# Patient Record
Sex: Male | Born: 1965
Health system: Southern US, Community
[De-identification: ages and names within clinical notes are randomized; demographics above are authoritative.]

## PROBLEM LIST (undated history)

## (undated) DIAGNOSIS — I255 Ischemic cardiomyopathy: Secondary | ICD-10-CM

## (undated) DIAGNOSIS — Z720 Tobacco use: Secondary | ICD-10-CM

## (undated) DIAGNOSIS — J449 Chronic obstructive pulmonary disease, unspecified: Secondary | ICD-10-CM

## (undated) DIAGNOSIS — K219 Gastro-esophageal reflux disease without esophagitis: Secondary | ICD-10-CM

## (undated) DIAGNOSIS — I219 Acute myocardial infarction, unspecified: Secondary | ICD-10-CM

## (undated) DIAGNOSIS — G4733 Obstructive sleep apnea (adult) (pediatric): Secondary | ICD-10-CM

## (undated) DIAGNOSIS — K449 Diaphragmatic hernia without obstruction or gangrene: Secondary | ICD-10-CM

## (undated) DIAGNOSIS — I2510 Atherosclerotic heart disease of native coronary artery without angina pectoris: Secondary | ICD-10-CM

## (undated) DIAGNOSIS — F1210 Cannabis abuse, uncomplicated: Secondary | ICD-10-CM

## (undated) DIAGNOSIS — I10 Essential (primary) hypertension: Secondary | ICD-10-CM

## (undated) HISTORY — DX: Acute myocardial infarction, unspecified: I21.9

## (undated) HISTORY — PX: DENTAL SURGERY: SHX609

## (undated) HISTORY — DX: Chronic obstructive pulmonary disease, unspecified: J44.9

## (undated) HISTORY — PX: NASAL SEPTUM SURGERY: SHX37

---

## 2002-12-23 ENCOUNTER — Ambulatory Visit (HOSPITAL_COMMUNITY): Admission: RE | Admit: 2002-12-23 | Discharge: 2002-12-23 | Payer: Self-pay | Admitting: Pulmonary Disease

## 2002-12-29 ENCOUNTER — Ambulatory Visit (HOSPITAL_COMMUNITY): Admission: RE | Admit: 2002-12-29 | Discharge: 2002-12-29 | Payer: Self-pay | Admitting: Pulmonary Disease

## 2003-01-26 ENCOUNTER — Ambulatory Visit (HOSPITAL_COMMUNITY): Admission: RE | Admit: 2003-01-26 | Discharge: 2003-01-26 | Payer: Self-pay | Admitting: Internal Medicine

## 2003-02-18 ENCOUNTER — Ambulatory Visit (HOSPITAL_COMMUNITY): Admission: RE | Admit: 2003-02-18 | Discharge: 2003-02-18 | Payer: Self-pay | Admitting: Internal Medicine

## 2003-02-20 ENCOUNTER — Ambulatory Visit (HOSPITAL_COMMUNITY): Admission: RE | Admit: 2003-02-20 | Discharge: 2003-02-20 | Payer: Self-pay | Admitting: Internal Medicine

## 2012-03-27 DIAGNOSIS — I219 Acute myocardial infarction, unspecified: Secondary | ICD-10-CM

## 2012-03-27 HISTORY — DX: Acute myocardial infarction, unspecified: I21.9

## 2013-01-13 ENCOUNTER — Encounter (HOSPITAL_COMMUNITY)
Admission: EM | Disposition: A | Discharge status: Home / Self Care | Payer: Self-pay | Source: Home / Self Care | Attending: Cardiovascular Disease

## 2013-01-13 ENCOUNTER — Ambulatory Visit (HOSPITAL_COMMUNITY): Admit: 2013-01-13 | Payer: Self-pay | Admitting: Cardiovascular Disease

## 2013-01-13 ENCOUNTER — Inpatient Hospital Stay (HOSPITAL_COMMUNITY): Payer: Managed Care, Other (non HMO)

## 2013-01-13 ENCOUNTER — Inpatient Hospital Stay (HOSPITAL_COMMUNITY)
Admission: EM | Admit: 2013-01-13 | Discharge: 2013-01-17 | DRG: 237 | Disposition: A | Discharge status: Home / Self Care | Payer: Managed Care, Other (non HMO) | Attending: Cardiovascular Disease | Admitting: Cardiovascular Disease

## 2013-01-13 ENCOUNTER — Emergency Department (HOSPITAL_COMMUNITY): Payer: Managed Care, Other (non HMO)

## 2013-01-13 DIAGNOSIS — Z79899 Other long term (current) drug therapy: Secondary | ICD-10-CM

## 2013-01-13 DIAGNOSIS — I472 Ventricular tachycardia, unspecified: Secondary | ICD-10-CM | POA: Diagnosis present

## 2013-01-13 DIAGNOSIS — Z720 Tobacco use: Secondary | ICD-10-CM | POA: Diagnosis present

## 2013-01-13 DIAGNOSIS — I4901 Ventricular fibrillation: Secondary | ICD-10-CM

## 2013-01-13 DIAGNOSIS — F17200 Nicotine dependence, unspecified, uncomplicated: Secondary | ICD-10-CM | POA: Diagnosis present

## 2013-01-13 DIAGNOSIS — I959 Hypotension, unspecified: Secondary | ICD-10-CM

## 2013-01-13 DIAGNOSIS — I4729 Other ventricular tachycardia: Secondary | ICD-10-CM | POA: Diagnosis present

## 2013-01-13 DIAGNOSIS — Z23 Encounter for immunization: Secondary | ICD-10-CM

## 2013-01-13 DIAGNOSIS — I519 Heart disease, unspecified: Secondary | ICD-10-CM | POA: Insufficient documentation

## 2013-01-13 DIAGNOSIS — I25110 Atherosclerotic heart disease of native coronary artery with unstable angina pectoris: Secondary | ICD-10-CM

## 2013-01-13 DIAGNOSIS — I2119 ST elevation (STEMI) myocardial infarction involving other coronary artery of inferior wall: Secondary | ICD-10-CM

## 2013-01-13 DIAGNOSIS — I10 Essential (primary) hypertension: Secondary | ICD-10-CM | POA: Diagnosis present

## 2013-01-13 DIAGNOSIS — Z7982 Long term (current) use of aspirin: Secondary | ICD-10-CM

## 2013-01-13 DIAGNOSIS — I2510 Atherosclerotic heart disease of native coronary artery without angina pectoris: Secondary | ICD-10-CM

## 2013-01-13 DIAGNOSIS — R570 Cardiogenic shock: Secondary | ICD-10-CM

## 2013-01-13 DIAGNOSIS — I469 Cardiac arrest, cause unspecified: Secondary | ICD-10-CM

## 2013-01-13 DIAGNOSIS — J9620 Acute and chronic respiratory failure, unspecified whether with hypoxia or hypercapnia: Secondary | ICD-10-CM | POA: Diagnosis present

## 2013-01-13 DIAGNOSIS — F1210 Cannabis abuse, uncomplicated: Secondary | ICD-10-CM | POA: Diagnosis present

## 2013-01-13 HISTORY — PX: PERCUTANEOUS CORONARY STENT INTERVENTION (PCI-S): SHX5485

## 2013-01-13 HISTORY — DX: Tobacco use: Z72.0

## 2013-01-13 HISTORY — DX: Obstructive sleep apnea (adult) (pediatric): G47.33

## 2013-01-13 HISTORY — DX: Ischemic cardiomyopathy: I25.5

## 2013-01-13 HISTORY — DX: Diaphragmatic hernia without obstruction or gangrene: K44.9

## 2013-01-13 HISTORY — DX: Cardiogenic shock: R57.0

## 2013-01-13 HISTORY — PX: CORONARY ANGIOPLASTY: SHX604

## 2013-01-13 HISTORY — DX: Essential (primary) hypertension: I10

## 2013-01-13 HISTORY — DX: Gastro-esophageal reflux disease without esophagitis: K21.9

## 2013-01-13 HISTORY — PX: LEFT HEART CATHETERIZATION WITH CORONARY ANGIOGRAM: SHX5451

## 2013-01-13 HISTORY — DX: Atherosclerotic heart disease of native coronary artery without angina pectoris: I25.10

## 2013-01-13 HISTORY — DX: Cannabis abuse, uncomplicated: F12.10

## 2013-01-13 LAB — CBC
HCT: 41.1 % (ref 39.0–52.0)
Hemoglobin: 14 g/dL (ref 13.0–17.0)
MCH: 33.8 pg (ref 26.0–34.0)
MCHC: 34.1 g/dL (ref 30.0–36.0)
MCV: 99.3 fL (ref 78.0–100.0)
Platelets: 230 10*3/uL (ref 150–400)
RBC: 4.14 MIL/uL — ABNORMAL LOW (ref 4.22–5.81)
RDW: 13.4 % (ref 11.5–15.5)
WBC: 20.3 10*3/uL — ABNORMAL HIGH (ref 4.0–10.5)

## 2013-01-13 LAB — POCT I-STAT 3, ART BLOOD GAS (G3+)
Acid-base deficit: 5 mmol/L — ABNORMAL HIGH (ref 0.0–2.0)
Bicarbonate: 22 mEq/L (ref 20.0–24.0)
O2 Saturation: 99 %
Patient temperature: 97.4
TCO2: 23 mmol/L (ref 0–100)
pCO2 arterial: 46.6 mmHg — ABNORMAL HIGH (ref 35.0–45.0)
pH, Arterial: 7.279 — ABNORMAL LOW (ref 7.350–7.450)
pO2, Arterial: 171 mmHg — ABNORMAL HIGH (ref 80.0–100.0)

## 2013-01-13 LAB — GLUCOSE, CAPILLARY
Glucose-Capillary: 100 mg/dL — ABNORMAL HIGH (ref 70–99)
Glucose-Capillary: 109 mg/dL — ABNORMAL HIGH (ref 70–99)

## 2013-01-13 LAB — BASIC METABOLIC PANEL
BUN: 11 mg/dL (ref 6–23)
CO2: 24 mEq/L (ref 19–32)
Calcium: 7.9 mg/dL — ABNORMAL LOW (ref 8.4–10.5)
Chloride: 104 mEq/L (ref 96–112)
Creatinine, Ser: 0.74 mg/dL (ref 0.50–1.35)
GFR calc Af Amer: >90 mL/min (ref >90)
GFR calc non Af Amer: >90 mL/min (ref >90)
Glucose, Bld: 97 mg/dL (ref 70–99)
Potassium: 4.3 mEq/L (ref 3.5–5.1)
Sodium: 139 mEq/L (ref 135–145)

## 2013-01-13 LAB — LIPID PANEL
Cholesterol: 148 mg/dL (ref 0–200)
HDL: 31 mg/dL — ABNORMAL LOW (ref >39)
LDL Cholesterol: 81 mg/dL (ref 0–99)
Total CHOL/HDL Ratio: 4.8 RATIO
Triglycerides: 178 mg/dL — ABNORMAL HIGH (ref <150)
VLDL: 36 mg/dL (ref 0–40)

## 2013-01-13 LAB — BLOOD GAS, ARTERIAL
Acid-base deficit: 2.4 mmol/L — ABNORMAL HIGH (ref 0.0–2.0)
Bicarbonate: 22.6 mEq/L (ref 20.0–24.0)
Drawn by: 249101
FIO2: 0.5 %
MECHVT: 550 mL
O2 Saturation: 98.9 %
PEEP: 5 cmH2O
Patient temperature: 98.6
RATE: 16 resp/min
TCO2: 23.9 mmol/L (ref 0–100)
pCO2 arterial: 43.5 mmHg (ref 35.0–45.0)
pH, Arterial: 7.335 — ABNORMAL LOW (ref 7.350–7.450)
pO2, Arterial: 131 mmHg — ABNORMAL HIGH (ref 80.0–100.0)

## 2013-01-13 LAB — CK TOTAL AND CKMB (NOT AT ARMC)
CK, MB: 416.8 ng/mL (ref 0.3–4.0)
CK, MB: 353.4 ng/mL (ref 0.3–4.0)
Relative Index: 4.8 — ABNORMAL HIGH (ref 0.0–2.5)
Relative Index: 2.9 — ABNORMAL HIGH (ref 0.0–2.5)
Total CK: 8769 U/L — ABNORMAL HIGH (ref 7–232)
Total CK: 12244 U/L — ABNORMAL HIGH (ref 7–232)

## 2013-01-13 LAB — HEPARIN LEVEL (UNFRACTIONATED): Heparin Unfractionated: 0.25 IU/mL — ABNORMAL LOW (ref 0.30–0.70)

## 2013-01-13 LAB — POCT ACTIVATED CLOTTING TIME: Activated Clotting Time: 442 seconds

## 2013-01-13 LAB — TROPONIN I: Troponin I: >20 ng/mL (ref <0.30)

## 2013-01-13 LAB — MRSA PCR SCREENING: MRSA by PCR: NEGATIVE

## 2013-01-13 SURGERY — LEFT HEART CATHETERIZATION WITH CORONARY ANGIOGRAM
Anesthesia: LOCAL

## 2013-01-13 MED ORDER — TICAGRELOR 90 MG PO TABS
90.0000 mg | ORAL_TABLET | Freq: Two times a day (BID) | ORAL | Status: DC
  Administered 2013-01-13 – 2013-01-17 (×8): 90 mg via ORAL
  Filled 2013-01-13 (×9): qty 1

## 2013-01-13 MED ORDER — SODIUM CHLORIDE 0.9 % IV SOLN: INTRAVENOUS | Status: DC

## 2013-01-13 MED ORDER — TICAGRELOR 90 MG PO TABS
ORAL_TABLET | ORAL | Status: AC
  Filled 2013-01-13: qty 2

## 2013-01-13 MED ORDER — BIOTENE DRY MOUTH MT LIQD
15.0000 mL | Freq: Four times a day (QID) | OROMUCOSAL | Status: DC
  Administered 2013-01-13 – 2013-01-14 (×4): 15 mL via OROMUCOSAL

## 2013-01-13 MED ORDER — ALBUTEROL SULFATE (5 MG/ML) 0.5% IN NEBU
2.5000 mg | INHALATION_SOLUTION | Freq: Four times a day (QID) | RESPIRATORY_TRACT | Status: DC
  Administered 2013-01-13 – 2013-01-14 (×6): 2.5 mg via RESPIRATORY_TRACT
  Filled 2013-01-13 (×6): qty 0.5

## 2013-01-13 MED ORDER — ATORVASTATIN CALCIUM 80 MG PO TABS
80.0000 mg | ORAL_TABLET | Freq: Every day | ORAL | Status: DC
  Administered 2013-01-13 – 2013-01-16 (×4): 80 mg via ORAL
  Filled 2013-01-13 (×5): qty 1

## 2013-01-13 MED ORDER — ONDANSETRON HCL 4 MG/2ML IJ SOLN: 4.0000 mg | Freq: Four times a day (QID) | INTRAMUSCULAR | Status: DC | PRN

## 2013-01-13 MED ORDER — IPRATROPIUM BROMIDE 0.02 % IN SOLN
0.5000 mg | Freq: Four times a day (QID) | RESPIRATORY_TRACT | Status: DC
  Administered 2013-01-13 – 2013-01-14 (×6): 0.5 mg via RESPIRATORY_TRACT
  Filled 2013-01-13 (×6): qty 2.5

## 2013-01-13 MED ORDER — CHLORHEXIDINE GLUCONATE 0.12 % MT SOLN
15.0000 mL | Freq: Two times a day (BID) | OROMUCOSAL | Status: DC
  Administered 2013-01-13 – 2013-01-14 (×3): 15 mL via OROMUCOSAL
  Filled 2013-01-13 (×3): qty 15

## 2013-01-13 MED ORDER — HEPARIN (PORCINE) IN NACL 2-0.9 UNIT/ML-% IJ SOLN
INTRAMUSCULAR | Status: AC
  Filled 2013-01-13: qty 1000

## 2013-01-13 MED ORDER — PANTOPRAZOLE SODIUM 40 MG IV SOLR
40.0000 mg | Freq: Every day | INTRAVENOUS | Status: DC
  Administered 2013-01-13 – 2013-01-14 (×2): 40 mg via INTRAVENOUS
  Filled 2013-01-13 (×3): qty 40

## 2013-01-13 MED ORDER — SUCCINYLCHOLINE CHLORIDE 20 MG/ML IJ SOLN
INTRAMUSCULAR | Status: AC | PRN
  Administered 2013-01-13: 120 mg via INTRAVENOUS

## 2013-01-13 MED ORDER — CEFAZOLIN SODIUM-DEXTROSE 2-3 GM-% IV SOLR
2.0000 g | INTRAVENOUS | Status: AC
  Administered 2013-01-13: 2 g via INTRAVENOUS
  Filled 2013-01-13: qty 50

## 2013-01-13 MED ORDER — LIDOCAINE HCL (PF) 1 % IJ SOLN
INTRAMUSCULAR | Status: AC
  Filled 2013-01-13: qty 30

## 2013-01-13 MED ORDER — BIVALIRUDIN 250 MG IV SOLR
INTRAVENOUS | Status: AC
  Filled 2013-01-13: qty 250

## 2013-01-13 MED ORDER — SODIUM CHLORIDE 0.9 % IV SOLN
2.0000 mg/h | INTRAVENOUS | Status: DC
  Administered 2013-01-13: 2 mg/h via INTRAVENOUS
  Filled 2013-01-13 (×2): qty 10

## 2013-01-13 MED ORDER — SODIUM CHLORIDE 0.9 % IV SOLN
INTRAVENOUS | Status: AC
  Administered 2013-01-13: 1000 mL via INTRAVENOUS
  Administered 2013-01-13 (×2): 100 mL/h via INTRAVENOUS

## 2013-01-13 MED ORDER — MAGNESIUM SULFATE 50 % IJ SOLN
INTRAMUSCULAR | Status: AC | PRN
  Administered 2013-01-13: 2 g via INTRAVENOUS

## 2013-01-13 MED ORDER — PROPOFOL 10 MG/ML IV EMUL
5.0000 ug/kg/min | INTRAVENOUS | Status: DC
  Administered 2013-01-13: 40 ug/kg/min via INTRAVENOUS
  Administered 2013-01-13: 30 ug/kg/min via INTRAVENOUS
  Administered 2013-01-13: 40 ug/kg/min via INTRAVENOUS
  Administered 2013-01-14 (×2): 30 ug/kg/min via INTRAVENOUS
  Filled 2013-01-13 (×6): qty 100

## 2013-01-13 MED ORDER — MIDAZOLAM HCL 5 MG/5ML IJ SOLN
INTRAMUSCULAR | Status: DC | PRN
  Administered 2013-01-13: 2 mg via INTRAVENOUS

## 2013-01-13 MED ORDER — ETOMIDATE 2 MG/ML IV SOLN
INTRAVENOUS | Status: AC | PRN
  Administered 2013-01-13: 20 mg via INTRAVENOUS

## 2013-01-13 MED ORDER — ATROPINE SULFATE 1 MG/ML IJ SOLN
INTRAMUSCULAR | Status: DC | PRN
  Administered 2013-01-13: 1 mg via INTRAVENOUS

## 2013-01-13 MED ORDER — SODIUM CHLORIDE 0.9 % IV SOLN
25.0000 ug/h | INTRAVENOUS | Status: DC
  Administered 2013-01-13: 100 ug/h via INTRAVENOUS
  Filled 2013-01-13: qty 50

## 2013-01-13 MED ORDER — ASPIRIN 81 MG PO CHEW
81.0000 mg | CHEWABLE_TABLET | Freq: Every day | ORAL | Status: DC
  Administered 2013-01-13 – 2013-01-17 (×5): 81 mg via ORAL
  Filled 2013-01-13 (×5): qty 1

## 2013-01-13 MED ORDER — DEXTROSE 5 % IV SOLN: 300.0000 mg/h | Freq: Once | INTRAVENOUS | Status: DC

## 2013-01-13 MED ORDER — DEXTROSE 5 % IV SOLN
300.0000 mg | Freq: Once | INTRAVENOUS | Status: AC
  Administered 2013-01-13: 300 mg via INTRAVENOUS

## 2013-01-13 MED ORDER — HEPARIN (PORCINE) IN NACL 100-0.45 UNIT/ML-% IJ SOLN
1200.0000 [IU]/h | INTRAMUSCULAR | Status: DC
  Administered 2013-01-13 (×2): 1200 [IU]/h via INTRAVENOUS
  Filled 2013-01-13 (×5): qty 250

## 2013-01-13 MED ORDER — FENTANYL BOLUS VIA INFUSION
25.0000 ug | Freq: Four times a day (QID) | INTRAVENOUS | Status: DC | PRN
  Filled 2013-01-13: qty 100

## 2013-01-13 MED ORDER — DOPAMINE-DEXTROSE 3.2-5 MG/ML-% IV SOLN
2.0000 ug/kg/min | Freq: Once | INTRAVENOUS | Status: DC
  Filled 2013-01-13: qty 250

## 2013-01-13 MED ORDER — NITROGLYCERIN 0.2 MG/ML ON CALL CATH LAB
INTRAVENOUS | Status: AC
  Filled 2013-01-13: qty 1

## 2013-01-13 MED ORDER — PROPOFOL 10 MG/ML IV EMUL
INTRAVENOUS | Status: AC
  Filled 2013-01-13: qty 100

## 2013-01-13 MED ORDER — DOPAMINE-DEXTROSE 3.2-5 MG/ML-% IV SOLN
INTRAVENOUS | Status: DC | PRN
  Administered 2013-01-13: 10 ug/kg/min via INTRAVENOUS
  Administered 2013-01-13: 800 mg via INTRAVENOUS

## 2013-01-13 MED ORDER — HYDROCORTISONE SOD SUCCINATE 100 MG IJ SOLR
50.0000 mg | Freq: Four times a day (QID) | INTRAMUSCULAR | Status: DC
  Administered 2013-01-13 – 2013-01-15 (×8): 50 mg via INTRAVENOUS
  Filled 2013-01-13 (×12): qty 1

## 2013-01-13 MED FILL — Medication: Qty: 1 | Status: AC

## 2013-01-13 NOTE — Progress Notes (Signed)
*   Echocardiogram 2D Echocardiogram has been performed.  Dorothey Baseman 01/13/2013, 1:14 PM

## 2013-01-13 NOTE — Progress Notes (Signed)
Called cardiology Dr. On call to report pt having episodes of Sinus Brady, rate as low as 40. Reported that I increased the dopamine dose, and that HR is not sustained in the 40's, but does occasionally drop for several minutes at a time before returning to the 50's-60's. Will continue to monitor.

## 2013-01-13 NOTE — Consult Note (Signed)
PULMONARY  / CRITICAL CARE MEDICINE  Name: Timothy Walker MRN: [id number] DOB: 1966-01-16    ADMISSION DATE:  01/13/2013 CONSULTATION DATE:  10-20  REFERRING MD :  Kirke Corin PRIMARY SERVICE: Cards  CHIEF COMPLAINT:  Substernal chest pain  BRIEF PATIENT DESCRIPTION:  47 yo smoker who presented to [facility] in [place] 10-20 with chest pain and 12 lead revealed a inferior MI. He was given asa and heparin and transported to [facility] ED. He promptly developed refractory VT, shocked x 15, intubated and taken to Cath Lab and RCA was stented. He had profound shock and required pressors and IABP. PCCM asked to manage vent. SIGNIFICANT EVENTS / STUDIES:  10-20 VT arrest RCA thrombus and  Stent placement  LINES / TUBES: 10-20 rt fem IABP>>  CULTURES:   ANTIBIOTICS:   HISTORY OF PRESENT ILLNESS:   47 yo smoker who presented to [facility] in [place] 10-20 with chest pain and 12 lead revealed a inferior MI. He was given asa and heparin and transported to [facility] ED. He promptly developed refractory VT, shocked x 15, intubated and taken to Cath Lab and RCA was stented. He had profound shock and required pressors and IABP. PCCM asked to manage vent.  PAST MEDICAL HISTORY :  No past medical history on file. No past surgical history on file. Prior to Admission medications   Not on File   No Known Allergies  FAMILY HISTORY:  No family history on file. SOCIAL HISTORY:  has no tobacco, alcohol, and drug history on file.  REVIEW OF SYSTEMS:  NA  SUBJECTIVE:   VITAL SIGNS: Temp:  [97.5 F (36.4 C)] 97.5 F (36.4 C) (10/20 0600) Pulse Rate:  [29-99] 78 (10/20 0839) Resp:  [16-27] 16 (10/20 0839) BP: (59-159)/(48-123) 106/78 mmHg (10/20 0839) SpO2:  [95 %-100 %] 100 % (10/20 0839) FiO2 (%):  [50 %-100 %] 50 % (10/20 0839) Weight:  [195 lb (88.451 kg)] 195 lb (88.451 kg) (10/20 0322) HEMODYNAMICS:   VENTILATOR SETTINGS: Vent Mode:  [-] PRVC FiO2 (%):  [50 %-100 %] 50 % Set  Rate:  [16 bmp] 16 bmp Vt Set:  [550 mL] 550 mL PEEP:  [5 cmH20] 5 cmH20 Plateau Pressure:  [16 cmH20-18 cmH20] 16 cmH20 INTAKE / OUTPUT: Intake/Output     10/19 0701 - 10/20 0700 10/20 0701 - 10/21 0700   I.V. (mL/kg) 117.8 (1.3) 119 (1.3)   IV Piggyback 100    Total Intake(mL/kg) 217.8 (2.5) 119 (1.3)   Urine (mL/kg/hr) 975 250 (1.5)   Total Output 975 250   Net -757.2 -131.1          PHYSICAL EXAMINATION: General:  WDWNWM sedated on vent Neuro: sedated on vent. No down time HEENT:  Ott-<vent                 OGT-> clamped Cardiovascular: HSR RR IABP sound noted Lungs:  CTA Abdomen:  Faint bs, soft Musculoskeletal:  Rt fem iabp, rt foot cooler than left Skin:  cool  LABS:  CBC No results found for this basename: WBC, HGB, HCT, PLT,  in the last 72 hours Coag's No results found for this basename: APTT, INR,  in the last 72 hours BMET Recent Labs     01/13/13  0715  NA  139  K  4.3  CL  104  CO2  24  BUN  11  CREATININE  0.74  GLUCOSE  97   Electrolytes Recent Labs     01/13/13  0715  CALCIUM  7.9*   Sepsis Markers No results found for this basename: LACTICACIDVEN, PROCALCITON, O2SATVEN,  in the last 72 hours ABG Recent Labs     01/13/13  0622  PHART  7.279*  PCO2ART  46.6*  PO2ART  171.0*   Liver Enzymes No results found for this basename: AST, ALT, ALKPHOS, BILITOT, ALBUMIN,  in the last 72 hours Cardiac Enzymes No results found for this basename: TROPONINI, PROBNP,  in the last 72 hours Glucose No results found for this basename: GLUCAP,  in the last 72 hours  Imaging Portable Chest Xray  01/13/2013   *RADIOLOGY REPORT*  Clinical Data: Cardiac arrest, new intubation.  PORTABLE CHEST - 1 VIEW  Comparison: Chest radiograph January 13, 2013 at 03:15 a.m.  Findings: Cardiac silhouette appears mildly enlarged.  Mild bibasilar air space opacities with air bronchograms in the left retrocardiac space.  Small left pleural effusion.  Endotracheal tube  tip projects 2.7 cm above the carina. Nasogastric tube looped in the stomach, distal tip projecting in the cardia.  A linear 12 mm radiopaque foreign body projects in the descending aorta.  Soft tissue planes and included osseous structures are not suspicious.  IMPRESSION: Borderline cardiomegaly, with increasing mild bibasilar air space opacities, small retrocardiac air bronchogram which may reflect pneumonia/aspiration.  Small left pleural effusion.  Endotracheal tube tip projects 2.7 cm above the carina, similar. Nasogastric tube tip projects in the gastric cardia.  Linear radiopaque foreign bodies projecting in the descending aorta suggest aortic balloon pump projecting 5 cm below the aortic knob.   Original Report Authenticated By: Awilda Metro   Dg Chest Portable 1 View  01/13/2013   *RADIOLOGY REPORT*  Clinical Data: Post CPR  PORTABLE CHEST - 1 VIEW  Comparison: Chest radiograph January 13, 2013 at 0213 hours  Findings: Interval intubation, distal tip projects 2.5 cm above the carina.  Nasogastric tube in place, distal tip not imaged at least past the distal esophagus.  Borderline cardiomegaly.  Increasing mild central pulmonary vascular congestion and mild interstitial prominence without pleural effusions or focal consolidations.  No pneumothorax.  Multiple EKG lines overlay the patient and could obscure underlying subtle pathology.  Soft tissue planes and included osseous structures are not suspicious.  IMPRESSION: Endotracheal tube tip projects 2.5 cm above the carina; nasogastric tube past the distal esophagus though, tip not imaged.  Borderline cardiomegaly with increasing mild to moderate pulmonary edema.   Original Report Authenticated By: Awilda Metro     CXR: see above  Active Problems:   Paroxysmal ventricular tachycardia   Acute and chronic respiratory failure   Tobacco abuse   Heart disease, unspecified   Cardiogenic shock   ASSESSMENT / PLAN:  PULMONARY A: VDRF due  to agitation and shock Tobacco abuse P:   - Hold weaning while hypotensive and with IABP. - BD's as ordered. - Adjust vent for ABG. - ABG now. - Smoking cessation post extubation.  CARDIOVASCULAR A:  Post VT arrest with CC and stent of RCA Cardiogenic shock P:  - Pressors per cards (no cvl), switch dopa to venous sheath and wean - IABP per cards, will attempt to remove by AM. - Consider CVL(will need heparin off x 30 mins) but will not place while on low dose dopamine and sheath is present. - ? A line if shock prolonged. - Heparin per cards.  RENAL Lab Results  Component Value Date   CREATININE 0.74 01/13/2013    A:  No acute issue  P:   -  IVF as ordered. - BMET in AM.  GASTROINTESTINAL A: GI protection P:   - PPI - NPO, if not extubatable by AM then will start TF.  HEMATOLOGIC A:  No acute issue  P:  - CBC in AM.  INFECTIOUS A:  No acute issue  P:   - Monitor fever curve and WBC, no abx for now.  ENDOCRINE A:  No acute issue  P:   - Monitor CBGs. - Check cortisol level. - Replace with stress dose steroids, but d/c if level is elevated.  NEUROLOGIC A:  Sedated on vent, agitated prior to CC. P:   - WUA. - CT head if AMS on wake up assessment.  TODAY'S SUMMARY: Check gas, adjust vent, no weaning until more hemodynamically stable.  47 yo smoker who presented to [facility] in [place] 10-20 with chest pain and 12 lead revealed a inferior MI. He was given asa and heparin and transported to [facility] ED. He promptly developed refractory VT, shocked x 15, intubated and taken to Cath Lab and RCA was stented. He had profound shock and required pressors and IABP. PCCM asked to manage vent.  CC time 45 minutes.  Alyson Reedy, M.D. [facility] Pulmonary/Critical Care Medicine. Pager: [phone]. After hours pager: [phone].

## 2013-01-13 NOTE — Progress Notes (Signed)
    Subjective:  Intubated and sedated.  Objective:  Vital Signs in the last 24 hours: Temp:  [97.5 F (36.4 C)] 97.5 F (36.4 C) (10/20 0600) Pulse Rate:  [29-99] 79 (10/20 0700) Resp:  [16-27] 16 (10/20 0700) BP: (59-159)/(48-123) 119/86 mmHg (10/20 0700) SpO2:  [95 %-100 %] 100 % (10/20 0700) FiO2 (%):  [60 %-100 %] 60 % (10/20 0700) Weight:  [195 lb (88.451 kg)] 195 lb (88.451 kg) (10/20 0322)  Intake/Output from previous day: 10/19 0701 - 10/20 0700 In: 217.8 [I.V.:117.8; IV Piggyback:100] Out: 975 [Urine:975]  Physical Exam: Pt is intubated, sedated. Neck: JVP - normal, carotids 2+= without bruits Lungs: CTA bilaterally CV: RRR without murmur or gallop Abd: soft, positive BS Ext: no C/C/E, distal pulses intact and equal, right groin sites are stable Skin: warm/dry no rash  Lab Results: No results found for this basename: WBC, HGB, PLT,  in the last 72 hours No results found for this basename: NA, K, CL, CO2, GLUCOSE, BUN, CREATININE,  in the last 72 hours No results found for this basename: TROPONINI, CK, MB,  in the last 72 hours  Cardiac Studies: 2D Echo pending  Tele: Sinus rhythm, personally reviewed.  EKG: NSR with acute inferoposterior MI (STEMI)  Assessment/Plan:  1. Inferior STEMI - s/p PCI of the RCA. On ASA/Brilinta/Lipitor 80 mg.  2. Cardiogenic shock, acute. Support with IABP and low-dose dopamine. Continue same and plan wean IABP over next 24 hours if tolerated will remove in am. Continue IV heparin while IABP in place.   PLAN: order 2D Echo, continue current support. CCM to manage vent. Serial enzymes/labs. If remains stable on IABP will remove in am. Probably decrease IABP augmentation to 1:2 this afternoon.  Tonny Bollman, M.D. 01/13/2013, 8:06 AM

## 2013-01-13 NOTE — ED Notes (Signed)
Pt being wheeled to Cath lab went into Vfib in hallway. Compressions began and 200 joule shock given in hallway. Pt transferred to Trauma room C at 0303

## 2013-01-13 NOTE — Progress Notes (Signed)
CRITICAL VALUE ALERT  Critical value received:  CKMB, and troponin  Date of notification:  01/13/2013  Time of notification: 10:44 AM   Critical value read back:no  Nurse who received alert:  Toula Moos   MD notified (1st page):  MD not notified, value expected  Time of first page:    MD notified (2nd page):  Time of second page:  Responding MD:    Time MD responded:

## 2013-01-13 NOTE — Progress Notes (Signed)
ANTICOAGULATION CONSULT NOTE - Initial Consult  Pharmacy Consult for Heparin Indication: IABP  No Known Allergies  Patient Measurements: Height: 5\' 10"  (177.8 cm) Weight: 195 lb (88.451 kg) IBW/kg (Calculated) : 73  Vital Signs: BP: 59/48 mmHg (10/20 0322) Pulse Rate: 29 (10/20 0337)  Labs: No results found for this basename: HGB, HCT, PLT, APTT, LABPROT, INR, HEPARINUNFRC, CREATININE, CKTOTAL, CKMB, TROPONINI,  in the last 72 hours  CrCl is unknown because no creatinine reading has been taken.   Medical History: Hypertension No past medical history on file.  Medications:  Ziac  Assessment: 47 yo male s/p STEMI, now on IABP, for heparin  Goal of Therapy:  Heparin level 0.2-0.5 Monitor platelets by anticoagulation protocol: Yes   Plan:  Start heparin 1200 units/hr Check heparin level in 8 hours.  Madalaine Portier, Gary Fleet 01/13/2013,5:48 AM

## 2013-01-13 NOTE — Progress Notes (Signed)
Pt. Was transported from the CATH lab to 2H12 without any complications.

## 2013-01-13 NOTE — ED Notes (Signed)
300mg  Amiodarone Administered bolus IV push by Kingman Cellar RN per Evangeline Gula MD

## 2013-01-13 NOTE — CV Procedure (Signed)
Cardiac Catheterization Procedure Note  Name: HARDIE VELTRE MRN: [id number] DOB: 11-Dec-1965  Procedure: Left Heart Cath, Selective Coronary Angiography, LV angiography,  PTCA/Stent of distal right coronary artery, intra-aortic balloon pump placement, temporary transvenous pacemaker placement and removal at the end of the case.  Indication: This is a 47 year old male with no previous cardiac history. He has prolonged history of tobacco use. He presented to [facility] emergency room with acute onset of chest pain. He was found to have inferior ST elevation and was transferred for emergent cardiac catheterization. After he arrived in our emergency room, he went into refractory ventricular fibrillation which required a minimal of 15 shocks with restoration of normal sinus rhythm. He was given amiodarone. He was intubated for airway protection. He continued to be bradycardic and hypotensive. He was given atropine and was started on dopamine but continued to be hypotensive.   Medications:   Contrast:  105 ml Omnipaque  Diagnostic Procedure Details: The right groin was prepped, draped, and anesthetized with 1% lidocaine. Using the modified Seldinger technique, a 6 French sheath was introduced into the right femoral artery and a 6 French sheath was placed in the right femoral vein. A 6 French sheath in the femoral artery was exchanged into a 7 French intra-aortic balloon pump sheath. Intra-aortic balloon pump was placed and started with good hemodynamic support. Then a temporary transvenous pacemaker was advanced via the femoral vein and placed in the right ventricle. This was used with a backup rate of 50 beats per minute.  I then placed a 6 French in the left femoral artery . Standard Judkins catheters were used for selective coronary angiography and left ventriculography. Catheter exchanges were performed over a wire.    Procedural Findings:  Hemodynamics: AO:   119/82   mmHg LV:   119/15      mmHg LVEDP: 28   mmHg  Coronary angiography: Coronary dominance: right    Left Main:   normal   Left Anterior Descending (LAD):   normal in size with minor irregularities throughout its course but no evidence of obstructive disease.   1st diagonal (D1):   small in size with 50% ostial stenosis.   2nd diagonal (D2):   small in size with minor irregularities.   3rd diagonal (D3):   very small in size.   Circumflex (LCx):   normal in size and nondominant. There is an 80-90% mid stenosis at the origin of OM 2.   1st obtuse marginal:   small in size with minor irregularities.   2nd obtuse marginal:   normal in size with no significant disease.   3rd obtuse marginal:   medium in size with no significant disease.    Ramus Intermedius:  normal in size with 20% proximal stenosis.   Right Coronary Artery:  large in size and dominant. There is 50% proximal stenosis. The vessel is occluded distally with large thrombus burden.  Posterior descending artery:  normal  Posterior AV segment:  normal   Posterolateral branchs:   normal  Left ventriculography: Left ventricular systolic function is  normal , LVEF is estimated at  55  %, there is  no  significant mitral regurgitation .  mild inferior wall hypokinesis   PCI Procedure Note:  Following the diagnostic procedure, the decision was made to proceed with PCI. Weight-based bivalirudin was given for anticoagulation. Brilinta 180 mg was crushed and given through the OG tube.  Once a therapeutic ACT was achieved, a 6 [place] JR 4  guide catheter was inserted.  A  intuition  coronary guidewire was used to cross the lesion.  aspiration thrombectomy was performed with a Pronto catheter with retrieval of a large thrombus.   The lesion was then stented with a  3.5 x 8 mm Xience expedition drug-eluting  stent.  The stent was postdilated with a  4.0 x 15 mm  noncompliant balloon.  Following PCI, there was 0% residual stenosis and TIMI-3 flow. Final  angiography confirmed an excellent result. Femoral hemostasis was achieved with Perclose device .  throughout the case, the patient was agitated which made the procedure very difficult. He was given escalating doses of propofol and Versed. He was hypotensive on presentation and was started on dopamine and Levophed.  A transvenous pacemaker was removed at the end of the case. The intra-aortic balloon pump was secured in place. The patient left the cath lab in critical but improved condition. I was able to stop Levophed.    PC  Data: Vessel - distal RCA/Segment -  3  Percent Stenosis (pre)   100% TIMI-flow 0  Stent  3.5 x 18 mm Xience expedition drug-eluting stent  Percent Stenosis (post)  0%  TIMI-flow (post)  3   Final Conclusions:   1. Refractory ventricular fibrillation and cardiogenic shock due to inferior ST elevation myocardial infarction. Occluded distal right coronary artery. There is significant bifurcation disease in the mid left circumflex supplying an overall small to medium sized territory.   2. Profound hypotension on presentation with subsequent improvement with revascularization and intra-aortic balloon pump placement. 3. Normal LV systolic function with moderately elevated left ventricular end-diastolic pressure. 4. Successful PCI and drug-eluting stent placement to the right coronary artery.  Recommendations:   continue dual antiplatelet therapy. Start heparin drip while on intra-aortic balloon pump which can likely be weaned off later today. Critical-care consult for vent management. Wean off dopamine as tolerated.   Lorine Bears MD, [facility] 01/13/2013, 5:22 AM

## 2013-01-13 NOTE — Progress Notes (Signed)
ANTICOAGULATION CONSULT NOTE - Initial Consult  Pharmacy Consult for Heparin Indication: IABP  No Known Allergies  Patient Measurements: Height: 5\' 10"  (177.8 cm) Weight: 195 lb (88.451 kg) IBW/kg (Calculated) : 73  Vital Signs: Temp: 98.4 F (36.9 C) (10/20 1500) Temp src: Axillary (10/20 0600) BP: 97/74 mmHg (10/20 1500) Pulse Rate: 58 (10/20 1600)  Labs:  Recent Labs  01/13/13 0715 01/13/13 0815 01/13/13 0816 01/13/13 1400 01/13/13 1500  HGB  --  14.0  --   --   --   HCT  --  41.1  --   --   --   PLT  --  230  --   --   --   HEPARINUNFRC  --   --   --  0.25*  --   CREATININE 0.74  --   --   --   --   CKTOTAL  --   --  8769*  --  PENDING  CKMB  --   --  416.8*  --  353.4*  TROPONINI  --   --  >20.00*  --   --     Estimated Creatinine Clearance: 129.3 ml/min (by C-G formula based on Cr of 0.74).   Medical History: Hypertension No past medical history on file.  Medications:  Ziac  Assessment: 47 yo male s/p STEMI, now on IABP. Heparin gtt at goal for indication. No bleeding issues noted. CBC within normal limits.  Goal of Therapy:  Heparin level 0.2-0.5 Monitor platelets by anticoagulation protocol: Yes   Plan:  Continue heparin 1200 units/hr Check heparin level in am  Timothy Walker 01/13/2013,4:20 PM

## 2013-01-13 NOTE — H&P (Signed)
History and Physical  Patient ID: Timothy Walker MRN: [id number] DOB/AGE: 46-Apr-1967 46 y.o. Admit date: 01/13/2013  Primary Care Physician: No primary provider on file. Primary Cardiologist : New  HPI: This is a 47 year old male with no previous cardiac history. He has prolonged history of tobacco use. He presented to [facility] emergency room with acute onset of substernal chest tightness. He was found to have inferior ST elevation on his EKG. He was given aspirin as well as heparin. He was transferred for emergent cardiac catheterization. As soon as he arrived in our emergency room, he went into ventricular fibrillation which required a minimum of 15 shocks. He was given IV amiodarone. He became hypotensive and bradycardic. He was intubated, given atropine and was started on dopamine. He continued to be in unstable condition and was transferred to the cath lab for further management. I spoke with his father and updated him before and after the case. I could not obtain any history from the patient but according to the father he takes only a blood pressure medication and not aware of any allergies. He has a primary care physician in [place] but he doesn't name. He works in [employer] with no history of alcohol or recreational drug use.    Review of systems : not able to be obtained. Intubated and sedated.     No past medical history on file.  No family history on file.  History   Social History  . Marital Status: Single    Spouse Name: N/A    Number of Children: N/A  . Years of Education: N/A   Occupational History  . Not on file.   Social History Main Topics  . Smoking status: Not on file  . Smokeless tobacco: Not on file  . Alcohol Use: Not on file  . Drug Use: Not on file  . Sexual Activity: Not on file   Other Topics Concern  . Not on file   Social History Narrative  . No narrative on file    No past surgical history on file.   No prescriptions prior to  admission    Physical Exam: Blood pressure 59/48, pulse 29, resp. rate 27, height 5\' 10"  (1.778 m), weight 88.451 kg (195 lb), SpO2 100.00%.   Constitutional: Intubated and sedated. He was in extreme distress before intubation. HENT: No nasal discharge.  Head: Normocephalic and atraumatic.  Eyes: Pupils are equal and round.  No discharge. Neck: Normal range of motion. Neck supple. No JVD present. No thyromegaly present.  Cardiovascular: Bradycardic on presentation, regular rhythm, normal heart sounds. Exam reveals no gallop and no friction rub. No murmur heard.  Pulmonary/Chest: Severe respiratory distress.  Abdominal: Soft. Bowel sounds are normal. He exhibits no distension. There is no tenderness. There is no rebound and no guarding.  Musculoskeletal:  He exhibits no edema and no tenderness.  Neurological: Could not be evaluated. Skin: Skin is warm and dry. No rash noted. He is not diaphoretic. No erythema. No pallor.  Psychiatric: Could not be evaluated.      Labs:   No results found for this basename: WBC, HGB, HCT, MCV, PLT      Radiology: Dg Chest Portable 1 View  01/13/2013   *RADIOLOGY REPORT*  Clinical Data: Post CPR  PORTABLE CHEST - 1 VIEW  Comparison: Chest radiograph January 13, 2013 at 0213 hours  Findings: Interval intubation, distal tip projects 2.5 cm above the carina.  Nasogastric tube in place, distal tip not imaged at least  past the distal esophagus.  Borderline cardiomegaly.  Increasing mild central pulmonary vascular congestion and mild interstitial prominence without pleural effusions or focal consolidations.  No pneumothorax.  Multiple EKG lines overlay the patient and could obscure underlying subtle pathology.  Soft tissue planes and included osseous structures are not suspicious.  IMPRESSION: Endotracheal tube tip projects 2.5 cm above the carina; nasogastric tube past the distal esophagus though, tip not imaged.  Borderline cardiomegaly with increasing mild to  moderate pulmonary edema.   Original Report Authenticated By: Awilda Metro    EKG: Sinus rhythm with inferior ST elevation  ASSESSMENT AND PLAN:   1. Refractory ventricular fibrillation and cardiogenic shock due to inferior ST elevation myocardial infarction. Occluded distal right coronary artery: Continue to monitor. Start a beta blocker once he is off vasopressors. No antiarrhythmic medication at this time. If he continues to improve at this rate, the intra-aortic balloon pump can likely be removed later today given that his LV systolic function was normal after revascularization.  2. acute inferior ST elevation myocardial infarction:  Continue dual antiplatelet therapy with aspirin and Brilinta.  Continue heparin drip while on intra-aortic balloon pump. Obtain an echocardiogram. Start a beta blocker once hypotension resolves. High-dose statin.  3. Respiratory failure: Consult critical care for vent management and extubation.  4. Tobacco use: The patient will need to be counseled about smoking cessation.   Signed:  Lorine Bears MD, [facility] 01/13/2013, 5:42 AM

## 2013-01-13 NOTE — Care Management Note (Addendum)
    Page 1 of 1   01/17/2013     10:46:18 AM   CARE MANAGEMENT NOTE 01/17/2013  Patient:  Timothy Walker, Timothy Walker   Account Number:  [id number]  Date Initiated:  01/13/2013  Documentation initiated by:  Junius Creamer  Subjective/Objective Assessment:   adm w mi     Action/Plan:   lives w friend   Anticipated DC Date:     Anticipated DC Plan:    In-house referral  Artist      DC Planning Services  CM consult  Medication Assistance      Choice offered to / List presented to:             Status of service:  Completed, signed off Medicare Important Message given?   (If response is "NO", the following Medicare IM given date fields will be blank) Date Medicare IM given:   Date Additional Medicare IM given:    Discharge Disposition:  HOME/SELF CARE  Per UR Regulation:  Reviewed for med. necessity/level of care/duration of stay  If discussed at Long Length of Stay Meetings, dates discussed:    Comments:  01-17-13-1034 1034 Timothy Bamberger, RN, BSN [phone] Pt plan for d/c today with brilinta. CM did call [facility] in [place] to make sure medication is available and it is in stock with copay of 18.00. No further needs from CM at this time.  10/21 1029a Timothy dowell rn,bsn spoke w pt and family. gave pt brilinta 30day free and copay assist card. pt has [place] ins thru [employer]. alerted ins verifyers of ins.

## 2013-01-13 NOTE — ED Notes (Signed)
Dopamine administration  Listed at 0334 started at [phone]

## 2013-01-13 NOTE — Progress Notes (Signed)
Chaplain responded to code stemi and subsequent code blue. Chaplain sat with patient's father who was by himself, then escorted him to the cath lab. Chaplain offered a prayer, brought him coffee, and obtained an update from the cath lab.

## 2013-01-14 ENCOUNTER — Inpatient Hospital Stay (HOSPITAL_COMMUNITY): Payer: Managed Care, Other (non HMO)

## 2013-01-14 DIAGNOSIS — R570 Cardiogenic shock: Secondary | ICD-10-CM

## 2013-01-14 LAB — CORTISOL: Cortisol, Plasma: 11.4 ug/dL

## 2013-01-14 LAB — BLOOD GAS, ARTERIAL
Acid-base deficit: 1.2 mmol/L (ref 0.0–2.0)
Bicarbonate: 23.5 mEq/L (ref 20.0–24.0)
Drawn by: 331761
FIO2: 0.4 %
MECHVT: 550 mL
O2 Saturation: 97.8 %
PEEP: 5 cmH2O
Patient temperature: 98.6
RATE: 16 resp/min
TCO2: 24.9 mmol/L (ref 0–100)
pCO2 arterial: 42.9 mmHg (ref 35.0–45.0)
pH, Arterial: 7.358 (ref 7.350–7.450)
pO2, Arterial: 98.2 mmHg (ref 80.0–100.0)

## 2013-01-14 LAB — CBC
HCT: 40.1 % (ref 39.0–52.0)
Hemoglobin: 13.9 g/dL (ref 13.0–17.0)
MCH: 35.1 pg — ABNORMAL HIGH (ref 26.0–34.0)
MCHC: 34.7 g/dL (ref 30.0–36.0)
MCV: 101.3 fL — ABNORMAL HIGH (ref 78.0–100.0)
Platelets: 203 10*3/uL (ref 150–400)
RBC: 3.96 MIL/uL — ABNORMAL LOW (ref 4.22–5.81)
RDW: 13.7 % (ref 11.5–15.5)
WBC: 17.3 10*3/uL — ABNORMAL HIGH (ref 4.0–10.5)

## 2013-01-14 LAB — BASIC METABOLIC PANEL
BUN: 7 mg/dL (ref 6–23)
CO2: 24 mEq/L (ref 19–32)
Calcium: 8 mg/dL — ABNORMAL LOW (ref 8.4–10.5)
Chloride: 103 mEq/L (ref 96–112)
Creatinine, Ser: <0.2 mg/dL — ABNORMAL LOW (ref 0.50–1.35)
Glucose, Bld: 177 mg/dL — ABNORMAL HIGH (ref 70–99)
Potassium: 4.2 mEq/L (ref 3.5–5.1)
Sodium: 137 mEq/L (ref 135–145)

## 2013-01-14 LAB — POCT ACTIVATED CLOTTING TIME: Activated Clotting Time: 150 seconds

## 2013-01-14 LAB — GLUCOSE, CAPILLARY
Glucose-Capillary: 113 mg/dL — ABNORMAL HIGH (ref 70–99)
Glucose-Capillary: 114 mg/dL — ABNORMAL HIGH (ref 70–99)
Glucose-Capillary: 128 mg/dL — ABNORMAL HIGH (ref 70–99)

## 2013-01-14 LAB — CK TOTAL AND CKMB (NOT AT ARMC)
CK, MB: 128.7 ng/mL (ref 0.3–4.0)
Relative Index: 1.4 (ref 0.0–2.5)
Total CK: 9032 U/L — ABNORMAL HIGH (ref 7–232)

## 2013-01-14 LAB — MAGNESIUM: Magnesium: 2.1 mg/dL (ref 1.5–2.5)

## 2013-01-14 LAB — PHOSPHORUS: Phosphorus: 2.9 mg/dL (ref 2.3–4.6)

## 2013-01-14 LAB — HEPARIN LEVEL (UNFRACTIONATED): Heparin Unfractionated: 0.21 IU/mL — ABNORMAL LOW (ref 0.30–0.70)

## 2013-01-14 MED ORDER — NICOTINE 21 MG/24HR TD PT24
21.0000 mg | MEDICATED_PATCH | Freq: Every day | TRANSDERMAL | Status: DC
  Administered 2013-01-15 – 2013-01-16 (×2): 21 mg via TRANSDERMAL
  Filled 2013-01-14 (×3): qty 1

## 2013-01-14 MED ORDER — DOPAMINE-DEXTROSE 3.2-5 MG/ML-% IV SOLN
INTRAVENOUS | Status: AC
  Filled 2013-01-14: qty 250

## 2013-01-14 MED ORDER — DOPAMINE-DEXTROSE 3.2-5 MG/ML-% IV SOLN: 2.0000 ug/kg/min | Freq: Once | INTRAVENOUS | Status: DC

## 2013-01-14 MED ORDER — ACETAMINOPHEN 325 MG PO TABS
650.0000 mg | ORAL_TABLET | Freq: Four times a day (QID) | ORAL | Status: DC | PRN
  Administered 2013-01-14: 650 mg via ORAL
  Filled 2013-01-14: qty 2

## 2013-01-14 MED ORDER — DOPAMINE-DEXTROSE 3.2-5 MG/ML-% IV SOLN: 2.0000 ug/kg/min | INTRAVENOUS | Status: DC

## 2013-01-14 MED FILL — Sodium Chloride IV Soln 0.9%: INTRAVENOUS | Qty: 50 | Status: AC

## 2013-01-14 NOTE — Progress Notes (Signed)
    Subjective:  Pt intubated but awake and alert. No chest pain.   Objective:  Vital Signs in the last 24 hours: Temp:  [97 F (36.1 C)-99.9 F (37.7 C)] 98.8 F (37.1 C) (10/21 0700) Pulse Rate:  [40-117] 75 (10/21 0803) Resp:  [14-25] 24 (10/21 0803) BP: (82-168)/(56-102) 118/73 mmHg (10/21 0803) SpO2:  [85 %-100 %] 98 % (10/21 0803) FiO2 (%):  [40 %-50 %] 40 % (10/21 0803) Weight:  [194 lb 7.1 oz (88.2 kg)] 194 lb 7.1 oz (88.2 kg) (10/21 0400)  Intake/Output from previous day: 10/20 0701 - 10/21 0700 In: 2704.8 [I.V.:2674.8; NG/GT:30] Out: 3675 [Urine:3675]  Physical Exam: Pt is alert, NAD, intubated HEENT: normal Neck: JVP - normal Lungs: CTA bilaterally CV: RRR without murmur or gallop Abd: soft, NT, Positive BS, no hepatomegaly Ext: no C/C/E, distal pulses intact and equal, right groin site clear Skin: warm/dry no rash   Lab Results:  Recent Labs  01/13/13 0815 01/14/13 0540  WBC 20.3* 17.3*  HGB 14.0 13.9  PLT 230 203    Recent Labs  01/13/13 0715 01/14/13 0540  NA 139 137  K 4.3 4.2  CL 104 103  CO2 24 24  GLUCOSE 97 177*  BUN 11 7  CREATININE 0.74 <0.20*    Recent Labs  01/13/13 0816  TROPONINI >20.00*    Cardiac Studies: 2D Echo: Study Conclusions  - Left ventricle: There is hypokinesis of the basal inferior nd inferolateral and mid inferior walls. The cavity size was normal. Wall thickness was normal. Systolic function was mildly reduced. The estimated ejection fraction was in the range of 45% to 50%. Left ventricular diastolic function parameters were normal. - Right ventricle: The cavity size was mildly dilated. Systolic function was mildly reduced. - Atrial septum: No defect or patent foramen ovale was identified.  Tele: Sinus rhythm with sinus brady  Assessment/Plan:  1. Inferior STEMI: s/p primary PCI. Continue ASA/brilinta/high-dose lipitor.  2. Cardiogenic shock, improving. LVEF 45%. Wean IABP and plan removal  today. Augmentation reduced to 1:3. If we can get dopamine down to 5 mcg will d/c IABP and hopefully extubate by this afternoon. 3. VDRF. Stable on vent. Awake and alert. Hopefully can extubate this pm if he remains hemodynamically stable.   Plan as above. No ACE or beta-blocker secondary to shock. Wean IABP and dopamine today.  The patient is critically ill with shock and VDRF and requires high complexity decision making for assessment and support, frequent evaluation and titration of therapies, application of advanced monitoring technologies and extensive interpretation of multiple databases.   Total CCT spent directly with the patient today is 35 minutes   Timothy Walker 01/14/2013 8:25 AM     Timothy Walker, M.D. 01/14/2013, 8:20 AM

## 2013-01-14 NOTE — Progress Notes (Signed)
ANTICOAGULATION CONSULT NOTE   Pharmacy Consult for Heparin Indication: IABP  No Known Allergies  Patient Measurements: Height: 5\' 10"  (177.8 cm) Weight: 194 lb 7.1 oz (88.2 kg) IBW/kg (Calculated) : 73  Vital Signs: Temp: 98.8 F (37.1 C) (10/21 0700) Temp src: Core (Comment) (10/21 0700) BP: 118/73 mmHg (10/21 0803) Pulse Rate: 75 (10/21 0803)  Labs:  Recent Labs  01/13/13 0715 01/13/13 0815 01/13/13 0816 01/13/13 1400 01/13/13 1500 01/14/13 0540  HGB  --  14.0  --   --   --  13.9  HCT  --  41.1  --   --   --  40.1  PLT  --  230  --   --   --  203  HEPARINUNFRC  --   --   --  0.25*  --  0.21*  CREATININE 0.74  --   --   --   --  <0.20*  CKTOTAL  --   --  8769*  --  12244* 9032*  CKMB  --   --  416.8*  --  353.4* 128.7*  TROPONINI  --   --  >20.00*  --   --   --     CrCl cannot be calculated (Patient has no sCr result on file.).   Medical History: Hypertension No past medical history on file.  Medications:  Ziac  Assessment: 47 yo male s/p STEMI, continues on IABP. Heparin gtt at goal for indication. No bleeding issues noted. CBC within normal limits. Patient awake and alert, continues on vent. Plan to wean of Dopamine gtt and pull IABP if tolerates.  Goal of Therapy:  Heparin level 0.2-0.5 Monitor platelets by anticoagulation protocol: Yes   Plan:  Continue heparin 1200 units/hr Check heparin level in am if continues  Sheppard Coil PharmD., BCPS Clinical Pharmacist Pager [phone] 01/14/2013 9:02 AM

## 2013-01-14 NOTE — Progress Notes (Signed)
01/14/2013- Resp Care Note- Pt suctioned and extubated at 0935 to 4lpm cannula.  Pt tolerated extubation well with pt vocalizing post extubation.  Ventilator on stand-by at bedside.  Will wean oxygen as tolerated and follow pt progress.

## 2013-01-14 NOTE — Progress Notes (Signed)
01/14/13- Resp Care Note- Pt jsuctioned and extubated at 0935

## 2013-01-14 NOTE — Progress Notes (Signed)
PULMONARY  / CRITICAL CARE MEDICINE  Name: Timothy Walker MRN: [id number] DOB: 11/13/1965    ADMISSION DATE:  01/13/2013 CONSULTATION DATE:  10-20  REFERRING MD :  Kirke Corin PRIMARY SERVICE: Cards  CHIEF COMPLAINT:  Substernal chest pain  BRIEF PATIENT DESCRIPTION:  47 yo smoker who presented to [facility] in [place] 10-20 with chest pain and 12 lead revealed a inferior MI. He was given asa and heparin and transported to [facility] ED. He promptly developed refractory VT, shocked x 15, intubated and taken to Cath Lab and RCA was stented. He had profound shock and required pressors and IABP. PCCM asked to manage vent. SIGNIFICANT EVENTS / STUDIES:  10-20 VT arrest RCA thrombus and  Stent placement  LINES / TUBES: 10-20 rt fem IABP>> ET tube 10/20>>>10/21  CULTURES: None  ANTIBIOTICS: None  SUBJECTIVE: Alert and interactive, moving all ext and following commands.  VITAL SIGNS: Temp:  [97.7 F (36.5 C)-99.9 F (37.7 C)] 99 F (37.2 C) (10/21 0900) Pulse Rate:  [40-156] 76 (10/21 0900) Resp:  [14-25] 17 (10/21 0900) BP: (82-168)/(53-116) 88/53 mmHg (10/21 0900) SpO2:  [85 %-100 %] 98 % (10/21 0900) FiO2 (%):  [40 %] 40 % (10/21 0803) Weight:  [88.2 kg (194 lb 7.1 oz)] 88.2 kg (194 lb 7.1 oz) (10/21 0400) HEMODYNAMICS:   VENTILATOR SETTINGS: Vent Mode:  [-] PRVC FiO2 (%):  [40 %] 40 % Set Rate:  [16 bmp] 16 bmp Vt Set:  [550 mL] 550 mL PEEP:  [5 cmH20] 5 cmH20 Plateau Pressure:  [14 cmH20-22 cmH20] 22 cmH20 INTAKE / OUTPUT: Intake/Output     10/20 0701 - 10/21 0700 10/21 0701 - 10/22 0700   I.V. (mL/kg) 2674.8 (30.3) 130.4 (1.5)   NG/GT 30    IV Piggyback     Total Intake(mL/kg) 2704.8 (30.7) 130.4 (1.5)   Urine (mL/kg/hr) 3675 (1.7) 225 (0.6)   Total Output 3675 225   Net -970.2 -94.6         PHYSICAL EXAMINATION: General:  WDWNWM sedated on vent Neuro: sedated on vent. No down time HEENT:  Ott-<vent                 OGT-> clamped Cardiovascular: HSR RR IABP  sound noted Lungs:  CTA Abdomen:  Faint bs, soft Musculoskeletal:  Rt fem iabp, rt foot cooler than left Skin:  cool  LABS:  CBC Recent Labs     01/13/13  0815  01/14/13  0540  WBC  20.3*  17.3*  HGB  14.0  13.9  HCT  41.1  40.1  PLT  230  203   Coag's No results found for this basename: APTT, INR,  in the last 72 hours BMET Recent Labs     01/13/13  0715  01/14/13  0540  NA  139  137  K  4.3  4.2  CL  104  103  CO2  24  24  BUN  11  7  CREATININE  0.74  <0.20*  GLUCOSE  97  177*   Electrolytes Recent Labs     01/13/13  0715  01/14/13  0540  CALCIUM  7.9*  8.0*  MG   --   2.1  PHOS   --   2.9   Sepsis Markers No results found for this basename: LACTICACIDVEN, PROCALCITON, O2SATVEN,  in the last 72 hours ABG Recent Labs     01/13/13  0622  01/13/13  1045  01/14/13  0512  PHART  7.279*  7.335*  7.358  PCO2ART  46.6*  43.5  42.9  PO2ART  171.0*  131.0*  98.2   Liver Enzymes No results found for this basename: AST, ALT, ALKPHOS, BILITOT, ALBUMIN,  in the last 72 hours Cardiac Enzymes Recent Labs     01/13/13  0816  TROPONINI  >20.00*   Glucose Recent Labs     01/13/13  1659  01/13/13  1930  01/13/13  2357  01/14/13  0500  GLUCAP  100*  109*  114*  128*   Imaging Dg Chest Port 1 View  01/14/2013   CLINICAL DATA:  Assess ET tube.  EXAM: PORTABLE CHEST - 1 VIEW  COMPARISON:  01/13/2013  FINDINGS: Support devices including endotracheal tube and intra-aortic balloon pump remain in place, unchanged. The intra-aortic balloon pump tip is within the mid descending thoracic aorta. Mild cardiomegaly. No confluent airspace opacities or effusions.  IMPRESSION: Stable support devices. Stable exam.   Electronically Signed   By: Charlett Nose M.D.   On: 01/14/2013 07:38   Portable Chest Xray  01/13/2013   *RADIOLOGY REPORT*  Clinical Data: Cardiac arrest, new intubation.  PORTABLE CHEST - 1 VIEW  Comparison: Chest radiograph January 13, 2013 at 03:15 a.m.   Findings: Cardiac silhouette appears mildly enlarged.  Mild bibasilar air space opacities with air bronchograms in the left retrocardiac space.  Small left pleural effusion.  Endotracheal tube tip projects 2.7 cm above the carina. Nasogastric tube looped in the stomach, distal tip projecting in the cardia.  A linear 12 mm radiopaque foreign body projects in the descending aorta.  Soft tissue planes and included osseous structures are not suspicious.  IMPRESSION: Borderline cardiomegaly, with increasing mild bibasilar air space opacities, small retrocardiac air bronchogram which may reflect pneumonia/aspiration.  Small left pleural effusion.  Endotracheal tube tip projects 2.7 cm above the carina, similar. Nasogastric tube tip projects in the gastric cardia.  Linear radiopaque foreign bodies projecting in the descending aorta suggest aortic balloon pump projecting 5 cm below the aortic knob.   Original Report Authenticated By: Awilda Metro   Dg Chest Portable 1 View  01/13/2013   *RADIOLOGY REPORT*  Clinical Data: Post CPR  PORTABLE CHEST - 1 VIEW  Comparison: Chest radiograph January 13, 2013 at 0213 hours  Findings: Interval intubation, distal tip projects 2.5 cm above the carina.  Nasogastric tube in place, distal tip not imaged at least past the distal esophagus.  Borderline cardiomegaly.  Increasing mild central pulmonary vascular congestion and mild interstitial prominence without pleural effusions or focal consolidations.  No pneumothorax.  Multiple EKG lines overlay the patient and could obscure underlying subtle pathology.  Soft tissue planes and included osseous structures are not suspicious.  IMPRESSION: Endotracheal tube tip projects 2.5 cm above the carina; nasogastric tube past the distal esophagus though, tip not imaged.  Borderline cardiomegaly with increasing mild to moderate pulmonary edema.   Original Report Authenticated By: Awilda Metro   CXR: see above  Active Problems:    Paroxysmal ventricular tachycardia   Acute and chronic respiratory failure   Tobacco abuse   Heart disease, unspecified   Cardiogenic shock   ASSESSMENT / PLAN:  PULMONARY A: VDRF due to agitation and shock Tobacco abuse P:   - Wean to extubate. - BD's as ordered. - Smoking cessation post extubation.  CARDIOVASCULAR A:  Post VT arrest with CC and stent of RCA Cardiogenic shock P:  - Pressors per cards (no cvl), hopefully decrease  - IABP per cards, remove when  cardiology is ready. - CVL only if unable to get off pressors. - Heparin per cards.  RENAL Lab Results  Component Value Date   CREATININE <0.20* 01/14/2013   CREATININE 0.74 01/13/2013   A:  No acute issue  P:   - IVF as ordered. - BMET in AM. - Replace electrolytes as needed.  GASTROINTESTINAL A: GI protection P:   - PPI. - Extubate then begin diet (no need for swallow evaluation).  HEMATOLOGIC A:  No acute issue  P:  - CBC in AM.  INFECTIOUS A:  No acute issue  P:   - Monitor fever curve and WBC, no abx for now.  ENDOCRINE A:  No acute issue  P:   - Monitor CBGs. - Cortisol level low, continue stress dose steroids for now. - Will consider d/c of stress dose steroids after patient is off pressors and IABP.  NEUROLOGIC A:  Sedated on vent, agitated prior to CC. P:   - D/C sedation.  TODAY'S SUMMARY:  47 yo smoker who presented to [facility] in [place] 10-20 with chest pain and 12 lead revealed a inferior MI. Extubate and titrate O2, in bed until IABP is out.  CC time 35 minutes.  Alyson Reedy, M.D. [facility] Pulmonary/Critical Care Medicine. Pager: [phone]. After hours pager: [phone].

## 2013-01-15 DIAGNOSIS — I2119 ST elevation (STEMI) myocardial infarction involving other coronary artery of inferior wall: Secondary | ICD-10-CM

## 2013-01-15 LAB — CBC
HCT: 35.2 % — ABNORMAL LOW (ref 39.0–52.0)
Hemoglobin: 12 g/dL — ABNORMAL LOW (ref 13.0–17.0)
MCH: 34.2 pg — ABNORMAL HIGH (ref 26.0–34.0)
MCHC: 34.1 g/dL (ref 30.0–36.0)
MCV: 100.3 fL — ABNORMAL HIGH (ref 78.0–100.0)
Platelets: 148 10*3/uL — ABNORMAL LOW (ref 150–400)
RBC: 3.51 MIL/uL — ABNORMAL LOW (ref 4.22–5.81)
RDW: 13.7 % (ref 11.5–15.5)
WBC: 12.3 10*3/uL — ABNORMAL HIGH (ref 4.0–10.5)

## 2013-01-15 LAB — MAGNESIUM: Magnesium: 2 mg/dL (ref 1.5–2.5)

## 2013-01-15 LAB — BASIC METABOLIC PANEL
BUN: 9 mg/dL (ref 6–23)
CO2: 23 mEq/L (ref 19–32)
Calcium: 8.8 mg/dL (ref 8.4–10.5)
Chloride: 106 mEq/L (ref 96–112)
Creatinine, Ser: 0.66 mg/dL (ref 0.50–1.35)
GFR calc Af Amer: >90 mL/min (ref >90)
GFR calc non Af Amer: >90 mL/min (ref >90)
Glucose, Bld: 99 mg/dL (ref 70–99)
Potassium: 3.6 mEq/L (ref 3.5–5.1)
Sodium: 140 mEq/L (ref 135–145)

## 2013-01-15 LAB — PHOSPHORUS: Phosphorus: 2.4 mg/dL (ref 2.3–4.6)

## 2013-01-15 MED ORDER — METOPROLOL TARTRATE 25 MG PO TABS
25.0000 mg | ORAL_TABLET | Freq: Two times a day (BID) | ORAL | Status: DC
  Administered 2013-01-15 – 2013-01-16 (×4): 25 mg via ORAL
  Filled 2013-01-15 (×6): qty 1

## 2013-01-15 MED ORDER — ALBUTEROL SULFATE (5 MG/ML) 0.5% IN NEBU
2.5000 mg | INHALATION_SOLUTION | Freq: Four times a day (QID) | RESPIRATORY_TRACT | Status: DC | PRN
  Administered 2013-01-15 – 2013-01-16 (×3): 2.5 mg via RESPIRATORY_TRACT
  Filled 2013-01-15 (×3): qty 0.5

## 2013-01-15 MED ORDER — PANTOPRAZOLE SODIUM 40 MG PO TBEC
40.0000 mg | DELAYED_RELEASE_TABLET | Freq: Every day | ORAL | Status: DC
  Administered 2013-01-15 – 2013-01-16 (×2): 40 mg via ORAL
  Filled 2013-01-15 (×2): qty 1

## 2013-01-15 MED ORDER — INFLUENZA VAC SPLIT QUAD 0.5 ML IM SUSP
0.5000 mL | INTRAMUSCULAR | Status: AC
  Administered 2013-01-16: 0.5 mL via INTRAMUSCULAR
  Filled 2013-01-15: qty 0.5

## 2013-01-15 MED ORDER — IPRATROPIUM BROMIDE 0.02 % IN SOLN
0.5000 mg | Freq: Four times a day (QID) | RESPIRATORY_TRACT | Status: DC | PRN
  Administered 2013-01-15 – 2013-01-16 (×2): 0.5 mg via RESPIRATORY_TRACT
  Filled 2013-01-15 (×2): qty 2.5

## 2013-01-15 NOTE — Progress Notes (Signed)
    Subjective:  C/o chest soreness and cough. No SSCP, palps, or other complaints.  Objective:  Vital Signs in the last 24 hours: Temp:  [98.8 F (37.1 C)-99.4 F (37.4 C)] 99.4 F (37.4 C) (10/22 0443) Pulse Rate:  [53-156] 70 (10/22 0500) Resp:  [14-26] 21 (10/21 1800) BP: (88-151)/(53-116) 128/85 mmHg (10/22 0500) SpO2:  [95 %-100 %] 96 % (10/22 0535) FiO2 (%):  [40 %] 40 % (10/21 0803)  Intake/Output from previous day: 10/21 0701 - 10/22 0700 In: 332.1 [P.O.:100; I.V.:232.1] Out: 1350 [Urine:1350]  Physical Exam: Pt is alert and oriented, NAD HEENT: normal Neck: JVP - normal Lungs: CTA bilaterally CV: RRR without murmur or gallop Abd: soft, NT, Positive BS, no hepatomegaly Ext: no C/C/E, distal pulses intact and equal, left groin with mild ecchymoses no hematoma, right groin site clear Skin: warm/dry no rash   Lab Results:  Recent Labs  01/14/13 0540 01/15/13 0454  WBC 17.3* 12.3*  HGB 13.9 12.0*  PLT 203 148*    Recent Labs  01/13/13 0715 01/14/13 0540  NA 139 137  K 4.3 4.2  CL 104 103  CO2 24 24  GLUCOSE 97 177*  BUN 11 7  CREATININE 0.74 <0.20*    Recent Labs  01/13/13 0816  TROPONINI >20.00*   Tele: Sinus rhythm, personally reviewed.  Assessment/Plan:  1. Inferior STEMI. Continue current med Rx and BP now stable to add low-dose beta-blocker. Making good progress. Tx tele today. Anticipate discharge about 48 hours. Suspect chest soreness related to multiple shocks and CPR.  2. Cardiogenic shock, now resolved. Off IABP and pressors.  3. Tobacco - cessation advised. On nicotine patch.  Tonny Bollman, M.D. 01/15/2013, 6:25 AM

## 2013-01-15 NOTE — Progress Notes (Signed)
PULMONARY  / CRITICAL CARE MEDICINE  Name: Timothy Walker MRN: [id number] DOB: [birth year]    ADMISSION DATE:  01/13/2013 CONSULTATION DATE:  10-20  REFERRING MD :  Kirke Corin PRIMARY SERVICE: Cards  CHIEF COMPLAINT:  Substernal chest pain  BRIEF PATIENT DESCRIPTION:  47 yo smoker who presented to [facility] in [place] 10-20 with chest pain and 12 lead revealed a inferior MI. He was given asa and heparin and transported to [facility] ED. He promptly developed refractory VT, shocked x 15, intubated and taken to Cath Lab and RCA was stented. He had profound shock and required pressors and IABP. PCCM asked to manage vent. SIGNIFICANT EVENTS / STUDIES:  10-20 VT arrest RCA thrombus and  Stent placement  LINES / TUBES: 10-20 rt fem IABP>> ET tube 10/20>>>10/21  CULTURES: None  ANTIBIOTICS: None  SUBJECTIVE: Alert, following commands and interactive, ambulating in halls VITAL SIGNS: Temp:  [98 F (36.7 C)-99.4 F (37.4 C)] 98 F (36.7 C) (10/22 0735) Pulse Rate:  [65-92] 87 (10/22 0900) Resp:  [15-26] 18 (10/22 0800) BP: (99-151)/(55-96) 108/58 mmHg (10/22 0900) SpO2:  [94 %-100 %] 97 % (10/22 0900) HEMODYNAMICS:   VENTILATOR SETTINGS:   INTAKE / OUTPUT: Intake/Output     10/21 0701 - 10/22 0700 10/22 0701 - 10/23 0700   P.O. 100 240   I.V. (mL/kg) 232.1 (2.6)    NG/GT     Total Intake(mL/kg) 332.1 (3.8) 240 (2.7)   Urine (mL/kg/hr) 1350 (0.6)    Total Output 1350     Net -1017.9 +240        Urine Occurrence 3 x     PHYSICAL EXAMINATION: General:  Alert and oriented, conversational, preparing to transfer to tele bed. Neuro: Alert and oriented x4, MAE, following commands HEENT:normocephalic,atraumatic, -JVD, - lymphadenopathy                Cardiovascular: NSR, S1, S2, RRR -Murmur/rub/gallop Lungs:  CTA, diminished per bases Abdomen:  Faint bs, soft. Musculoskeletal: no edema noted, good pulses right foot.   Skin:  Cool.  LABS:  CBC Recent Labs     01/13/13   0815  01/14/13  0540  01/15/13  0454  WBC  20.3*  17.3*  12.3*  HGB  14.0  13.9  12.0*  HCT  41.1  40.1  35.2*  PLT  230  203  148*   Coag's No results found for this basename: APTT, INR,  in the last 72 hours BMET Recent Labs     01/13/13  0715  01/14/13  0540  01/15/13  0454  NA  139  137  140  K  4.3  4.2  3.6  CL  104  103  106  CO2  24  24  23   BUN  11  7  9   CREATININE  0.74  <0.20*  0.66  GLUCOSE  97  177*  99   Electrolytes Recent Labs     01/13/13  0715  01/14/13  0540  01/15/13  0454  CALCIUM  7.9*  8.0*  8.8  MG   --   2.1  2.0  PHOS   --   2.9  2.4   Sepsis Markers No results found for this basename: LACTICACIDVEN, PROCALCITON, O2SATVEN,  in the last 72 hours ABG Recent Labs     01/13/13  0622  01/13/13  1045  01/14/13  0512  PHART  7.279*  7.335*  7.358  PCO2ART  46.6*  43.5  42.9  PO2ART  171.0*  131.0*  98.2   Liver Enzymes No results found for this basename: AST, ALT, ALKPHOS, BILITOT, ALBUMIN,  in the last 72 hours Cardiac Enzymes Recent Labs     01/13/13  0816  TROPONINI  >20.00*   Glucose Recent Labs     01/13/13  1659  01/13/13  1930  01/13/13  2357  01/14/13  0500  01/14/13  1019  GLUCAP  100*  109*  114*  128*  113*   Imaging Dg Chest Port 1 View  01/14/2013   CLINICAL DATA:  Assess ET tube.  EXAM: PORTABLE CHEST - 1 VIEW  COMPARISON:  01/13/2013  FINDINGS: Support devices including endotracheal tube and intra-aortic balloon pump remain in place, unchanged. The intra-aortic balloon pump tip is within the mid descending thoracic aorta. Mild cardiomegaly. No confluent airspace opacities or effusions.  IMPRESSION: Stable support devices. Stable exam.   Electronically Signed   By: Charlett Nose M.D.   On: 01/14/2013 07:38   CXR: see above  Active Problems:   Paroxysmal ventricular tachycardia   Acute and chronic respiratory failure   Tobacco abuse   Heart disease, unspecified   Cardiogenic shock   ASSESSMENT /  PLAN:  PULMONARY A: VDRF due to agitation and shock: resoved Tobacco abuse P:   - Continue pulmonary toilet. - BD's as ordered. - Smoking cessation post extubation. - Nicotine patch. - IS per RT protocol.  CARDIOVASCULAR A:  Post VT arrest with CC and stent of RCA Cardiogenic shock: Resolved P:  - Transfer to tele bed per cards. - Adding low dose beta blocker per cards. - Monitor.  RENAL Lab Results  Component Value Date   CREATININE 0.66 01/15/2013   CREATININE <0.20* 01/14/2013   CREATININE 0.74 01/13/2013   A:  No acute issue  P:   - IVF as ordered. - Trend BMET's. - Replace electrolytes as needed.  GASTROINTESTINAL A: GI protection P:   - PPI. - Tolerating heart Diet well  HEMATOLOGIC A:  No acute issue  P:  - Trend CBC.  INFECTIOUS A:  No acute issue  P:   - Monitor fever curve and WBC, no abx for now.  ENDOCRINE A:  No acute issue  P:   - Monitor CBGs. - Cortisol level low, continue stress dose steroids for now. - No longer requiring pressors, Stress dose steroids D/C'd 10/22  NEUROLOGIC A:  Sedated on vent, agitated prior to CC. P:   - D/C sedation.  TODAY'S SUMMARY:  47 yo smoker who presented to [facility] in [place] 10-20 with chest pain and 12 lead revealed a inferior MI. Extubated 10/22, off pressors, IABP d/c'd, for transfer to tele bed.   Scribed by Kandice Robinsons, RN ACNP Student, [employer] for Canary Brim NP-C.  Transfer to tele, no further pulmonary concerns, recommend smoking cessation, IS as ordered, AM labs ordered and electrolytes replaced.  PCCM will sign off, please call back if needed.  Patient seen and examined, agree with above note.  I dictated the care and orders written for this patient under my direction.  Alyson Reedy, MD [phone]

## 2013-01-15 NOTE — Progress Notes (Signed)
CARDIAC REHAB PHASE I   PRE:  Rate/Rhythm: 90SR  BP:  Supine:   Sitting: 150/98  Standing:    SaO2: 99%RA  MODE:  Ambulation: 550 ft   POST:  Rate/Rhythm: 93SR  BP:  Supine:   Sitting: 150/98  Standing:    SaO2: 100%RA 1320-1445 Pt walked 550 ft with steady gait. Tolerated well. No CP. Education started with pt and son. Understanding voiced. Needs ex and CRP 2 tomorrow. Pt very inquisitive and listened intently. Pt ready to make changes. Discussed that he needs to have plan for quitting. He may use patches. Gave smoking cessation handouts and encouraged him to call 1800quitnow as sometimes that have funding for patches. Discussed dietary changes and gave diet handout.    Luetta Nutting, RN BSN  01/15/2013 2:38 PM   ()

## 2013-01-16 ENCOUNTER — Encounter: Payer: Self-pay | Admitting: Cardiovascular Disease

## 2013-01-16 ENCOUNTER — Encounter (HOSPITAL_COMMUNITY): Payer: Self-pay | Admitting: General Practice

## 2013-01-16 DIAGNOSIS — I2119 ST elevation (STEMI) myocardial infarction involving other coronary artery of inferior wall: Secondary | ICD-10-CM

## 2013-01-16 HISTORY — DX: ST elevation (STEMI) myocardial infarction involving other coronary artery of inferior wall: I21.19

## 2013-01-16 LAB — BASIC METABOLIC PANEL
BUN: 10 mg/dL (ref 6–23)
CO2: 23 mEq/L (ref 19–32)
Calcium: 8.9 mg/dL (ref 8.4–10.5)
Chloride: 106 mEq/L (ref 96–112)
Creatinine, Ser: 0.76 mg/dL (ref 0.50–1.35)
GFR calc Af Amer: >90 mL/min (ref >90)
GFR calc non Af Amer: >90 mL/min (ref >90)
Glucose, Bld: 96 mg/dL (ref 70–99)
Potassium: 3.9 mEq/L (ref 3.5–5.1)
Sodium: 139 mEq/L (ref 135–145)

## 2013-01-16 LAB — CBC
HCT: 36.9 % — ABNORMAL LOW (ref 39.0–52.0)
Hemoglobin: 12.8 g/dL — ABNORMAL LOW (ref 13.0–17.0)
MCH: 34.7 pg — ABNORMAL HIGH (ref 26.0–34.0)
MCHC: 34.7 g/dL (ref 30.0–36.0)
MCV: 100 fL (ref 78.0–100.0)
Platelets: 173 10*3/uL (ref 150–400)
RBC: 3.69 MIL/uL — ABNORMAL LOW (ref 4.22–5.81)
RDW: 13.5 % (ref 11.5–15.5)
WBC: 12.7 10*3/uL — ABNORMAL HIGH (ref 4.0–10.5)

## 2013-01-16 MED ORDER — IBUPROFEN 200 MG PO TABS: 400.0000 mg | ORAL_TABLET | Freq: Every day | ORAL | Status: DC | PRN

## 2013-01-16 MED ORDER — PANTOPRAZOLE SODIUM 40 MG PO TBEC
40.0000 mg | DELAYED_RELEASE_TABLET | Freq: Two times a day (BID) | ORAL | Status: DC
  Administered 2013-01-16 – 2013-01-17 (×2): 40 mg via ORAL
  Filled 2013-01-16 (×3): qty 1

## 2013-01-16 MED ORDER — TICAGRELOR 90 MG PO TABS: 90.0000 mg | ORAL_TABLET | Freq: Two times a day (BID) | ORAL | Status: DC

## 2013-01-16 MED ORDER — ATORVASTATIN CALCIUM 80 MG PO TABS: 80.0000 mg | ORAL_TABLET | Freq: Every day | ORAL | Status: DC

## 2013-01-16 MED ORDER — NITROGLYCERIN 0.4 MG SL SUBL: 0.4000 mg | SUBLINGUAL_TABLET | SUBLINGUAL | Status: DC | PRN

## 2013-01-16 MED ORDER — NICOTINE 21 MG/24HR TD PT24: 1.0000 | MEDICATED_PATCH | Freq: Every day | TRANSDERMAL | Status: DC

## 2013-01-16 MED ORDER — PANTOPRAZOLE SODIUM 40 MG PO TBEC: 40.0000 mg | DELAYED_RELEASE_TABLET | Freq: Two times a day (BID) | ORAL | Status: DC

## 2013-01-16 MED ORDER — LISINOPRIL 10 MG PO TABS
10.0000 mg | ORAL_TABLET | Freq: Every day | ORAL | Status: DC
  Administered 2013-01-16 – 2013-01-17 (×2): 10 mg via ORAL
  Filled 2013-01-16 (×2): qty 1

## 2013-01-16 MED ORDER — ASPIRIN 81 MG PO CHEW: 81.0000 mg | CHEWABLE_TABLET | Freq: Every day | ORAL | Status: DC

## 2013-01-16 MED ORDER — METOPROLOL TARTRATE 25 MG PO TABS: 25.0000 mg | ORAL_TABLET | Freq: Two times a day (BID) | ORAL | Status: DC

## 2013-01-16 NOTE — Progress Notes (Signed)
Patient Name: Timothy Walker Date of Encounter: 01/16/2013  Principal Problem:   ST elevation myocardial infarction (STEMI) of inferior wall, initial episode of care Active Problems:   Ventricular tachycardia, sustained   Acute and chronic respiratory failure   Tobacco abuse   Heart disease, unspecified   Cardiogenic shock    SUBJECTIVE: No chest pain, no SOB. Heart skips at times, has for years. He has questions. 1. When to go home? 2. Can he have hemorrhoid surgery soon on Brilinta? (I said no) 3. Has HH and has had esoph stricture/stretching in the past. Trouble swallowing now, acute on chronic. Can he have ECG/stretch on Brilinta? (I will increase PPI to BID) 4. Smokes very heavily. Can he have 1 cigarette to say goodbye? He also smokes marijuana, 1-2/day, can he continue this? It helps with stress, numerous sources.  OBJECTIVE Filed Vitals:   01/15/13 0900 01/15/13 1144 01/15/13 2100 01/16/13 0637  BP: 108/58 134/83 155/98 141/98  Pulse: 87 81    Temp:   98.3 F (36.8 C) 98.2 F (36.8 C)  TempSrc:   Oral Oral  Resp:   18 18  Height:      Weight:      SpO2: 97%  100% 98%   No intake or output data in the 24 hours ending 01/16/13 1004 Filed Weights   01/13/13 0322 01/14/13 0400  Weight: 195 lb (88.451 kg) 194 lb 7.1 oz (88.2 kg)    PHYSICAL EXAM General: Well developed, well nourished, male in no acute distress. Head: Normocephalic, atraumatic.  Neck: Supple without bruits, JVD not elevated. Lungs:  Resp regular and unlabored, rales bases. Heart: RRR, S1, S2, no S3, S4, or murmur; no rub. Abdomen: Soft, non-tender, non-distended, BS + x 4.  Extremities: No clubbing, cyanosis, no edema.  Neuro: Alert and oriented X 3. Moves all extremities spontaneously. Psych: Appears chronically anxious.  LABS: CBC:  Recent Labs  01/15/13 0454 01/16/13 0438  WBC 12.3* 12.7*  HGB 12.0* 12.8*  HCT 35.2* 36.9*  MCV 100.3* 100.0  PLT 148* 173   Basic Metabolic  Panel:  Recent Labs  01/14/13 0540 01/15/13 0454 01/16/13 0438  NA 137 140 139  K 4.2 3.6 3.9  CL 103 106 106  CO2 24 23 23   GLUCOSE 177* 99 96  BUN 7 9 10   CREATININE <0.20* 0.66 0.76  CALCIUM 8.0* 8.8 8.9  MG 2.1 2.0  --   PHOS 2.9 2.4  --    Cardiac Enzymes:  Recent Labs  01/13/13 1500 01/14/13 0540  CKTOTAL 16109* 9032*  CKMB 353.4* 128.7*    TROPONINI >20.00* 01/13/2013    Fasting Lipid Panel:  Lab Results  Component Value Date   CHOL 148 01/13/2013   HDL 31* 01/13/2013   LDLCALC 81 01/13/2013   TRIG 178* 01/13/2013   CHOLHDL 4.8 01/13/2013   TELE:   SR, occ PVCs  Radiology/Studies: Dg Chest Port 1 View 01/14/2013   CLINICAL DATA:  Assess ET tube.  EXAM: PORTABLE CHEST - 1 VIEW  COMPARISON:  01/13/2013  FINDINGS: Support devices including endotracheal tube and intra-aortic balloon pump remain in place, unchanged. The intra-aortic balloon pump tip is within the mid descending thoracic aorta. Mild cardiomegaly. No confluent airspace opacities or effusions.  IMPRESSION: Stable support devices. Stable exam.   Electronically Signed   By: Charlett Nose M.D.   On: 01/14/2013 07:38   Portable Chest Xray 01/13/2013   *RADIOLOGY REPORT*  Clinical Data: Cardiac arrest, new intubation.  PORTABLE CHEST - 1 VIEW  Comparison: Chest radiograph January 13, 2013 at 03:15 a.m.  Findings: Cardiac silhouette appears mildly enlarged.  Mild bibasilar air space opacities with air bronchograms in the left retrocardiac space.  Small left pleural effusion.  Endotracheal tube tip projects 2.7 cm above the carina. Nasogastric tube looped in the stomach, distal tip projecting in the cardia.  A linear 12 mm radiopaque foreign body projects in the descending aorta.  Soft tissue planes and included osseous structures are not suspicious.  IMPRESSION: Borderline cardiomegaly, with increasing mild bibasilar air space opacities, small retrocardiac air bronchogram which may reflect pneumonia/aspiration.   Small left pleural effusion.  Endotracheal tube tip projects 2.7 cm above the carina, similar. Nasogastric tube tip projects in the gastric cardia.  Linear radiopaque foreign bodies projecting in the descending aorta suggest aortic balloon pump projecting 5 cm below the aortic knob.   Original Report Authenticated By: Awilda Metro   Dg Chest Portable 1 View 01/13/2013   *RADIOLOGY REPORT*  Clinical Data: Post CPR  PORTABLE CHEST - 1 VIEW  Comparison: Chest radiograph January 13, 2013 at 0213 hours  Findings: Interval intubation, distal tip projects 2.5 cm above the carina.  Nasogastric tube in place, distal tip not imaged at least past the distal esophagus.  Borderline cardiomegaly.  Increasing mild central pulmonary vascular congestion and mild interstitial prominence without pleural effusions or focal consolidations.  No pneumothorax.  Multiple EKG lines overlay the patient and could obscure underlying subtle pathology.  Soft tissue planes and included osseous structures are not suspicious.  IMPRESSION: Endotracheal tube tip projects 2.5 cm above the carina; nasogastric tube past the distal esophagus though, tip not imaged.  Borderline cardiomegaly with increasing mild to moderate pulmonary edema.   Original Report Authenticated By: Awilda Metro     Current Medications:  . aspirin  81 mg Oral Daily  . atorvastatin  80 mg Oral q1800  . influenza vac split quadrivalent PF  0.5 mL Intramuscular Tomorrow-1000  . metoprolol tartrate  25 mg Oral BID  . nicotine  21 mg Transdermal Daily  . pantoprazole  40 mg Oral Daily  . Ticagrelor  90 mg Oral BID      ASSESSMENT AND PLAN: Principal Problem:   ST elevation myocardial infarction (STEMI) of inferior wall, initial episode of care - s/p DES RCA with 80-90% CFX/OM treated medcially. EF 55%. ASA/Brilinta/BB/statin  Active Problems:   Ventricular tachycardia, sustained - In the setting of an inferior MI, now on BB, occ PVCs on telemetry.    Acute and chronic respiratory failure - resolved   Tobacco abuse - has patch, MD advise on other Rx.   Heart disease, unspecified - CAD   Cardiogenic shock - In the setting of MI, resolved    Hx esoph stricture - MD advise on timing of EGD.  Plan - continue to increase activity, d/c when medically stable.    Signed, Theodore Demark , PA-C 10:04 AM 01/16/2013  Patient seen, examined. Available data reviewed. Agree with findings, assessment, and plan as outlined by Theodore Demark, PA-C. Pt is making good progress. We had a lengthy discussion about post-MI restrictions, medications, and tobacco cessation. Will add lisinopril today as BP is elevated. 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 OK to drive after his follow-up office visit. Can follow-up in [place]. Will see in am and plan on discharge at that time.  Tonny Bollman, M.D. 01/16/2013 12:59 PM

## 2013-01-16 NOTE — Progress Notes (Signed)
[id number] Discussed exercise guidelines and perceived exertion with pt stressing not to over do. Pt stated he has walked 30 minutes 4 times already today. Stated he timed the walks. Told pt this is too much to do this soon. Encouraged him to rest 2-3 hours between walks and cut down to 8 minutes today. Pt seems very nervous with legs shaking during ed. Stated he felt great and this is why he walked so much but discussed that his heart has had a big hit and he needs to recover. Declined CRP 2 due to work schedule. To exercise on his own adhering to written instructions. Duanne Limerick, RN BSN 10:10 AM 01/16/2013

## 2013-01-17 ENCOUNTER — Encounter (HOSPITAL_COMMUNITY): Payer: Self-pay | Admitting: Nurse Practitioner

## 2013-01-17 DIAGNOSIS — I2510 Atherosclerotic heart disease of native coronary artery without angina pectoris: Secondary | ICD-10-CM

## 2013-01-17 DIAGNOSIS — I959 Hypotension, unspecified: Secondary | ICD-10-CM

## 2013-01-17 DIAGNOSIS — I4901 Ventricular fibrillation: Secondary | ICD-10-CM

## 2013-01-17 DIAGNOSIS — I25110 Atherosclerotic heart disease of native coronary artery with unstable angina pectoris: Secondary | ICD-10-CM

## 2013-01-17 HISTORY — DX: Ventricular fibrillation: I49.01

## 2013-01-17 MED ORDER — METOPROLOL TARTRATE 50 MG PO TABS: 50.0000 mg | ORAL_TABLET | Freq: Two times a day (BID) | ORAL | Status: DC

## 2013-01-17 MED ORDER — LISINOPRIL 10 MG PO TABS: 10.0000 mg | ORAL_TABLET | Freq: Every day | ORAL | Status: DC

## 2013-01-17 MED ORDER — METOPROLOL TARTRATE 50 MG PO TABS
50.0000 mg | ORAL_TABLET | Freq: Two times a day (BID) | ORAL | Status: DC
  Administered 2013-01-17: 50 mg via ORAL
  Filled 2013-01-17 (×2): qty 1

## 2013-01-17 NOTE — Discharge Summary (Signed)
Discharge Summary   Patient ID: Timothy Walker,  MRN: [id number], DOB/AGE: [birth year] 47 y.o.  Admit date: 01/13/2013 Discharge date: 01/17/2013  Primary Care Provider: HAWKINS,EDWARD L Primary Cardiologist: Dominga Ferry, MD   Discharge Diagnoses Principal Problem:   ST elevation myocardial infarction (STEMI) of inferior wall, initial episode of care  **s/p PCI and DES of the RCA this admission with placement of a 3.5 x 18 mm Xience DES.  Active Problems:   Ventricular tachycardia, sustained/Ventricular fibrillation  **requiring defibrillation.   Acute and chronic respiratory failure/VDRF   Cardiogenic shock/Hypotension  **requiring vasopressors and intra-aortic balloon pump.     CAD (coronary artery disease)   Tobacco abuse   Allergies No Known Allergies  Procedures  Cardiac Catheterization and Percutaneous Coronary Intervention 10.20.2014  Hemodynamics: AO:   119/82   mmHg LV:   119/15     mmHg LVEDP: 28   mmHg  Coronary angiography: Coronary dominance: right      Left Main:   normal    Left Anterior Descending (LAD):   normal in size with minor irregularities throughout its course but no evidence of obstructive disease.    1st diagonal (D1):   small in size with 50% ostial stenosis.    2nd diagonal (D2):   small in size with minor irregularities.    3rd diagonal (D3):   very small in size.    Circumflex (LCx):   normal in size and nondominant. There is an 80-90% mid stenosis at the origin of OM 2.    1st obtuse marginal:   small in size with minor irregularities.    2nd obtuse marginal:   normal in size with no significant disease.    3rd obtuse marginal:   medium in size with no significant disease.           Ramus Intermedius:  normal in size with 20% proximal stenosis.    Right Coronary Artery:  large in size and dominant. There is 50% proximal stenosis. The vessel is occluded distally with large thrombus burden.    **The RCA was successfully stented  using a 3.5 x 18 mm Xience Expedition drug-eluting stent.**   Posterior descending artery:  normal  Posterior AV segment:  normal    Posterolateral branchs:   normal  Left ventriculography: Left ventricular systolic function is  normal , LVEF is estimated at  55  %, there is  no  significant mitral regurgitation . mild inferior wall hypokinesis  _____________   2D Echocardiogram 10.20.2014  Study Conclusions  - Left ventricle: There is hypokinesis of the basal inferior   nd inferolateral and mid inferior walls. The cavity size   was normal. Wall thickness was normal. Systolic function   was mildly reduced. The estimated ejection fraction was in   the range of 45% to 50%. Left ventricular diastolic   function parameters were normal. - Right ventricle: The cavity size was mildly dilated.   Systolic function was mildly reduced. - Atrial septum: No defect or patent foramen ovale was   identified. _____________   History of Present Illness  47 year old male without prior cardiac history. He was in his usual state of health until 01/13/2013 when he developed sudden onset of substernal chest tightness and presented to the [facility] emergency department. There, he was found to have inferior ST segment elevation. He was treated with aspirin and heparin and a code STEMI was activated.  He was transferred to [facility] for further management.  Hospital Course  Upon  arrival to [facility], patient experienced ventricular fibrillation requiring a minimum of 15 defibrillations and initiation of IV amiodarone. He also experienced respiratory failure requiring emergent intubation. He became hypotensive and bradycardic and dopamine therapy was initiated. He was then taken emergently to the cardiac catheterization laboratory where diagnostic catheterization revealed occlusion of the distal right coronary artery. He also had severe bifurcation disease in the left circumflex, which was felt to  supply a small to medium-sized territory and was left to be medically managed. The right coronary artery was felt to be the culprit vessel and this was successfully stented with a 3.5 x 18 mm Xience expedition drug-eluting stent. Secondary to ongoing hypotension an intra-aortic balloon pump was placed and vasopressor therapy was continued. Patient also had intermittent bradycardia requiring a temporary transvenous pacemaker, which was removed with end of the case. Post procedure, patient was transported to the coronary intensive care unit per critical care medicine was consulted for further ventilator management.  Patient ruled in for myocardial infarction eventually peaking his troponin at greater than 20. A 2-D echocardiogram was performed on October 20 showing an ejection fraction of 45-50% with inferior hypokinesis. In the setting of ongoing hypotension and cardiogenic shock, beta blocker and ACE inhibitor therapy or not able to be immediately initiated. He was maintained on aspirin and brilinta. By October 21, patient's blood pressure had stabilized and his vasopressor therapy was able to be weaned off as was intra-aortic balloon pump, which was subsequently discontinued. He was also able to be extubated on October 21. As his hemodynamics continued to improve, oral beta blocker therapy was initiated and subsequently titrated. ACE inhibitor therapy was also able to be added as patient developed hypertension. He was able to be transferred out to the floor on October 23, and has been seen by cardiac rehabilitation. He has been counseled on the importance of smoking (both tobacco and marijuana) cessation and has been able to ambulate without recurrent symptoms or limitations. He will be discharged home today in good condition and followup arranged for him within the next week.   Discharge Vitals Blood pressure 138/85, pulse 92, temperature 98.3 F (36.8 C), temperature source Oral, resp. rate 20, height 5'  10" (1.778 m), weight 194 lb 7.1 oz (88.2 kg), SpO2 94.00%.  Filed Weights   01/13/13 0322 01/14/13 0400  Weight: 195 lb (88.451 kg) 194 lb 7.1 oz (88.2 kg)   Labs  CBC  Recent Labs  01/15/13 0454 01/16/13 0438  WBC 12.3* 12.7*  HGB 12.0* 12.8*  HCT 35.2* 36.9*  MCV 100.3* 100.0  PLT 148* 173   Basic Metabolic Panel  Recent Labs  01/15/13 0454 01/16/13 0438  NA 140 139  K 3.6 3.9  CL 106 106  CO2 23 23  GLUCOSE 99 96  BUN 9 10  CREATININE 0.66 0.76  CALCIUM 8.8 8.9  MG 2.0  --   PHOS 2.4  --    Cardiac Enzymes Lab Results  Component Value Date   CKTOTAL 9032* 01/14/2013   CKMB 128.7* 01/14/2013   TROPONINI >20.00* 01/13/2013    Fasting Lipid Panel Lab Results  Component Value Date   CHOL 148 01/13/2013   HDL 31* 01/13/2013   LDLCALC 81 01/13/2013   TRIG 178* 01/13/2013   CHOLHDL 4.8 01/13/2013   Disposition  Pt is being discharged home today in good condition.  Follow-up Plans & Appointments  Follow-up Information   Follow up with Antoine Poche, MD On 01/24/2013. (11:40)    Specialty:  Cardiology   Contact information:   [address] [place] [address] [phone]      Discharge Medications    Medication List    STOP taking these medications       bisoprolol-hydrochlorothiazide 10-6.25 MG per tablet  Commonly known as:  ZIAC     ibuprofen 200 MG tablet  Commonly known as:  ADVIL,MOTRIN      TAKE these medications       aspirin 81 MG chewable tablet  Chew 1 tablet (81 mg total) by mouth daily.     atorvastatin 80 MG tablet  Commonly known as:  LIPITOR  Take 1 tablet (80 mg total) by mouth daily at 6 PM.     lisinopril 10 MG tablet  Commonly known as:  PRINIVIL,ZESTRIL  Take 1 tablet (10 mg total) by mouth daily.     metoprolol 50 MG tablet  Commonly known as:  LOPRESSOR  Take 1 tablet (50 mg total) by mouth 2 (two) times daily.     nicotine 21 mg/24hr patch  Commonly known as:  NICODERM CQ - dosed  in mg/24 hours  Place 1 patch onto the skin daily. Go from 21 mg to 14 mg to 7 mg patch every 2 weeks, then stop. Do NOT smoke while wearing the patch.     nitroGLYCERIN 0.4 MG SL tablet  Commonly known as:  NITROSTAT  Place 1 tablet (0.4 mg total) under the tongue every 5 (five) minutes as needed for chest pain.     pantoprazole 40 MG tablet  Commonly known as:  PROTONIX  Take 1 tablet (40 mg total) by mouth 2 (two) times daily.     Ticagrelor 90 MG Tabs tablet  Commonly known as:  BRILINTA  Take 1 tablet (90 mg total) by mouth 2 (two) times daily.      Outstanding Labs/Studies  F/u lipids/lft's in 8 wks.  Duration of Discharge Encounter   Greater than 30 minutes including physician time.  Signed, Nicolasa Ducking NP 01/17/2013, 9:34 AM

## 2013-01-17 NOTE — Progress Notes (Addendum)
    Subjective:  Feels good this am. No chest pani or dyspnea.   Objective:  Vital Signs in the last 24 hours: Temp:  [97.7 F (36.5 C)-98.3 F (36.8 C)] 98.3 F (36.8 C) (10/24 0552) Pulse Rate:  [79-92] 92 (10/24 0552) Resp:  [18-20] 20 (10/23 2048) BP: (136-152)/(85-101) 138/85 mmHg (10/24 0552) SpO2:  [94 %-99 %] 94 % (10/24 0552)  Intake/Output from previous day: 10/23 0701 - 10/24 0700 In: 360 [P.O.:360] Out: -   Physical Exam: Pt is alert and oriented, NAD HEENT: normal Neck: JVP - normal, carotids 2+= without bruits Lungs: CTA bilaterally CV: RRR without murmur or gallop Abd: soft, NT, Positive BS, no hepatomegaly Ext: no C/C/E, distal pulses intact and equal Skin: marks noted from defib pads/shocks   Lab Results:  Recent Labs  01/15/13 0454 01/16/13 0438  WBC 12.3* 12.7*  HGB 12.0* 12.8*  PLT 148* 173    Recent Labs  01/15/13 0454 01/16/13 0438  NA 140 139  K 3.6 3.9  CL 106 106  CO2 23 23  GLUCOSE 99 96  BUN 9 10  CREATININE 0.66 0.76   No results found for this basename: TROPONINI, CK, MB,  in the last 72 hours  Cardiac Studies: No new studies  Tele: Personally reviewed. Sinus rhythm.  Assessment/Plan:  1. Acute inferoposterior MI, s/p primary PCI 2. VT, secondary to #1 3. Tobacco abuse - motivated to quit 4. Esophageal stricture 5. HTN, essential  Pt now stable for discharge. He has received extensive education and post-MI instruction. He will follow-up in [place]. He is motivated to quit smoking. Needs f/u 1-2 weeks. Meds reviewed and adjusted (metoprolol increased for better BP control). Will need continued outpatient titration of meds. On statin, ASA, brilinta, metoprolol, lisinopril. Nicotine patch at discharge.  Thinks he can quit cigarettes. Has smoked marijuana after work for 30 years and not sure he can quit. Will try his best.  Tonny Bollman, M.D. 01/17/2013, 6:31 AM

## 2013-01-21 ENCOUNTER — Telehealth: Payer: Self-pay | Admitting: Cardiology

## 2013-01-21 NOTE — Telephone Encounter (Signed)
Called pt and left message for him to return my call 

## 2013-01-21 NOTE — Telephone Encounter (Signed)
Patient contacted regarding discharge from hospital on discharge date.   Patient understands to follow up with provider provider on date at time at office location. Yes Patient understands discharge instructions? yes Patient understands medications and regiment? yes Patient understands to bring all medications to this visit? yes   Patient had a question about his cardiac rehab. I informed him that he would fill out some paper work on Friday at his OV and then we will contact cardiac rehab and then cardiac rehab would contact him.

## 2013-01-24 ENCOUNTER — Encounter: Payer: Self-pay | Admitting: Cardiology

## 2013-01-24 ENCOUNTER — Ambulatory Visit (INDEPENDENT_AMBULATORY_CARE_PROVIDER_SITE_OTHER): Payer: Managed Care, Other (non HMO) | Admitting: Cardiology

## 2013-01-24 ENCOUNTER — Other Ambulatory Visit: Payer: Self-pay | Admitting: *Deleted

## 2013-01-24 VITALS — BP 124/79 | HR 66 | Ht 70.0 in | Wt 182.0 lb

## 2013-01-24 DIAGNOSIS — R002 Palpitations: Secondary | ICD-10-CM

## 2013-01-24 DIAGNOSIS — I2510 Atherosclerotic heart disease of native coronary artery without angina pectoris: Secondary | ICD-10-CM

## 2013-01-24 DIAGNOSIS — I10 Essential (primary) hypertension: Secondary | ICD-10-CM

## 2013-01-24 DIAGNOSIS — E785 Hyperlipidemia, unspecified: Secondary | ICD-10-CM

## 2013-01-24 MED ORDER — METOPROLOL TARTRATE 50 MG PO TABS: 75.0000 mg | ORAL_TABLET | Freq: Two times a day (BID) | ORAL | Status: DC

## 2013-01-24 NOTE — Patient Instructions (Signed)
   Increase Metoprolol tart to 75mg  twice a day  - new sent to pharm Continue all other medications.   Your physician has recommended that you wear a 14 day event monitor. Event monitors are medical devices that record the heart's electrical activity. Doctors most often [place] these monitors to diagnose arrhythmias. Arrhythmias are problems with the speed or rhythm of the heartbeat. The monitor is a small, portable device. You can wear one while you do your normal daily activities. This is usually used to diagnose what is causing palpitations/syncope (passing out).  This will be mailed to your home. Office will contact with results via phone or letter.   Please call office in one week to give update on how doing.   Follow up in  3-4 weeks

## 2013-01-24 NOTE — Progress Notes (Addendum)
Clinical Summary Mr. Mcburney is a 47 y.o.male seen as new patient today  1. CAD - recent admit to [facility] 01/13/13 to 01/17/13 with STEMI, DES to RCA.  - patient presented in cardiogenic shock, suffered VT/VF arrest requiring multiple defibrillations and IV amiodarone. Following PCI required balloon pump and vasopressors and temporary transvenous pacing. - Echo LVEF 45-50%, inferior hypokinesis, normal diastolic function  - reports some occasional feeling of heart thumping at home, reports 2-3 episodes since being home. Lasts just a few minutes, no assoc SOB, no dizziness.  - compliant with meds: ASA, lipitor, lisinopril, metoprolol, ticagrelor  2. HTN - does not check at home - compliant with meds  3. Tobacco abuse - still smoking, down to 3-4 cigs day. Using electronic cigarettes.  - did not fill Rx for patches  4. Hyperlipidemia - compliant with statin    Past Medical History  Diagnosis Date  . Coronary artery disease     a. 12/2012 Inf STEMI/Cath/PCI: LM nl, LAD nl, D1 50ost, D2 min irregs, D3 small, LCX 80-38m, OM1/2/3 min irregs, RI 20p, RCA 50p/100d (3.5x18 Xience DEs),PDA/PL nl, EF 55% - complicated by VF/CGS/IABP/VDRF  . Hypertension   . Ischemic cardiomyopathy     a. 12/2012 Echo: EF 45-50%, basal inf, inflat, mid inf HK, mildly reduced RV fxn.  . Tobacco abuse   . Marijuana abuse   . Obstructive sleep apnea   . GERD (gastroesophageal reflux disease)   . Hiatal hernia      No Known Allergies   Current Outpatient Prescriptions  Medication Sig Dispense Refill  . aspirin 81 MG chewable tablet Chew 1 tablet (81 mg total) by mouth daily.      Marland Kitchen atorvastatin (LIPITOR) 80 MG tablet Take 1 tablet (80 mg total) by mouth daily at 6 PM.  30 tablet  11  . lisinopril (PRINIVIL,ZESTRIL) 10 MG tablet Take 1 tablet (10 mg total) by mouth daily.  30 tablet  6  . metoprolol tartrate (LOPRESSOR) 50 MG tablet Take 1 tablet (50 mg total) by mouth 2 (two) times daily.  60  tablet  6  . nicotine (NICODERM CQ - DOSED IN MG/24 HOURS) 21 mg/24hr patch Place 1 patch onto the skin daily. Go from 21 mg to 14 mg to 7 mg patch every 2 weeks, then stop. Do NOT smoke while wearing the patch.  14 patch  0  . nitroGLYCERIN (NITROSTAT) 0.4 MG SL tablet Place 1 tablet (0.4 mg total) under the tongue every 5 (five) minutes as needed for chest pain.  25 tablet  3  . pantoprazole (PROTONIX) 40 MG tablet Take 1 tablet (40 mg total) by mouth 2 (two) times daily.  60 tablet  11  . Ticagrelor (BRILINTA) 90 MG TABS tablet Take 1 tablet (90 mg total) by mouth 2 (two) times daily.  60 tablet  11   No current facility-administered medications for this visit.     Past Surgical History  Procedure Laterality Date  . Dental surgery    . Nasal septum surgery    . Coronary angioplasty  01/13/2013    STENT TO RCA BY DR COOPER     No Known Allergies    No family history on file.   Social History Mr. Asch reports that he quit smoking 11 days ago. His smoking use included Cigarettes. He smoked 0.00 packs per day for 31 years. He has never used smokeless tobacco. Mr. Barajas reports that he drinks alcohol.   Review of  Systems CONSTITUTIONAL: No weight loss, fever, chills, weakness or fatigue.  HEENT: Eyes: No visual loss, blurred vision, double vision or yellow sclerae.No hearing loss, sneezing, congestion, runny nose or sore throat.  SKIN: No rash or itching.  CARDIOVASCULAR: per HPI RESPIRATORY: per HPI GASTROINTESTINAL: No anorexia, nausea, vomiting or diarrhea. No abdominal pain or blood.  GENITOURINARY: No burning on urination, no polyuria NEUROLOGICAL: No headache, dizziness, syncope, paralysis, ataxia, numbness or tingling in the extremities. No change in bowel or bladder control.  MUSCULOSKELETAL: No muscle, back pain, joint pain or stiffness.  LYMPHATICS: No enlarged nodes. No history of splenectomy.  PSYCHIATRIC: No history of depression or anxiety.  ENDOCRINOLOGIC:  No reports of sweating, cold or heat intolerance. No polyuria or polydipsia.  Marland Kitchen   Physical Examination p 66 bp 124/79 Wt 182 lbs BMI 26 Gen: resting comfortably, no acute distress HEENT: no scleral icterus, pupils equal round and reactive, no palptable cervical adenopathy,  CV: RRR, no m/r/g, no JVD, no carotid bruits Resp: Clear to auscultation bilaterally GI: abdomen is soft, non-tender, non-distended, normal bowel sounds, no hepatosplenomegaly MSK: extremities are warm, no edema.  Skin: warm, no rash Neuro:  no focal deficits Psych: appropriate affect   Diagnostic Studies Hemodynamics:  AO: 119/82 mmHg  LV: 119/15 mmHg  LVEDP: 28 mmHg  Coronary angiography:  Coronary dominance: right  Left Main: normal  Left Anterior Descending (LAD): normal in size with minor irregularities throughout its course but no evidence of obstructive disease.  1st diagonal (D1): small in size with 50% ostial stenosis.  2nd diagonal (D2): small in size with minor irregularities.  3rd diagonal (D3): very small in size.  Circumflex (LCx): normal in size and nondominant. There is an 80-90% mid stenosis at the origin of OM 2.  1st obtuse marginal: small in size with minor irregularities.  2nd obtuse marginal: normal in size with no significant disease.  3rd obtuse marginal: medium in size with no significant disease.  Ramus Intermedius: normal in size with 20% proximal stenosis.  Right Coronary Artery: large in size and dominant. There is 50% proximal stenosis. The vessel is occluded distally with large thrombus burden.  Posterior descending artery: normal  Posterior AV segment: normal  Posterolateral branchs: normal Left ventriculography: Left ventricular systolic function is normal , LVEF is estimated at 55 %, there is no significant mitral regurgitation . mild inferior wall hypokinesis   PC Data:  Vessel - distal RCA/Segment - 3  Percent Stenosis (pre) 100%  TIMI-flow 0  Stent 3.5 x 18 mm Xience  expedition drug-eluting stent  Percent Stenosis (post) 0%  TIMI-flow (post) 3  Final Conclusions:  1. Refractory ventricular fibrillation and cardiogenic shock due to inferior ST elevation myocardial infarction. Occluded distal right coronary artery. There is significant bifurcation disease in the mid left circumflex supplying an overall small to medium sized territory.  2. Profound hypotension on presentation with subsequent improvement with revascularization and intra-aortic balloon pump placement.  3. Normal LV systolic function with moderately elevated left ventricular end-diastolic pressure.  4. Successful PCI and drug-eluting stent placement to the right coronary artery.   01/13/13 Echo:  LVEF 45-50%, inferior hypokinesis, normal diastolic function  01/24/13 Clinic EKG: sinus rhythm, normal axis, Q waves inferior leads with TWIs Assessment and Plan  1. CAD - recent STEMI complicated by cardiogenic shock and VT/VF arrest, s/p PCI to RCA - no current symptoms, describes some occasional palpitations - obtain event monitor to evaluate for possible arrhythmia, especially ventricular ectopy given his history of  MI and cardiac arrest. Increase metoprolol to 75mg  bid to suppress any arrhythmic focus.   2. HTN - at goal, continue current meds  3. Hyperlipidemia - continue current statin  4. Tobacco - down to 3 cigarettes per day, encouraged continued use of electronic cigarette with goals to get off tobacco completely.       Antoine Poche, M.D., F.A.C.C.

## 2013-01-29 DIAGNOSIS — R002 Palpitations: Secondary | ICD-10-CM

## 2013-01-31 ENCOUNTER — Telehealth: Payer: Self-pay | Admitting: Cardiology

## 2013-01-31 NOTE — Telephone Encounter (Signed)
Patient called back and wanting to know if any of his medications could cause excessive itching he states that he has been itching since leaving the hospital.

## 2013-01-31 NOTE — Telephone Encounter (Signed)
Patient called with a few questions.Ppt wants to know if it would be ok to drive now since as of today it has been 2 weeks; pt also wants to know what his restrictions are to lifting and all other daily activity restrictions; he also was questioning when it would be ok to go back to work, Dr. Copper said he could go back on 11-16 but pt follow up appt with MD is 11-21; pt was also wants to know if he can go to [facility]; details and cardiac rehab?

## 2013-02-03 NOTE — Telephone Encounter (Signed)
LM for pt to returncall

## 2013-02-03 NOTE — Telephone Encounter (Signed)
The patient may go back to work on the 16th per Dr Randolm Idol recs. He has no further lifting restrictions,and can resume his regular activities. Please ask him if he is still itching, if so he can stop his atorvastatin until I see him in follow up on the 21st.    Dina Rich MD

## 2013-02-04 ENCOUNTER — Ambulatory Visit: Payer: Managed Care, Other (non HMO) | Admitting: Cardiovascular Disease

## 2013-02-05 NOTE — Telephone Encounter (Signed)
Attempted to call patient, no answer left message for pt to return call

## 2013-02-06 NOTE — Telephone Encounter (Signed)
Pt informed. Pt stated that the itching has gotten better and that it is just his stomach and chest area. Pt also stated that he didn't believe his company had light duty. I emphasized to pt that doctor stated in his note that he can resume regular activities and no lifting restrictions. Pt verbalized understanding.

## 2013-02-11 ENCOUNTER — Telehealth: Payer: Self-pay | Admitting: Cardiology

## 2013-02-11 NOTE — Telephone Encounter (Signed)
LM for pt to returncall

## 2013-02-11 NOTE — Telephone Encounter (Signed)
Timothy Walker called stating that he has had problems with the monitor . States that he put baby oil on his skin this weekend and He could not wear the monitor for approximately 30 hours. He states that the company contacted him today. He is wanting To know if Dr.Branch has enough information on the monitor or does he need to continue to wear it.

## 2013-02-11 NOTE — Telephone Encounter (Signed)
Printed monitor results from ecardio site and showed them to Dr. Wyline Mood. Dr. Wyline Mood confirmed that there was enough information on monitor report that pt could remove monitor and send it back. Informed pt of this pt verbalized understanding and said that he would remove monitor and send it back.

## 2013-02-14 ENCOUNTER — Encounter: Payer: Self-pay | Admitting: Cardiology

## 2013-02-14 ENCOUNTER — Ambulatory Visit (INDEPENDENT_AMBULATORY_CARE_PROVIDER_SITE_OTHER): Payer: Managed Care, Other (non HMO) | Admitting: Cardiology

## 2013-02-14 VITALS — BP 137/91 | HR 83 | Ht 70.0 in | Wt 180.1 lb

## 2013-02-14 DIAGNOSIS — R002 Palpitations: Secondary | ICD-10-CM

## 2013-02-14 MED ORDER — METOPROLOL TARTRATE 100 MG PO TABS: 100.0000 mg | ORAL_TABLET | Freq: Two times a day (BID) | ORAL | Status: DC

## 2013-02-14 NOTE — Patient Instructions (Signed)
Your physician recommends that you schedule a follow-up appointment in: 3 months with Dr. Wyline Mood. This appointment will be scheduled today before you leave.  Your physician has recommended you make the following change in your medication:  Increase Metoprolol 100 MG twice daily  Continue all other medications the same.

## 2013-02-14 NOTE — Progress Notes (Signed)
Clinical Summary Mr. Monts is a 47 y.o.male seen today for follow up of palpitations. For more detailed note regarding his multiple medical problems refer to my note dated 01/24/13.  1. Palpitations-  - reports some occasional feeling of heart thumping at home, reports. Lasts just a few minutes, no assoc SOB, no dizziness. Seems to occur most often at rest.  - since last visit he wore a monitor, we increased his metorpol to 75mg  bid last visit as well and he notes some improvement in symptoms.      Past Medical History  Diagnosis Date  . Coronary artery disease     a. 12/2012 Inf STEMI/Cath/PCI: LM nl, LAD nl, D1 50ost, D2 min irregs, D3 small, LCX 80-78m, OM1/2/3 min irregs, RI 20p, RCA 50p/100d (3.5x18 Xience DEs),PDA/PL nl, EF 55% - complicated by VF/CGS/IABP/VDRF  . Hypertension   . Ischemic cardiomyopathy     a. 12/2012 Echo: EF 45-50%, basal inf, inflat, mid inf HK, mildly reduced RV fxn.  . Tobacco abuse   . Marijuana abuse   . Obstructive sleep apnea   . GERD (gastroesophageal reflux disease)   . Hiatal hernia      No Known Allergies   Current Outpatient Prescriptions  Medication Sig Dispense Refill  . aspirin 81 MG chewable tablet Chew 1 tablet (81 mg total) by mouth daily.      Marland Kitchen atorvastatin (LIPITOR) 80 MG tablet Take 1 tablet (80 mg total) by mouth daily at 6 PM.  30 tablet  11  . lisinopril (PRINIVIL,ZESTRIL) 10 MG tablet Take 1 tablet (10 mg total) by mouth daily.  30 tablet  6  . metoprolol (LOPRESSOR) 50 MG tablet Take 1.5 tablets (75 mg total) by mouth 2 (two) times daily.  90 tablet  6  . nicotine (NICODERM CQ - DOSED IN MG/24 HOURS) 21 mg/24hr patch Place 1 patch onto the skin daily. Go from 21 mg to 14 mg to 7 mg patch every 2 weeks, then stop. Do NOT smoke while wearing the patch.  14 patch  0  . nitroGLYCERIN (NITROSTAT) 0.4 MG SL tablet Place 1 tablet (0.4 mg total) under the tongue every 5 (five) minutes as needed for chest pain.  25 tablet  3  .  pantoprazole (PROTONIX) 40 MG tablet Take 1 tablet (40 mg total) by mouth 2 (two) times daily.  60 tablet  11  . Ticagrelor (BRILINTA) 90 MG TABS tablet Take 1 tablet (90 mg total) by mouth 2 (two) times daily.  60 tablet  11   No current facility-administered medications for this visit.     Past Surgical History  Procedure Laterality Date  . Dental surgery    . Nasal septum surgery    . Coronary angioplasty  01/13/2013    STENT TO RCA BY DR COOPER     No Known Allergies    No family history on file.   Social History Mr. Stamey reports that he has been smoking Cigarettes.  He started smoking about 31 years ago. He has been smoking about 0.00 packs per day for the past 31 years. He has never used smokeless tobacco. Mr. Nieto reports that he drinks alcohol.   Review of Systems CONSTITUTIONAL: No weight loss, fever, chills, weakness or fatigue.  HEENT: Eyes: No visual loss, blurred vision, double vision or yellow sclerae.No hearing loss, sneezing, congestion, runny nose or sore throat.  SKIN: No rash or itching.  CARDIOVASCULAR: per HPI RESPIRATORY: per HPI  GASTROINTESTINAL: No anorexia,  nausea, vomiting or diarrhea. No abdominal pain or blood.  GENITOURINARY: No burning on urination, no polyuria NEUROLOGICAL: No headache, dizziness, syncope, paralysis, ataxia, numbness or tingling in the extremities. No change in bowel or bladder control.  MUSCULOSKELETAL: No muscle, back pain, joint pain or stiffness.  LYMPHATICS: No enlarged nodes. No history of splenectomy.  PSYCHIATRIC: No history of depression or anxiety.  ENDOCRINOLOGIC: No reports of sweating, cold or heat intolerance. No polyuria or polydipsia.  Marland Kitchen   Physical Examination  Gen: resting comfortably, no acute distress HEENT: no scleral icterus, pupils equal round and reactive, no palptable cervical adenopathy,  CV: RRR, no m/r/g, no JVD, no carotid bruits Resp: Clear to auscultation bilaterally GI: abdomen is  soft, non-tender, non-distended, normal bowel sounds, no hepatosplenomegaly MSK: extremities are warm, no edema.  Skin: warm, no rash Neuro:  no focal deficits Psych: appropriate affect   Diagnostic Studies Hemodynamics:  AO: 119/82 mmHg  LV: 119/15 mmHg  LVEDP: 28 mmHg  Coronary angiography:  Coronary dominance: right  Left Main: normal  Left Anterior Descending (LAD): normal in size with minor irregularities throughout its course but no evidence of obstructive disease.  1st diagonal (D1): small in size with 50% ostial stenosis.  2nd diagonal (D2): small in size with minor irregularities.  3rd diagonal (D3): very small in size.  Circumflex (LCx): normal in size and nondominant. There is an 80-90% mid stenosis at the origin of OM 2.  1st obtuse marginal: small in size with minor irregularities.  2nd obtuse marginal: normal in size with no significant disease.  3rd obtuse marginal: medium in size with no significant disease.  Ramus Intermedius: normal in size with 20% proximal stenosis.  Right Coronary Artery: large in size and dominant. There is 50% proximal stenosis. The vessel is occluded distally with large thrombus burden.  Posterior descending artery: normal  Posterior AV segment: normal  Posterolateral branchs: normal Left ventriculography: Left ventricular systolic function is normal , LVEF is estimated at 55 %, there is no significant mitral regurgitation . mild inferior wall hypokinesis  PC Data:  Vessel - distal RCA/Segment - 3  Percent Stenosis (pre) 100%  TIMI-flow 0  Stent 3.5 x 18 mm Xience expedition drug-eluting stent  Percent Stenosis (post) 0%  TIMI-flow (post) 3  Final Conclusions:  1. Refractory ventricular fibrillation and cardiogenic shock due to inferior ST elevation myocardial infarction. Occluded distal right coronary artery. There is significant bifurcation disease in the mid left circumflex supplying an overall small to medium sized territory.  2.  Profound hypotension on presentation with subsequent improvement with revascularization and intra-aortic balloon pump placement.  3. Normal LV systolic function with moderately elevated left ventricular end-diastolic pressure.  4. Successful PCI and drug-eluting stent placement to the right coronary artery.   01/13/13 Echo:  LVEF 45-50%, inferior hypokinesis, normal diastolic function  01/24/13   14 day event monitor: no significant arrhythmias, palpitations correlate with sinus rhythm, occasional PVCs.    Assessment and Plan  1. Palpitations - event monitor shows symptoms correlate with normal sinus rhythm, occasional PVCs. No significant arrhythmias, which was the concern given his recent MI complicated with VT/VF arrest - symptoms improving with increased lopressor, will increase to 100mg  bid.   Patient will follow up in 3 months      Antoine Poche, M.D., F.A.C.C.

## 2013-05-23 ENCOUNTER — Ambulatory Visit (INDEPENDENT_AMBULATORY_CARE_PROVIDER_SITE_OTHER): Payer: Managed Care, Other (non HMO) | Admitting: Cardiology

## 2013-05-23 ENCOUNTER — Encounter: Payer: Self-pay | Admitting: Cardiology

## 2013-05-23 VITALS — BP 138/93 | HR 72 | Ht 72.0 in | Wt 180.8 lb

## 2013-05-23 DIAGNOSIS — R002 Palpitations: Secondary | ICD-10-CM

## 2013-05-23 DIAGNOSIS — I10 Essential (primary) hypertension: Secondary | ICD-10-CM

## 2013-05-23 DIAGNOSIS — I2510 Atherosclerotic heart disease of native coronary artery without angina pectoris: Secondary | ICD-10-CM

## 2013-05-23 DIAGNOSIS — E785 Hyperlipidemia, unspecified: Secondary | ICD-10-CM

## 2013-05-23 DIAGNOSIS — R0602 Shortness of breath: Secondary | ICD-10-CM

## 2013-05-23 MED ORDER — NICOTINE 14 MG/24HR TD PT24: 14.0000 mg | MEDICATED_PATCH | TRANSDERMAL | Status: DC

## 2013-05-23 NOTE — Patient Instructions (Signed)
Your physician recommends that you schedule a follow-up appointment in: 6 months. You will receive a reminder letter in the mail in about 4 months reminding you to call and schedule your appointment. If you don't receive this letter, please contact our office. Your physician recommends that you continue on your current medications as directed. Please refer to the Current Medication list given to you today. Your physician has recommended that you have a pulmonary function test. Pulmonary Function Tests are a group of tests that measure how well air moves in and out of your lungs. Your physician has prescribed you nicotine patches today to help you stop smoking. This prescription has been sent to your pharmacy.

## 2013-05-23 NOTE — Progress Notes (Signed)
Clinical Summary Timothy Walker is a 48 y.o.male seen today for follow up for the following medical problems.   1. CAD  - admit to [facility] 01/13/13 to 01/17/13 with STEMI, DES to RCA.  - patient presented in cardiogenic shock, suffered VT/VF arrest requiring multiple defibrillations and IV amiodarone. Following PCI required balloon pump and vasopressors and temporary transvenous pacing.  - Echo LVEF 45-50%, inferior hypokinesis, normal diastolic function  - compliant with meds: ASA, lipitor, lisinopril, metoprolol, ticagrelor  - occas sharp to dull pain, lasted just a few seconds. After one episode belched and symptoms improved.   2. HTN  - does not check at home  - compliant with meds   3. Tobacco abuse  - up to 1/2 ppd  - Using electronic cigarettes without much help - did not fill Rx for patches   4. Hyperlipidemia  - compliant with statin  5. Palpitations-  - reports some occasional feeling of heart thumping at home, reports. Lasts just a few minutes, no assoc SOB, no dizziness. Seems to occur most often at rest.  - since last visit he wore a monitor, we increased his metorpol to 100 mg bid with improved symptoms - recent event monitor shows symptoms correlate with NSR, occasional PVCs.    6. Hand numbness - right arm pins and needles feeling, pain can radiate down right arm. Similar milder symptoms on left. Does have some occasional neck pain as well.   Past Medical History  Diagnosis Date  . Coronary artery disease     a. 12/2012 Inf STEMI/Cath/PCI: LM nl, LAD nl, D1 50ost, D2 min irregs, D3 small, LCX 80-62m, OM1/2/3 min irregs, RI 20p, RCA 50p/100d (3.5x18 Xience DEs),PDA/PL nl, EF [id number] - complicated by VF/CGS/IABP/VDRF  . Hypertension   . Ischemic cardiomyopathy     a. 12/2012 Echo: EF 45-50%, basal inf, inflat, mid inf HK, mildly reduced RV fxn.  . Tobacco abuse   . Marijuana abuse   . Obstructive sleep apnea   . GERD (gastroesophageal reflux disease)   .  Hiatal hernia      No Known Allergies   Current Outpatient Prescriptions  Medication Sig Dispense Refill  . aspirin 81 MG chewable tablet Chew 1 tablet (81 mg total) by mouth daily.      Marland Kitchen atorvastatin (LIPITOR) 80 MG tablet Take 1 tablet (80 mg total) by mouth daily at 6 PM.  30 tablet  11  . doxycycline (ORACEA) 40 MG capsule Take 40 mg by mouth every morning.      Marland Kitchen lisinopril (PRINIVIL,ZESTRIL) 10 MG tablet Take 1 tablet (10 mg total) by mouth daily.  30 tablet  6  . metoprolol (LOPRESSOR) 100 MG tablet Take 1 tablet (100 mg total) by mouth 2 (two) times daily.  60 tablet  6  . metroNIDAZOLE (METROCREAM) 0.75 % cream Apply 1 application topically daily.       . nicotine (NICODERM CQ - DOSED IN MG/24 HOURS) 21 mg/24hr patch Place 1 patch onto the skin daily. Go from 21 mg to 14 mg to 7 mg patch every 2 weeks, then stop. Do NOT smoke while wearing the patch.  14 patch  0  . nitroGLYCERIN (NITROSTAT) 0.4 MG SL tablet Place 1 tablet (0.4 mg total) under the tongue every 5 (five) minutes as needed for chest pain.  25 tablet  3  . pantoprazole (PROTONIX) 40 MG tablet Take 1 tablet (40 mg total) by mouth 2 (two) times daily.  60 tablet  11  . Ticagrelor (BRILINTA) 90 MG TABS tablet Take 1 tablet (90 mg total) by mouth 2 (two) times daily.  60 tablet  11   No current facility-administered medications for this visit.     Past Surgical History  Procedure Laterality Date  . Dental surgery    . Nasal septum surgery    . Coronary angioplasty  01/13/2013    STENT TO RCA BY DR COOPER     No Known Allergies    No family history on file.   Social History Timothy Walker reports that he has been smoking Cigarettes.  He started smoking about 31 years ago. He has a 15.5 pack-year smoking history. He has never used smokeless tobacco. Timothy Walker reports that he drinks alcohol.   Review of Systems CONSTITUTIONAL: No weight loss, fever, chills, weakness or fatigue.  HEENT: Eyes: No visual loss,  blurred vision, double vision or yellow sclerae.No hearing loss, sneezing, congestion, runny nose or sore throat.  SKIN: No rash or itching.  CARDIOVASCULAR: per HPI RESPIRATORY: No shortness of breath, cough or sputum.  GASTROINTESTINAL: No anorexia, nausea, vomiting or diarrhea. No abdominal pain or blood.  GENITOURINARY: No burning on urination, no polyuria NEUROLOGICAL: No headache, dizziness, syncope, paralysis, ataxia, numbness or tingling in the extremities. No change in bowel or bladder control.  MUSCULOSKELETAL: hand/arm numbness LYMPHATICS: No enlarged nodes. No history of splenectomy.  PSYCHIATRIC: No history of depression or anxiety.  ENDOCRINOLOGIC: No reports of sweating, cold or heat intolerance. No polyuria or polydipsia.  Marland Kitchen   Physical Examination p 72 bp 138/93 Wt 180 lbs BMI 25 Gen: resting comfortably, no acute distress HEENT: no scleral icterus, pupils equal round and reactive, no palptable cervical adenopathy,  CV: RRR, no m/r/g, no JVD, no carotid bruits Resp: Clear to auscultation bilaterally GI: abdomen is soft, non-tender, non-distended, normal bowel sounds, no hepatosplenomegaly MSK: extremities are warm, no edema.  Skin: warm, no rash Neuro:  no focal deficits Psych: appropriate affect   Diagnostic Studies Hemodynamics:  AO: 119/82 mmHg  LV: 119/15 mmHg  LVEDP: 28 mmHg  Coronary angiography:  Coronary dominance: right  Left Main: normal  Left Anterior Descending (LAD): normal in size with minor irregularities throughout its course but no evidence of obstructive disease.  1st diagonal (D1): small in size with 50% ostial stenosis.  2nd diagonal (D2): small in size with minor irregularities.  3rd diagonal (D3): very small in size.  Circumflex (LCx): normal in size and nondominant. There is an 80-90% mid stenosis at the origin of OM 2.  1st obtuse marginal: small in size with minor irregularities.  2nd obtuse marginal: normal in size with no significant  disease.  3rd obtuse marginal: medium in size with no significant disease.  Ramus Intermedius: normal in size with 20% proximal stenosis.  Right Coronary Artery: large in size and dominant. There is 50% proximal stenosis. The vessel is occluded distally with large thrombus burden.  Posterior descending artery: normal  Posterior AV segment: normal  Posterolateral branchs: normal Left ventriculography: Left ventricular systolic function is normal , LVEF is estimated at 55 %, there is no significant mitral regurgitation . mild inferior wall hypokinesis  PC Data:  Vessel - distal RCA/Segment - 3  Percent Stenosis (pre) 100%  TIMI-flow 0  Stent 3.5 x 18 mm Xience expedition drug-eluting stent  Percent Stenosis (post) 0%  TIMI-flow (post) 3  Final Conclusions:  1. Refractory ventricular fibrillation and cardiogenic shock due to inferior ST elevation myocardial infarction. Occluded distal right  coronary artery. There is significant bifurcation disease in the mid left circumflex supplying an overall small to medium sized territory.  2. Profound hypotension on presentation with subsequent improvement with revascularization and intra-aortic balloon pump placement.  3. Normal LV systolic function with moderately elevated left ventricular end-diastolic pressure.  4. Successful PCI and drug-eluting stent placement to the right coronary artery.   01/13/13 Echo:  LVEF 45-50%, inferior hypokinesis, normal diastolic function   38/93/73 14 day event monitor: no significant arrhythmias, palpitations correlate with sinus rhythm, occasional PVCs.      Assessment and Plan  1. CAD  - recent STEMI complicated by cardiogenic shock and VT/VF arrest, s/p PCI to RCA  - no current symptoms - continue current meds, DAPT at least until 12/2013  2. HTN  - at goal, continue current meds   3. Hyperlipidemia  - continue current statin   4. Tobacco  - encouraged cessation, gave another Rx for nicotine  patches - describes some occasional SOB/wheezing, will obtain PFTs  5. Palpitations  - event monitor shows symptoms correlate with normal sinus rhythm, occasional PVCs. No significant arrhythmias, which was the concern given his recent MI complicated with VT/VF arrest  - symptoms improved with increased lopressor  Follow up 6 months     Arnoldo Lenis, M.D., F.A.C.C.

## 2013-05-30 ENCOUNTER — Ambulatory Visit (HOSPITAL_COMMUNITY): Admission: RE | Admit: 2013-05-30 | Payer: Managed Care, Other (non HMO) | Source: Ambulatory Visit

## 2013-06-04 ENCOUNTER — Encounter: Payer: Self-pay | Admitting: Cardiology

## 2013-06-04 ENCOUNTER — Telehealth: Payer: Self-pay | Admitting: Cardiology

## 2013-06-04 NOTE — Telephone Encounter (Signed)
Timothy Walker missed his appointment for the PFT's. I have been unable to reach him by phone. Letter mailed to his home address.

## 2013-06-10 ENCOUNTER — Encounter (HOSPITAL_COMMUNITY): Payer: Self-pay | Admitting: Emergency Medicine

## 2013-06-10 ENCOUNTER — Other Ambulatory Visit: Payer: Self-pay

## 2013-06-10 ENCOUNTER — Encounter: Payer: Self-pay | Admitting: *Deleted

## 2013-06-10 ENCOUNTER — Emergency Department (HOSPITAL_COMMUNITY): Payer: Managed Care, Other (non HMO)

## 2013-06-10 ENCOUNTER — Emergency Department (HOSPITAL_COMMUNITY)
Admission: EM | Admit: 2013-06-10 | Discharge: 2013-06-10 | Disposition: A | Discharge status: Home / Self Care | Payer: Managed Care, Other (non HMO) | Attending: Emergency Medicine | Admitting: Emergency Medicine

## 2013-06-10 DIAGNOSIS — Z8659 Personal history of other mental and behavioral disorders: Secondary | ICD-10-CM | POA: Insufficient documentation

## 2013-06-10 DIAGNOSIS — Z9861 Coronary angioplasty status: Secondary | ICD-10-CM | POA: Insufficient documentation

## 2013-06-10 DIAGNOSIS — Z8669 Personal history of other diseases of the nervous system and sense organs: Secondary | ICD-10-CM | POA: Insufficient documentation

## 2013-06-10 DIAGNOSIS — K219 Gastro-esophageal reflux disease without esophagitis: Secondary | ICD-10-CM | POA: Insufficient documentation

## 2013-06-10 DIAGNOSIS — I10 Essential (primary) hypertension: Secondary | ICD-10-CM | POA: Insufficient documentation

## 2013-06-10 DIAGNOSIS — I2510 Atherosclerotic heart disease of native coronary artery without angina pectoris: Secondary | ICD-10-CM | POA: Insufficient documentation

## 2013-06-10 DIAGNOSIS — F17200 Nicotine dependence, unspecified, uncomplicated: Secondary | ICD-10-CM | POA: Insufficient documentation

## 2013-06-10 DIAGNOSIS — Z7982 Long term (current) use of aspirin: Secondary | ICD-10-CM | POA: Insufficient documentation

## 2013-06-10 DIAGNOSIS — R002 Palpitations: Secondary | ICD-10-CM | POA: Insufficient documentation

## 2013-06-10 DIAGNOSIS — Z79899 Other long term (current) drug therapy: Secondary | ICD-10-CM | POA: Insufficient documentation

## 2013-06-10 LAB — BASIC METABOLIC PANEL
BUN: 8 mg/dL (ref 6–23)
CO2: 26 mEq/L (ref 19–32)
Calcium: 9.8 mg/dL (ref 8.4–10.5)
Chloride: 102 mEq/L (ref 96–112)
Creatinine, Ser: 0.68 mg/dL (ref 0.50–1.35)
GFR calc Af Amer: >90 mL/min (ref >90)
GFR calc non Af Amer: >90 mL/min (ref >90)
Glucose, Bld: 92 mg/dL (ref 70–99)
Potassium: 3.9 mEq/L (ref 3.7–5.3)
Sodium: 139 mEq/L (ref 137–147)

## 2013-06-10 LAB — CBC WITH DIFFERENTIAL/PLATELET
Basophils Absolute: 0 10*3/uL (ref 0.0–0.1)
Basophils Relative: 0 % (ref 0–1)
Eosinophils Absolute: 0.2 10*3/uL (ref 0.0–0.7)
Eosinophils Relative: 3 % (ref 0–5)
HCT: 41.1 % (ref 39.0–52.0)
Hemoglobin: 14.1 g/dL (ref 13.0–17.0)
Lymphocytes Relative: 47 % — ABNORMAL HIGH (ref 12–46)
Lymphs Abs: 3.7 10*3/uL (ref 0.7–4.0)
MCH: 34.5 pg — ABNORMAL HIGH (ref 26.0–34.0)
MCHC: 34.3 g/dL (ref 30.0–36.0)
MCV: 100.5 fL — ABNORMAL HIGH (ref 78.0–100.0)
Monocytes Absolute: 0.6 10*3/uL (ref 0.1–1.0)
Monocytes Relative: 8 % (ref 3–12)
Neutro Abs: 3.3 10*3/uL (ref 1.7–7.7)
Neutrophils Relative %: 42 % — ABNORMAL LOW (ref 43–77)
Platelets: 231 10*3/uL (ref 150–400)
RBC: 4.09 MIL/uL — ABNORMAL LOW (ref 4.22–5.81)
RDW: 13.3 % (ref 11.5–15.5)
WBC: 8 10*3/uL (ref 4.0–10.5)

## 2013-06-10 LAB — TROPONIN I: Troponin I: <0.3 ng/mL (ref <0.30)

## 2013-06-10 NOTE — Discharge Instructions (Signed)
Palpitations   A palpitation is the feeling that your heartbeat is irregular or is faster than normal. It may feel like your heart is fluttering or skipping a beat. Palpitations are usually not a serious problem. However, in some cases, you may need further medical evaluation.  CAUSES   Palpitations can be caused by:   Smoking.   Caffeine or other stimulants, such as diet pills or energy drinks.   Alcohol.   Stress and anxiety.   Strenuous physical activity.   Fatigue.   Certain medicines.   Heart disease, especially if you have a history of arrhythmias. This includes atrial fibrillation, atrial flutter, or supraventricular tachycardia.   An improperly working pacemaker or defibrillator.  DIAGNOSIS   To find the cause of your palpitations, your caregiver will take your history and perform a physical exam. Tests may also be done, including:   Electrocardiography (ECG). This test records the heart's electrical activity.   Cardiac monitoring. This allows your caregiver to monitor your heart rate and rhythm in real time.   Holter monitor. This is a portable device that records your heartbeat and can help diagnose heart arrhythmias. It allows your caregiver to track your heart activity for several days, if needed.   Stress tests by exercise or by giving medicine that makes the heart beat faster.  TREATMENT   Treatment of palpitations depends on the cause of your symptoms and can vary greatly. Most cases of palpitations do not require any treatment other than time, relaxation, and monitoring your symptoms. Other causes, such as atrial fibrillation, atrial flutter, or supraventricular tachycardia, usually require further treatment.  HOME CARE INSTRUCTIONS    Avoid:   Caffeinated coffee, tea, soft drinks, diet pills, and energy drinks.   Chocolate.   Alcohol.   Stop smoking if you smoke.   Reduce your stress and anxiety. Things that can help you relax include:   A method that measures bodily functions so  you can learn to control them (biofeedback).   Yoga.   Meditation.   Physical activity such as swimming, jogging, or walking.   Get plenty of rest and sleep.  SEEK MEDICAL CARE IF:    You continue to have a fast or irregular heartbeat beyond 24 hours.   Your palpitations occur more often.  SEEK IMMEDIATE MEDICAL CARE IF:   You develop chest pain or shortness of breath.   You have a severe headache.   You feel dizzy, or you faint.  MAKE SURE YOU:   Understand these instructions.   Will watch your condition.   Will get help right away if you are not doing well or get worse.  Document Released: 03/10/2000 Document Revised: 07/08/2012 Document Reviewed: 05/12/2011  [employer] Patient Information 2014 [employer]

## 2013-06-10 NOTE — Progress Notes (Signed)
Patient walked into office today with c/o "feels like heart is turning in his chest". No dizziness, or sob. Nurse advised patient to go to ED for evaluation at [facility]. Patient verbalized understanding of plan.

## 2013-06-10 NOTE — ED Provider Notes (Signed)
CSN: [id number]     Arrival date & time 06/10/13  1757 History   First MD Initiated Contact with Patient 06/10/13 1810     Chief Complaint  Patient presents with  . Chest Pain     (Consider location/radiation/quality/duration/timing/severity/associated sxs/prior Treatment) Patient is a 48 y.o. male presenting with chest pain. The history is provided by the patient.  Chest Pain  He complains of an intermittent sensation of his heart "twisting", that is felt as a "uncomfortable" feeling, but not painful. This has occurred episodically, today, and last about 5 seconds each time. He has not had any associated near-syncope or syncope. He denies shortness of breath, cough, headache, neck pain, or back pain. He has similar sensation shortly after his myocardial infarction, last year;  then his Dr. treat him for palpitations. At that time, his metoprolol was increased, and this caused cessation of the symptom. He has not had any symptoms like this in the interim. He takes his medications regularly. He continues to smoke. He has a "high stress" job. There are no other known modifying factors.  Past Medical History  Diagnosis Date  . Coronary artery disease     a. 12/2012 Inf STEMI/Cath/PCI: LM nl, LAD nl, D1 50ost, D2 min irregs, D3 small, LCX 80-62m, OM1/2/3 min irregs, RI 20p, RCA 50p/100d (3.5x18 Xience DEs),PDA/PL nl, EF 76% - complicated by VF/CGS/IABP/VDRF  . Hypertension   . Ischemic cardiomyopathy     a. 12/2012 Echo: EF 45-50%, basal inf, inflat, mid inf HK, mildly reduced RV fxn.  . Tobacco abuse   . Marijuana abuse   . Obstructive sleep apnea   . GERD (gastroesophageal reflux disease)   . Hiatal hernia    Past Surgical History  Procedure Laterality Date  . Dental surgery    . Nasal septum surgery    . Coronary angioplasty  01/13/2013    STENT TO RCA BY DR COOPER   History reviewed. No pertinent family history. History  Substance Use Topics  . Smoking status: Current Every Day  Smoker -- 0.50 packs/day for 31 years    Types: Cigarettes    Start date: 11/21/1981  . Smokeless tobacco: Never Used     Comment: stopped on 01/13/13 but restarted after going home  . Alcohol Use: Yes     Comment: SOICAL     Review of Systems  Cardiovascular: Positive for chest pain.  All other systems reviewed and are negative.      Allergies  Review of patient's allergies indicates no known allergies.  Home Medications   Current Outpatient Rx  Name  Route  Sig  Dispense  Refill  . acetaminophen (TYLENOL) 500 MG tablet   Oral   Take 500 mg by mouth daily as needed for mild pain, moderate pain or headache.          Marland Kitchen aspirin 81 MG tablet   Oral   Take 81 mg by mouth every morning.          Marland Kitchen atorvastatin (LIPITOR) 80 MG tablet   Oral   Take 1 tablet (80 mg total) by mouth daily at 6 PM.   30 tablet   11   . doxycycline (ORACEA) 40 MG capsule   Oral   Take 40 mg by mouth at bedtime.          Marland Kitchen lisinopril (PRINIVIL,ZESTRIL) 10 MG tablet   Oral   Take 1 tablet (10 mg total) by mouth daily.   30 tablet   6   .  metoprolol (LOPRESSOR) 100 MG tablet   Oral   Take 1 tablet (100 mg total) by mouth 2 (two) times daily.   60 tablet   6     Dose increased 02/14/2013   . metroNIDAZOLE (METROCREAM) 0.75 % cream   Topical   Apply 1 application topically at bedtime.          . pantoprazole (PROTONIX) 40 MG tablet   Oral   Take 1 tablet (40 mg total) by mouth 2 (two) times daily.   60 tablet   11   . Ticagrelor (BRILINTA) 90 MG TABS tablet   Oral   Take 1 tablet (90 mg total) by mouth 2 (two) times daily.   60 tablet   11   . nicotine (NICOTINE STEP 2) 14 mg/24hr patch   Transdermal   Place 1 patch (14 mg total) onto the skin as directed.   28 patch   0   . nitroGLYCERIN (NITROSTAT) 0.4 MG SL tablet   Sublingual   Place 1 tablet (0.4 mg total) under the tongue every 5 (five) minutes as needed for chest pain.   25 tablet   3    BP 143/104   Pulse 73  Temp(Src) 97.7 F (36.5 C) (Oral)  Resp 19  Ht 6' (1.829 m)  Wt 175 lb (79.379 kg)  BMI 23.73 kg/m2  SpO2 96% Physical Exam  Nursing note and vitals reviewed. Constitutional: He is oriented to person, place, and time. He appears well-developed and well-nourished.  HENT:  Head: Normocephalic and atraumatic.  Right Ear: External ear normal.  Left Ear: External ear normal.  Eyes: Conjunctivae and EOM are normal. Pupils are equal, round, and reactive to light.  Neck: Normal range of motion and phonation normal. Neck supple.  Cardiovascular: Normal rate, regular rhythm, normal heart sounds and intact distal pulses.   Pulmonary/Chest: Effort normal and breath sounds normal. He exhibits no bony tenderness.  Abdominal: Soft. Normal appearance. There is no tenderness.  Musculoskeletal: Normal range of motion.  Neurological: He is alert and oriented to person, place, and time. No cranial nerve deficit or sensory deficit. He exhibits normal muscle tone. Coordination normal.  Skin: Skin is warm, dry and intact.  Psychiatric: His behavior is normal. Judgment and thought content normal.  Anxious    ED Course  Procedures (including critical care time)  Medications - No data to display  Patient Vitals for the past 24 hrs:  BP Temp Temp src Pulse Resp SpO2 Height Weight  06/10/13 2000 143/104 mmHg - - 73 19 96 % - -  06/10/13 1916 147/105 mmHg 97.7 F (36.5 C) Oral 74 18 97 % - -  06/10/13 1915 147/105 mmHg - - 72 21 97 % - -  06/10/13 1900 168/92 mmHg - - 72 18 97 % - -  06/10/13 1823 155/104 mmHg 97.5 F (36.4 C) Oral 78 22 98 % 6' (1.829 m) 175 lb (79.379 kg)    8:30 PM Reevaluation with update and discussion. After initial assessment and treatment, an updated evaluation reveals no further complaints. Cardiac monitor has continued to show occasional unifocal PVCs, that the patient seems to be aware of, as a sensation of palpitation. There were no couples or runs. Findings  discussed with pt, all questions answered.  [place] Review Labs Reviewed  CBC WITH DIFFERENTIAL - Abnormal; Notable for the following:    RBC 4.09 (*)    MCV 100.5 (*)    MCH 34.5 (*)  Neutrophils Relative % 42 (*)    Lymphocytes Relative 47 (*)    All other components within normal limits  BASIC METABOLIC PANEL  TROPONIN I   Imaging Review Dg Chest 2 View  06/10/2013   CLINICAL DATA:  Chest discomfort  EXAM: CHEST  2 VIEW  COMPARISON:  DG CHEST 1V PORT dated 01/14/2013  FINDINGS: The heart size and mediastinal contours are within normal limits. Both lungs are clear. The visualized skeletal structures are unremarkable.  IMPRESSION: No active cardiopulmonary disease.   Electronically Signed   By: Kathreen Devoid   On: 06/10/2013 19:05      Date: today, 1802  Rate: 77  Rhythm: normal sinus rhythm and premature ventricular contractions (PVC)  QRS Axis: normal  PR and QT Intervals: normal  ST/T Wave abnormalities: nonspecific T wave changes  PR and QRS Conduction Disutrbances:none  Narrative Interpretation:   Old EKG Reviewed: unchanged- 01/24/13   MDM   Final diagnoses:  Palpitations    Palpitations, related to PVCs, apparently new onset, clinically. No presyncope/syncope. Doubt ACS, metabolic instability or impending vascular collapse.  Nursing Notes Reviewed/ Care Coordinated Applicable Imaging Reviewed Interpretation of Laboratory Data incorporated into ED treatment  The patient appears reasonably screened and/or stabilized for discharge and I doubt any other medical condition or other [facility] requiring further screening, evaluation, or treatment in the ED at this time prior to discharge.  Plan: Home Medications- usual; Home Treatments- rest; return here if the recommended treatment, does not improve the symptoms; Recommended follow up- Cardiology f/u asap, non-urgent     Richarda Blade, MD 06/11/13 1114

## 2013-06-10 NOTE — ED Notes (Signed)
Feel like his heart is turning funny.  States it feels more like its not turning right.

## 2013-06-19 ENCOUNTER — Ambulatory Visit (INDEPENDENT_AMBULATORY_CARE_PROVIDER_SITE_OTHER): Payer: Managed Care, Other (non HMO) | Admitting: Cardiology

## 2013-06-19 ENCOUNTER — Encounter: Payer: Self-pay | Admitting: Cardiology

## 2013-06-19 VITALS — BP 155/106 | HR 79 | Ht 72.0 in | Wt 179.0 lb

## 2013-06-19 DIAGNOSIS — I10 Essential (primary) hypertension: Secondary | ICD-10-CM

## 2013-06-19 DIAGNOSIS — I2510 Atherosclerotic heart disease of native coronary artery without angina pectoris: Secondary | ICD-10-CM

## 2013-06-19 DIAGNOSIS — E785 Hyperlipidemia, unspecified: Secondary | ICD-10-CM

## 2013-06-19 DIAGNOSIS — Z720 Tobacco use: Secondary | ICD-10-CM

## 2013-06-19 DIAGNOSIS — F17200 Nicotine dependence, unspecified, uncomplicated: Secondary | ICD-10-CM

## 2013-06-19 DIAGNOSIS — R002 Palpitations: Secondary | ICD-10-CM

## 2013-06-19 MED ORDER — METOPROLOL TARTRATE 100 MG PO TABS: 150.0000 mg | ORAL_TABLET | Freq: Two times a day (BID) | ORAL | Status: DC

## 2013-06-19 NOTE — Patient Instructions (Signed)
   Increase Lopressor to 150mg  twice a day  (1 1/2 tabs of the 100mg  tablet) - new sent to pharm Continue all other current medications. Follow up in  3-4 weeks

## 2013-06-19 NOTE — Progress Notes (Signed)
Clinical Summary Timothy Walker is a 48 y.o.male seen today for follow up of the following medical problems.   1. CAD  - admit to [facility] 01/13/13 to 01/17/13 with STEMI, DES to RCA.  - patient presented in cardiogenic shock, suffered VT/VF arrest requiring multiple defibrillations and IV amiodarone. Following PCI required balloon pump and vasopressors and temporary transvenous pacing.  - Echo LVEF 45-50%, inferior hypokinesis, normal diastolic function  - compliant with meds - denies any recent chest pain   2. HTN  - does not check at home  - compliant with meds   3. Tobacco abuse  - up to 1/2 ppd  - Using electronic cigarettes without much help  - did not fill Rx for patches from last visit yet  4. Hyperlipidemia  - compliant with statin   5. Palpitations-  - reports some occasional feeling of heart thumping.  - wore a monitor recently which showed symptomatic NSR with occasional PVCs, no significant arrhythmias. We increased his metorpol to 100 mg bid with improved symptoms   - recent ER visit with palpitations 06/10/13, EKG NSR with PVC. Trop negative,  K 3.9. No chest pain.      Past Medical History  Diagnosis Date  . Coronary artery disease     a. 12/2012 Inf STEMI/Cath/PCI: LM nl, LAD nl, D1 50ost, D2 min irregs, D3 small, LCX 80-65m, OM1/2/3 min irregs, RI 20p, RCA 50p/100d (3.5x18 Xience DEs),PDA/PL nl, EF 13% - complicated by VF/CGS/IABP/VDRF  . Hypertension   . Ischemic cardiomyopathy     a. 12/2012 Echo: EF 45-50%, basal inf, inflat, mid inf HK, mildly reduced RV fxn.  . Tobacco abuse   . Marijuana abuse   . Obstructive sleep apnea   . GERD (gastroesophageal reflux disease)   . Hiatal hernia      No Known Allergies   Current Outpatient Prescriptions  Medication Sig Dispense Refill  . acetaminophen (TYLENOL) 500 MG tablet Take 500 mg by mouth daily as needed for mild pain, moderate pain or headache.       Marland Kitchen aspirin 81 MG tablet Take 81 mg by mouth  every morning.       Marland Kitchen atorvastatin (LIPITOR) 80 MG tablet Take 1 tablet (80 mg total) by mouth daily at 6 PM.  30 tablet  11  . doxycycline (ORACEA) 40 MG capsule Take 40 mg by mouth at bedtime.       Marland Kitchen lisinopril (PRINIVIL,ZESTRIL) 10 MG tablet Take 1 tablet (10 mg total) by mouth daily.  30 tablet  6  . metoprolol (LOPRESSOR) 100 MG tablet Take 1 tablet (100 mg total) by mouth 2 (two) times daily.  60 tablet  6  . metroNIDAZOLE (METROCREAM) 0.75 % cream Apply 1 application topically at bedtime.       . nicotine (NICOTINE STEP 2) 14 mg/24hr patch Place 1 patch (14 mg total) onto the skin as directed.  28 patch  0  . nitroGLYCERIN (NITROSTAT) 0.4 MG SL tablet Place 1 tablet (0.4 mg total) under the tongue every 5 (five) minutes as needed for chest pain.  25 tablet  3  . pantoprazole (PROTONIX) 40 MG tablet Take 1 tablet (40 mg total) by mouth 2 (two) times daily.  60 tablet  11  . Ticagrelor (BRILINTA) 90 MG TABS tablet Take 1 tablet (90 mg total) by mouth 2 (two) times daily.  60 tablet  11   No current facility-administered medications for this visit.     Past  Surgical History  Procedure Laterality Date  . Dental surgery    . Nasal septum surgery    . Coronary angioplasty  01/13/2013    STENT TO RCA BY DR COOPER     No Known Allergies    No family history on file.   Social History Timothy Walker reports that he has been smoking Cigarettes.  He started smoking about 31 years ago. He has a 15.5 pack-year smoking history. He has never used smokeless tobacco. Timothy Walker reports that he drinks alcohol.   Review of Systems CONSTITUTIONAL: No weight loss, fever, chills, weakness or fatigue.  HEENT: Eyes: No visual loss, blurred vision, double vision or yellow sclerae.No hearing loss, sneezing, congestion, runny nose or sore throat.  SKIN: No rash or itching.  CARDIOVASCULAR: per HPI RESPIRATORY: No shortness of breath, cough or sputum.  GASTROINTESTINAL: No anorexia, nausea,  vomiting or diarrhea. No abdominal pain or blood.  GENITOURINARY: No burning on urination, no polyuria NEUROLOGICAL: No headache, dizziness, syncope, paralysis, ataxia, numbness or tingling in the extremities. No change in bowel or bladder control.  MUSCULOSKELETAL: No muscle, back pain, joint pain or stiffness.  LYMPHATICS: No enlarged nodes. No history of splenectomy.  PSYCHIATRIC: No history of depression or anxiety.  ENDOCRINOLOGIC: No reports of sweating, cold or heat intolerance. No polyuria or polydipsia.  Marland Kitchen   Physical Examination p 79 bp 155/106 Wt 179 lbs BMI 24 Gen: resting comfortably, no acute distress HEENT: no scleral icterus, pupils equal round and reactive, no palptable cervical adenopathy,  CV: RRR, no m/r/g, no JVD, no carotid bruits Resp: Clear to auscultation bilaterally GI: abdomen is soft, non-tender, non-distended, normal bowel sounds, no hepatosplenomegaly MSK: extremities are warm, no edema.  Skin: warm, no rash Neuro:  no focal deficits Psych: appropriate affect   Diagnostic Studies Hemodynamics:  AO: 119/82 mmHg  LV: 119/15 mmHg  LVEDP: 28 mmHg  Coronary angiography:  Coronary dominance: right  Left Main: normal  Left Anterior Descending (LAD): normal in size with minor irregularities throughout its course but no evidence of obstructive disease.  1st diagonal (D1): small in size with 50% ostial stenosis.  2nd diagonal (D2): small in size with minor irregularities.  3rd diagonal (D3): very small in size.  Circumflex (LCx): normal in size and nondominant. There is an 80-90% mid stenosis at the origin of OM 2.  1st obtuse marginal: small in size with minor irregularities.  2nd obtuse marginal: normal in size with no significant disease.  3rd obtuse marginal: medium in size with no significant disease.  Ramus Intermedius: normal in size with 20% proximal stenosis.  Right Coronary Artery: large in size and dominant. There is 50% proximal stenosis. The  vessel is occluded distally with large thrombus burden.  Posterior descending artery: normal  Posterior AV segment: normal  Posterolateral branchs: normal Left ventriculography: Left ventricular systolic function is normal , LVEF is estimated at 55 %, there is no significant mitral regurgitation . mild inferior wall hypokinesis  PCI Data:  Vessel - distal RCA/Segment - 3  Percent Stenosis (pre) 100%  TIMI-flow 0  Stent 3.5 x 18 mm Xience expedition drug-eluting stent  Percent Stenosis (post) 0%  TIMI-flow (post) 3  Final Conclusions:  1. Refractory ventricular fibrillation and cardiogenic shock due to inferior ST elevation myocardial infarction. Occluded distal right coronary artery. There is significant bifurcation disease in the mid left circumflex supplying an overall small to medium sized territory.  2. Profound hypotension on presentation with subsequent improvement with revascularization and intra-aortic  balloon pump placement.  3. Normal LV systolic function with moderately elevated left ventricular end-diastolic pressure.  4. Successful PCI and drug-eluting stent placement to the right coronary artery.   01/13/13 Echo:  LVEF 45-50%, inferior hypokinesis, normal diastolic function   09/24/14 14 day event monitor: no significant arrhythmias, palpitations correlate with sinus rhythm, occasional PVCs.        Assessment and Plan  1. CAD  - recent STEMI complicated by cardiogenic shock and VT/VF arrest, s/p PCI to RCA  - no current symptoms  - continue current meds, DAPT at least until 12/2013   2. HTN  - elevated today, follow trend as we increase his metoprolol  3. Hyperlipidemia  - continue current statin   4. Tobacco  - encouraged cessation, encouraged to fill Rx for nicotine patches  - last visit ordered PFTs, he has not completed yet  5. Palpitations  - event monitor shows symptoms correlate with normal sinus rhythm, occasional PVCs. No significant arrhythmias,  which was the concern given his recent MI complicated with VT/VF arrest  - lopressor had improved symptoms, recently in ER with worsening palps - will increase lopressor to 150mg  bid    Follow up 3-4 weeks     Arnoldo Lenis, M.D., F.A.C.C.

## 2013-07-07 ENCOUNTER — Encounter (HOSPITAL_COMMUNITY): Payer: Managed Care, Other (non HMO)

## 2013-07-31 ENCOUNTER — Encounter: Payer: Self-pay | Admitting: Cardiology

## 2013-07-31 ENCOUNTER — Ambulatory Visit (INDEPENDENT_AMBULATORY_CARE_PROVIDER_SITE_OTHER): Payer: Managed Care, Other (non HMO) | Admitting: Cardiology

## 2013-07-31 VITALS — BP 167/109 | HR 60 | Ht 72.0 in | Wt 176.0 lb

## 2013-07-31 DIAGNOSIS — I10 Essential (primary) hypertension: Secondary | ICD-10-CM

## 2013-07-31 DIAGNOSIS — E785 Hyperlipidemia, unspecified: Secondary | ICD-10-CM

## 2013-07-31 DIAGNOSIS — I2510 Atherosclerotic heart disease of native coronary artery without angina pectoris: Secondary | ICD-10-CM

## 2013-07-31 DIAGNOSIS — R002 Palpitations: Secondary | ICD-10-CM

## 2013-07-31 DIAGNOSIS — F17200 Nicotine dependence, unspecified, uncomplicated: Secondary | ICD-10-CM

## 2013-07-31 DIAGNOSIS — Z720 Tobacco use: Secondary | ICD-10-CM

## 2013-07-31 MED ORDER — METOPROLOL TARTRATE 50 MG PO TABS: 50.0000 mg | ORAL_TABLET | Freq: Two times a day (BID) | ORAL | Status: DC

## 2013-07-31 MED ORDER — METOPROLOL TARTRATE 100 MG PO TABS: 100.0000 mg | ORAL_TABLET | Freq: Two times a day (BID) | ORAL | Status: DC

## 2013-07-31 NOTE — Progress Notes (Signed)
Clinical Summary Timothy Walker is a 48 y.o.male seen today for follow up of the following medical problems.   1. CAD  - admit to [facility] 01/13/13 to 01/17/13 with STEMI, DES to RCA.  - patient presented in cardiogenic shock, suffered VT/VF arrest requiring multiple defibrillations and IV amiodarone. Following PCI required balloon pump and vasopressors and temporary transvenous pacing.  - Echo LVEF 45-50%, inferior hypokinesis, normal diastolic function  - compliant with meds - denies any recent chest pain. Denies any SOB or DOE.   2. HTN  - does not check at home  - compliant with meds   3. Tobacco abuse  - up to 1 ppd - Using electronic cigarettes without much help  - tried patch last visit without much benefit, made him nauseous b/c continued smoking.   4. Hyperlipidemia  - compliant with statin  - last lipid panel 12/2012 TC 148 TG 178 HDL 31 LDL 81   5. Palpitations-  - wore a monitor recently which showed symptomatic NSR with occasional PVCs, no significant arrhythmias.  - recent ER visit with palpitations 06/10/13, EKG NSR with PVC. Trop negative, K 3.9. No chest pain. - last visit increased lopressor to 150mg  bid. Symptoms resolved with this change.   6. Depression - followed in mental health, recently started celexa.   Past Medical History  Diagnosis Date  . Coronary artery disease     a. 12/2012 Inf STEMI/Cath/PCI: LM nl, LAD nl, D1 50ost, D2 min irregs, D3 small, LCX 80-63m, OM1/2/3 min irregs, RI 20p, RCA 50p/100d (3.5x18 Xience DEs),PDA/PL nl, EF 12% - complicated by VF/CGS/IABP/VDRF  . Hypertension   . Ischemic cardiomyopathy     a. 12/2012 Echo: EF 45-50%, basal inf, inflat, mid inf HK, mildly reduced RV fxn.  . Tobacco abuse   . Marijuana abuse   . Obstructive sleep apnea   . GERD (gastroesophageal reflux disease)   . Hiatal hernia      No Known Allergies   Current Outpatient Prescriptions  Medication Sig Dispense Refill  . acetaminophen  (TYLENOL) 500 MG tablet Take 500 mg by mouth daily as needed for mild pain, moderate pain or headache.       Marland Kitchen aspirin 81 MG tablet Take 81 mg by mouth every morning.       Marland Kitchen atorvastatin (LIPITOR) 80 MG tablet Take 1 tablet (80 mg total) by mouth daily at 6 PM.  30 tablet  11  . doxycycline (ORACEA) 40 MG capsule Take 40 mg by mouth at bedtime.       Marland Kitchen lisinopril (PRINIVIL,ZESTRIL) 10 MG tablet Take 1 tablet (10 mg total) by mouth daily.  30 tablet  6  . metoprolol (LOPRESSOR) 100 MG tablet Take 1.5 tablets (150 mg total) by mouth 2 (two) times daily.  90 tablet  6  . metroNIDAZOLE (METROCREAM) 0.75 % cream Apply 1 application topically at bedtime.       . nicotine (NICOTINE STEP 2) 14 mg/24hr patch Place 1 patch (14 mg total) onto the skin as directed.  28 patch  0  . nitroGLYCERIN (NITROSTAT) 0.4 MG SL tablet Place 1 tablet (0.4 mg total) under the tongue every 5 (five) minutes as needed for chest pain.  25 tablet  3  . pantoprazole (PROTONIX) 40 MG tablet Take 1 tablet (40 mg total) by mouth 2 (two) times daily.  60 tablet  11  . Ticagrelor (BRILINTA) 90 MG TABS tablet Take 1 tablet (90 mg total) by mouth 2 (  two) times daily.  60 tablet  11   No current facility-administered medications for this visit.     Past Surgical History  Procedure Laterality Date  . Dental surgery    . Nasal septum surgery    . Coronary angioplasty  01/13/2013    STENT TO RCA BY DR COOPER     No Known Allergies    No family history on file.   Social History Timothy Walker reports that he has been smoking Cigarettes.  He started smoking about 31 years ago. He has a 15.5 pack-year smoking history. He has never used smokeless tobacco. Timothy Walker reports that he drinks alcohol.   Review of Systems CONSTITUTIONAL: No weight loss, fever, chills, weakness or fatigue.  HEENT: Eyes: No visual loss, blurred vision, double vision or yellow sclerae.No hearing loss, sneezing, congestion, runny nose or sore throat.    SKIN: No rash or itching.  CARDIOVASCULAR: per HPI RESPIRATORY: No shortness of breath, cough or sputum.  GASTROINTESTINAL: No anorexia, nausea, vomiting or diarrhea. No abdominal pain or blood.  GENITOURINARY: No burning on urination, no polyuria NEUROLOGICAL: No headache, dizziness, syncope, paralysis, ataxia, numbness or tingling in the extremities. No change in bowel or bladder control.  MUSCULOSKELETAL: No muscle, back pain, joint pain or stiffness.  LYMPHATICS: No enlarged nodes. No history of splenectomy.  PSYCHIATRIC: +depression ENDOCRINOLOGIC: No reports of sweating, cold or heat intolerance. No polyuria or polydipsia.  Marland Kitchen   Physical Examination p 60 bp 150/90 Wt 176 lbs BMI 24 Gen: resting comfortably, no acute distress HEENT: no scleral icterus, pupils equal round and reactive, no palptable cervical adenopathy,  CV: RRR, no m/r/g, no JVD, no carotid bruits Resp: Clear to auscultation bilaterally GI: abdomen is soft, non-tender, non-distended, normal bowel sounds, no hepatosplenomegaly MSK: extremities are warm, no edema.  Skin: warm, no rash Neuro:  no focal deficits Psych: appropriate affect   Diagnostic Studies Cath 12/2012 Hemodynamics:  AO: 119/82 mmHg  LV: 119/15 mmHg  LVEDP: 28 mmHg  Coronary angiography:  Coronary dominance: right  Left Main: normal  Left Anterior Descending (LAD): normal in size with minor irregularities throughout its course but no evidence of obstructive disease.  1st diagonal (D1): small in size with 50% ostial stenosis.  2nd diagonal (D2): small in size with minor irregularities.  3rd diagonal (D3): very small in size.  Circumflex (LCx): normal in size and nondominant. There is an 80-90% mid stenosis at the origin of OM 2.  1st obtuse marginal: small in size with minor irregularities.  2nd obtuse marginal: normal in size with no significant disease.  3rd obtuse marginal: medium in size with no significant disease.  Ramus  Intermedius: normal in size with 20% proximal stenosis.  Right Coronary Artery: large in size and dominant. There is 50% proximal stenosis. The vessel is occluded distally with large thrombus burden.  Posterior descending artery: normal  Posterior AV segment: normal  Posterolateral branchs: normal Left ventriculography: Left ventricular systolic function is normal , LVEF is estimated at 55 %, there is no significant mitral regurgitation . mild inferior wall hypokinesis  PCI Data:  Vessel - distal RCA/Segment - 3  Percent Stenosis (pre) 100%  TIMI-flow 0  Stent 3.5 x 18 mm Xience expedition drug-eluting stent  Percent Stenosis (post) 0%  TIMI-flow (post) 3  Final Conclusions:  1. Refractory ventricular fibrillation and cardiogenic shock due to inferior ST elevation myocardial infarction. Occluded distal right coronary artery. There is significant bifurcation disease in the mid left circumflex supplying an  overall small to medium sized territory.  2. Profound hypotension on presentation with subsequent improvement with revascularization and intra-aortic balloon pump placement.  3. Normal LV systolic function with moderately elevated left ventricular end-diastolic pressure.  4. Successful PCI and drug-eluting stent placement to the right coronary artery.   01/13/13 Echo:  LVEF 45-50%, inferior hypokinesis, normal diastolic function   35/46/56 14 day event monitor: no significant arrhythmias, palpitations correlate with sinus rhythm, occasional PVCs.      Assessment and Plan  1. CAD  - 81/2751 STEMI complicated by cardiogenic shock and VT/VF arrest, s/p PCI to RCA  - no current symptoms  - continue current meds, DAPT at least until 12/2013   2. HTN  - elevated today, he just took his bp meds prior to our visit - will return for bp check in 2 weeks, may need further titration, would likely increase his lisnopril  3. Hyperlipidemia  - continue current statin, likely repeat lipid panel  at next visit  4. Tobacco  - encouraged cessation - last visit ordered PFTs, he has not completed yet   5. Palpitations  - event monitor shows symptoms correlate with normal sinus rhythm, occasional PVCs. No significant arrhythmias, which was the concern given his prior MI complicated with VT/VF arrest  - symptoms resolved with increased lopressor   F/u 4 months          Arnoldo Lenis, M.D., F.A.C.C.

## 2013-07-31 NOTE — Patient Instructions (Signed)
   Continue Metoprolol tart 100mg  twice a day    Continue Metoprolol tart 50mg  twice a day    Individual scripts sent to pharm Continue all other medications.   2 weeks for nurse BP check Your physician wants you to follow up in:  4 months.  You will receive a reminder letter in the mail one-two months in advance.  If you don't receive a letter, please call our office to schedule the follow up appointment

## 2013-08-04 ENCOUNTER — Other Ambulatory Visit: Payer: Self-pay | Admitting: Cardiology

## 2013-08-15 ENCOUNTER — Ambulatory Visit (INDEPENDENT_AMBULATORY_CARE_PROVIDER_SITE_OTHER): Payer: Managed Care, Other (non HMO) | Admitting: *Deleted

## 2013-08-15 VITALS — BP 159/105 | HR 62 | Ht 72.0 in | Wt 173.0 lb

## 2013-08-15 DIAGNOSIS — I10 Essential (primary) hypertension: Secondary | ICD-10-CM

## 2013-08-15 NOTE — Progress Notes (Signed)
Patient presents to office for nurse BP check that was requested at last office visit. Patient has taken all of his medications. Patient did report missing his morning dose yesterday. Patient denies any side effects from his medications. Patient also denies having any sob, dizziness or chest pain.

## 2013-08-15 NOTE — Patient Instructions (Signed)
Your physician recommends that you continue on your current medications as directed. Please refer to the Current Medication list given to you today. If any changes are made today after your doctor reviews your BP readings, you will be contacted.

## 2013-08-18 NOTE — Progress Notes (Signed)
BP is above goal. Please confirm that he is currently taking lisinopril 10mg  daily, and have him increase to 20mg  daily   Zandra Abts MD

## 2013-08-19 NOTE — Progress Notes (Signed)
LM on vm to call office. All medications brought to office and reviewed with patient during nurse visit.

## 2013-08-20 NOTE — Progress Notes (Signed)
LM on vm to call office.

## 2013-08-22 NOTE — Progress Notes (Signed)
LM to call office

## 2013-08-25 ENCOUNTER — Telehealth: Payer: Self-pay | Admitting: *Deleted

## 2013-08-25 NOTE — Telephone Encounter (Signed)
Timothy Lenis, MD at 08/18/2013 8:41 AM     Status: Signed HNO ID: [id number] HNO DAT: [id number]    BP is above goal. Please confirm that he is currently taking lisinopril 10mg  daily, and have him increase to 20mg  daily  Zandra Abts MD

## 2013-08-25 NOTE — Progress Notes (Signed)
LM to call office

## 2013-08-27 ENCOUNTER — Ambulatory Visit (HOSPITAL_COMMUNITY)
Admission: RE | Admit: 2013-08-27 | Discharge: 2013-08-27 | Disposition: A | Discharge status: Home / Self Care | Payer: Managed Care, Other (non HMO) | Source: Ambulatory Visit | Attending: Pulmonary Disease | Admitting: Pulmonary Disease

## 2013-08-27 ENCOUNTER — Other Ambulatory Visit (HOSPITAL_COMMUNITY): Payer: Self-pay | Admitting: Pulmonary Disease

## 2013-08-27 DIAGNOSIS — M549 Dorsalgia, unspecified: Secondary | ICD-10-CM

## 2013-08-27 DIAGNOSIS — M51379 Other intervertebral disc degeneration, lumbosacral region without mention of lumbar back pain or lower extremity pain: Secondary | ICD-10-CM | POA: Insufficient documentation

## 2013-08-27 DIAGNOSIS — M5030 Other cervical disc degeneration, unspecified cervical region: Secondary | ICD-10-CM | POA: Insufficient documentation

## 2013-08-27 DIAGNOSIS — M5137 Other intervertebral disc degeneration, lumbosacral region: Secondary | ICD-10-CM | POA: Insufficient documentation

## 2013-08-27 DIAGNOSIS — M5146 Schmorl's nodes, lumbar region: Secondary | ICD-10-CM | POA: Insufficient documentation

## 2013-08-27 DIAGNOSIS — M542 Cervicalgia: Secondary | ICD-10-CM

## 2013-08-27 MED ORDER — LISINOPRIL 20 MG PO TABS: 20.0000 mg | ORAL_TABLET | Freq: Every day | ORAL | Status: DC

## 2013-08-27 NOTE — Telephone Encounter (Signed)
Patient confirmed that he is on 10 mg and is aware to take (2) of them daily until they are finished, then; he can start the new rx for lisinopril 20 mg daily.

## 2013-08-30 ENCOUNTER — Other Ambulatory Visit (HOSPITAL_COMMUNITY): Payer: Self-pay | Admitting: Nurse Practitioner

## 2013-09-02 ENCOUNTER — Other Ambulatory Visit (HOSPITAL_COMMUNITY): Payer: Self-pay | Admitting: Pulmonary Disease

## 2013-09-02 DIAGNOSIS — M545 Low back pain, unspecified: Secondary | ICD-10-CM

## 2013-09-08 ENCOUNTER — Ambulatory Visit (HOSPITAL_COMMUNITY): Payer: Managed Care, Other (non HMO)

## 2013-09-10 ENCOUNTER — Ambulatory Visit (HOSPITAL_COMMUNITY)
Admission: RE | Admit: 2013-09-10 | Discharge: 2013-09-10 | Disposition: A | Discharge status: Home / Self Care | Payer: Managed Care, Other (non HMO) | Source: Ambulatory Visit | Attending: Pulmonary Disease | Admitting: Pulmonary Disease

## 2013-09-10 DIAGNOSIS — M545 Low back pain, unspecified: Secondary | ICD-10-CM

## 2013-09-10 DIAGNOSIS — M5126 Other intervertebral disc displacement, lumbar region: Secondary | ICD-10-CM | POA: Insufficient documentation

## 2013-09-10 DIAGNOSIS — M79609 Pain in unspecified limb: Secondary | ICD-10-CM | POA: Insufficient documentation

## 2013-09-10 DIAGNOSIS — M48061 Spinal stenosis, lumbar region without neurogenic claudication: Secondary | ICD-10-CM | POA: Insufficient documentation

## 2013-12-12 ENCOUNTER — Encounter: Payer: Managed Care, Other (non HMO) | Admitting: Cardiology

## 2013-12-12 NOTE — Progress Notes (Signed)
     ERROR NO Show      Arnoldo Lenis, M.D., F.A.C.C.

## 2014-01-02 ENCOUNTER — Ambulatory Visit: Payer: Managed Care, Other (non HMO) | Admitting: Cardiology

## 2014-01-30 ENCOUNTER — Other Ambulatory Visit: Payer: Self-pay | Admitting: Physician Assistant

## 2014-02-13 ENCOUNTER — Ambulatory Visit (INDEPENDENT_AMBULATORY_CARE_PROVIDER_SITE_OTHER): Payer: Managed Care, Other (non HMO) | Admitting: Cardiology

## 2014-02-13 ENCOUNTER — Encounter: Payer: Self-pay | Admitting: Cardiology

## 2014-02-13 ENCOUNTER — Other Ambulatory Visit: Payer: Self-pay | Admitting: Physician Assistant

## 2014-02-13 ENCOUNTER — Telehealth: Payer: Self-pay | Admitting: Cardiology

## 2014-02-13 ENCOUNTER — Encounter: Payer: Managed Care, Other (non HMO) | Admitting: Cardiology

## 2014-02-13 VITALS — BP 150/102 | HR 69 | Ht 72.0 in | Wt 175.0 lb

## 2014-02-13 DIAGNOSIS — Z720 Tobacco use: Secondary | ICD-10-CM

## 2014-02-13 DIAGNOSIS — I2510 Atherosclerotic heart disease of native coronary artery without angina pectoris: Secondary | ICD-10-CM

## 2014-02-13 DIAGNOSIS — I10 Essential (primary) hypertension: Secondary | ICD-10-CM

## 2014-02-13 DIAGNOSIS — R0602 Shortness of breath: Secondary | ICD-10-CM

## 2014-02-13 DIAGNOSIS — E785 Hyperlipidemia, unspecified: Secondary | ICD-10-CM

## 2014-02-13 MED ORDER — LOSARTAN POTASSIUM 50 MG PO TABS: 50.0000 mg | ORAL_TABLET | Freq: Every day | ORAL | Status: DC

## 2014-02-13 NOTE — Telephone Encounter (Signed)
PFT - SOB Scheduled for Dec 11th @ 8:00am at [facility]

## 2014-02-13 NOTE — Progress Notes (Signed)
     ERROR Canceleld

## 2014-02-13 NOTE — Patient Instructions (Signed)
   Stop Lisinopril  Begin Losartan 50mg  daily - new sent to pharm  Stop [place] all other medications.   Your physician has recommended that you have a pulmonary function test. Pulmonary Function Tests are a group of tests that measure how well air moves in and out of your lungs. Office will contact with results via phone or letter.   Nurse visit in 2 weeks for blood pressure check Your physician wants you to follow up in: 6 months.  You will receive a reminder letter in the mail one-two months in advance.  If you don't receive a letter, please call our office to schedule the follow up appointment

## 2014-02-13 NOTE — Progress Notes (Signed)
Clinical Summary Mr. Loveland is a 48 y.o.male seen today for follow up of the following medical problems.   1. CAD  - admit to [facility] 01/13/13 to 01/17/13 with STEMI, DES to RCA.  - patient presented in cardiogenic shock, suffered VT/VF arrest requiring multiple defibrillations and IV amiodarone. Following PCI required balloon pump and vasopressors and temporary transvenous pacing.  - Echo LVEF 45-50%, inferior hypokinesis, normal diastolic function   - since last visit he denies any chest pain. He remains compliant with his medications including DAPT.    2. HTN  - does not check bp at home  - compliant with meds, however he ran out of his lisinopril a few days ago    3. Tobacco abuse  - up to 1 ppd - has recently tried nicotine patches and ecigs without much benefit  4. Hyperlipidemia  - compliant with statin   5. Depression - followed in mental health, he currently is on celexa. Recent added stress in his life as his 77 year old son has recently been diagnosed with lymphoma   Past Medical History  Diagnosis Date  . Coronary artery disease     a. 12/2012 Inf STEMI/Cath/PCI: LM nl, LAD nl, D1 50ost, D2 min irregs, D3 small, LCX 80-54m, OM1/2/3 min irregs, RI 20p, RCA 50p/100d (3.5x18 Xience DEs),PDA/PL nl, EF 82% - complicated by VF/CGS/IABP/VDRF  . Hypertension   . Ischemic cardiomyopathy     a. 12/2012 Echo: EF 45-50%, basal inf, inflat, mid inf HK, mildly reduced RV fxn.  . Tobacco abuse   . Marijuana abuse   . Obstructive sleep apnea   . GERD (gastroesophageal reflux disease)   . Hiatal hernia      No Known Allergies   Current Outpatient Prescriptions  Medication Sig Dispense Refill  . acetaminophen (TYLENOL) 500 MG tablet Take 500 mg by mouth daily as needed for mild pain, moderate pain or headache.     Marland Kitchen aspirin 81 MG tablet Take 81 mg by mouth every morning.     Marland Kitchen atorvastatin (LIPITOR) 80 MG tablet TAKE 1 TABLET EVERY DAY AT 6PM 30 tablet 1   . BRILINTA 90 MG TABS tablet TAKE 1 TABLET BY MOUTH TWICE A DAY 60 tablet 6  . citalopram (CELEXA) 20 MG tablet Take 1 tablet by mouth daily.    Marland Kitchen doxycycline (ORACEA) 40 MG capsule Take 40 mg by mouth at bedtime.     Marland Kitchen lisinopril (PRINIVIL,ZESTRIL) 20 MG tablet Take 1 tablet (20 mg total) by mouth daily. 90 tablet 3  . metoprolol (LOPRESSOR) 100 MG tablet Take 1 tablet (100 mg total) by mouth 2 (two) times daily. 60 tablet 6  . metoprolol (LOPRESSOR) 50 MG tablet Take 1 tablet (50 mg total) by mouth 2 (two) times daily. 60 tablet 6  . metroNIDAZOLE (METROCREAM) 0.75 % cream Apply 1 application topically at bedtime.     . nicotine (NICOTINE STEP 2) 14 mg/24hr patch Place 1 patch (14 mg total) onto the skin as directed. 28 patch 0  . nitroGLYCERIN (NITROSTAT) 0.4 MG SL tablet Place 1 tablet (0.4 mg total) under the tongue every 5 (five) minutes as needed for chest pain. 25 tablet 3  . pantoprazole (PROTONIX) 40 MG tablet TAKE 1 TABLET TWICE A DAY 60 tablet 6   No current facility-administered medications for this visit.     Past Surgical History  Procedure Laterality Date  . Dental surgery    . Nasal septum surgery    .  Coronary angioplasty  01/13/2013    STENT TO RCA BY DR COOPER     No Known Allergies    No family history on file.   Social History Mr. Maiorino reports that he has been smoking Cigarettes.  He started smoking about 32 years ago. He has a 15.5 pack-year smoking history. He has never used smokeless tobacco. Mr. Lomas reports that he drinks alcohol.   Review of Systems CONSTITUTIONAL: No weight loss, fever, chills, weakness or fatigue.  HEENT: Eyes: No visual loss, blurred vision, double vision or yellow sclerae.No hearing loss, sneezing, congestion, runny nose or sore throat.  SKIN: No rash or itching.  CARDIOVASCULAR: per HPI RESPIRATORY: +cough GASTROINTESTINAL: No anorexia, nausea, vomiting or diarrhea. No abdominal pain or blood.  GENITOURINARY: No  burning on urination, no polyuria NEUROLOGICAL: No headache, dizziness, syncope, paralysis, ataxia, numbness or tingling in the extremities. No change in bowel or bladder control.  MUSCULOSKELETAL: No muscle, back pain, joint pain or stiffness.  LYMPHATICS: No enlarged nodes. No history of splenectomy.  PSYCHIATRIC: No history of depression or anxiety.  ENDOCRINOLOGIC: No reports of sweating, cold or heat intolerance. No polyuria or polydipsia.  Marland Kitchen   Physical Examination There were no vitals filed for this visit. There were no vitals filed for this visit.  Gen: resting comfortably, no acute distress HEENT: no scleral icterus, pupils equal round and reactive, no palptable cervical adenopathy,  CV Resp: Clear to auscultation bilaterally GI: abdomen is soft, non-tender, non-distended, normal bowel sounds, no hepatosplenomegaly MSK: extremities are warm, no edema.  Skin: warm, no rash Neuro:  no focal deficits Psych: appropriate affect   Diagnostic Studies Cath 12/2012 Hemodynamics:  AO: 119/82 mmHg  LV: 119/15 mmHg  LVEDP: 28 mmHg  Coronary angiography:  Coronary dominance: right   Left Main: normal   Left Anterior Descending (LAD): normal in size with minor irregularities throughout its course but no evidence of obstructive disease.   1st diagonal (D1): small in size with 50% ostial stenosis.   2nd diagonal (D2): small in size with minor irregularities.   3rd diagonal (D3): very small in size.   Circumflex (LCx): normal in size and nondominant. There is an 80-90% mid stenosis at the origin of OM 2.   1st obtuse marginal: small in size with minor irregularities.   2nd obtuse marginal: normal in size with no significant disease.   3rd obtuse marginal: medium in size with no significant disease.   Ramus Intermedius: normal in size with 20% proximal stenosis.   Right Coronary Artery: large in size and dominant. There is 50% proximal stenosis. The vessel  is occluded distally with large thrombus burden.   Posterior descending artery: normal   Posterior AV segment: normal   Posterolateral branchs: normal Left ventriculography: Left ventricular systolic function is normal , LVEF is estimated at 55 %, there is no significant mitral regurgitation . mild inferior wall hypokinesis  PCI Data:  Vessel - distal RCA/Segment - 3  Percent Stenosis (pre) 100%  TIMI-flow 0  Stent 3.5 x 18 mm Xience expedition drug-eluting stent  Percent Stenosis (post) 0%  TIMI-flow (post) 3  Final Conclusions:  1. Refractory ventricular fibrillation and cardiogenic shock due to inferior ST elevation myocardial infarction. Occluded distal right coronary artery. There is significant bifurcation disease in the mid left circumflex supplying an overall small to medium sized territory.  2. Profound hypotension on presentation with subsequent improvement with revascularization and intra-aortic balloon pump placement.  3. Normal LV systolic function with moderately elevated  left ventricular end-diastolic pressure.  4. Successful PCI and drug-eluting stent placement to the right coronary artery.   01/13/13 Echo:  LVEF 45-50%, inferior hypokinesis, normal diastolic function   05/69/79 14 day event monitor: no significant arrhythmias, palpitations correlate with sinus rhythm, occasional PVCs.      Assessment and Plan   1. CAD  - 48/0165 STEMI complicated by cardiogenic shock and VT/VF arrest, s/p PCI to RCA. LVEF 45-50% - no current symptoms. He has completed over a year of DAPT, we will stop his brillinta  2. HTN  - describes cough, we will try changing his lisinopril to losartan 50mg  daily. BP up today, he will return in 2 weeks for bp check, further med titration as needed based on those results  3. Hyperlipidemia  - continue high dose statin in setting of CAD  4. Tobacco  - encouraged cessation - will reorder PFTs as he describes sob and  cough   F/u 6 months    Arnoldo Lenis, M.D.

## 2014-02-27 ENCOUNTER — Ambulatory Visit (INDEPENDENT_AMBULATORY_CARE_PROVIDER_SITE_OTHER): Payer: Managed Care, Other (non HMO) | Admitting: *Deleted

## 2014-02-27 VITALS — BP 162/102 | HR 64

## 2014-02-27 DIAGNOSIS — I10 Essential (primary) hypertension: Secondary | ICD-10-CM

## 2014-03-02 NOTE — Progress Notes (Signed)
Patient in for nurse visit.  Stated he had just taken his medication not long before visit.  Stated that he is still under a lot of stress.

## 2014-03-03 NOTE — Progress Notes (Signed)
Dr. Bronson Ing - please advise on BP reading.

## 2014-03-05 ENCOUNTER — Encounter (HOSPITAL_COMMUNITY): Payer: Self-pay | Admitting: Cardiovascular Disease

## 2014-03-05 MED ORDER — LOSARTAN POTASSIUM 100 MG PO TABS: 100.0000 mg | ORAL_TABLET | Freq: Every day | ORAL | Status: DC

## 2014-03-05 NOTE — Progress Notes (Signed)
BP is too high, please increase losartan to 100mg  daily and recheck BMET in 2 weeks. Nursing visit for bp check in 2 weeks.   Zandra Abts MD

## 2014-03-05 NOTE — Progress Notes (Signed)
Patient notified.  New rx sent to pharm & lab order mailed to patient home.  Stated he was at work & would call back to schedule his nurse visit.

## 2014-03-06 ENCOUNTER — Ambulatory Visit (HOSPITAL_COMMUNITY)
Admission: RE | Admit: 2014-03-06 | Discharge: 2014-03-06 | Disposition: A | Discharge status: Home / Self Care | Payer: Managed Care, Other (non HMO) | Source: Ambulatory Visit | Attending: Cardiology | Admitting: Cardiology

## 2014-03-06 DIAGNOSIS — R0602 Shortness of breath: Secondary | ICD-10-CM | POA: Diagnosis present

## 2014-03-06 LAB — PULMONARY FUNCTION TEST
DL/VA % pred: 77 %
DL/VA: 3.65 ml/min/mmHg/L
DLCO cor % pred: 69 %
DLCO cor: 23.51 ml/min/mmHg
DLCO unc % pred: 69 %
DLCO unc: 23.51 ml/min/mmHg
FEF 25-75 Post: 3.61 L/sec
FEF 25-75 Pre: 2.15 L/sec
FEF2575-%Change-Post: 67 %
FEF2575-%Pred-Post: 97 %
FEF2575-%Pred-Pre: 58 %
FEV1-%Change-Post: 15 %
FEV1-%Pred-Post: 78 %
FEV1-%Pred-Pre: 67 %
FEV1-Post: 3.23 L
FEV1-Pre: 2.79 L
FEV1FVC-%Change-Post: 7 %
FEV1FVC-%Pred-Pre: 94 %
FEV6-%Change-Post: 8 %
FEV6-%Pred-Post: 79 %
FEV6-%Pred-Pre: 73 %
FEV6-Post: 4.09 L
FEV6-Pre: 3.78 L
FEV6FVC-%Change-Post: 0 %
FEV6FVC-%Pred-Post: 102 %
FEV6FVC-%Pred-Pre: 102 %
FVC-%Change-Post: 7 %
FVC-%Pred-Post: 77 %
FVC-%Pred-Pre: 71 %
FVC-Post: 4.09 L
FVC-Pre: 3.8 L
Post FEV1/FVC ratio: 79 %
Post FEV6/FVC ratio: 100 %
Pre FEV1/FVC ratio: 73 %
Pre FEV6/FVC Ratio: 99 %
RV % pred: 142 %
RV: 2.89 L
TLC % pred: 91 %
TLC: 6.57 L

## 2014-03-06 MED ORDER — ALBUTEROL SULFATE (2.5 MG/3ML) 0.083% IN NEBU
2.5000 mg | INHALATION_SOLUTION | Freq: Once | RESPIRATORY_TRACT | Status: AC
  Administered 2014-03-06: 2.5 mg via RESPIRATORY_TRACT

## 2014-03-16 ENCOUNTER — Other Ambulatory Visit: Payer: Self-pay | Admitting: Cardiology

## 2014-03-16 MED ORDER — METOPROLOL TARTRATE 100 MG PO TABS: 100.0000 mg | ORAL_TABLET | Freq: Two times a day (BID) | ORAL | Status: DC

## 2014-03-16 NOTE — Telephone Encounter (Signed)
Received fax refill request  Rx # [id number] Medication:  Metoprolol Tart 100 mg tab Qty 60 Sig:  Take one tablet by mouth two times daily Physician:  Harl Bowie

## 2014-03-25 ENCOUNTER — Other Ambulatory Visit: Payer: Self-pay | Admitting: Cardiology

## 2014-03-25 MED ORDER — ATORVASTATIN CALCIUM 80 MG PO TABS: 80.0000 mg | ORAL_TABLET | Freq: Every day | ORAL | Status: DC

## 2014-03-25 NOTE — Telephone Encounter (Signed)
Refill complete 

## 2014-03-25 NOTE — Telephone Encounter (Signed)
Received fax refill request  Rx # [id number] Medication:  Atorvastatin 80 mg tablet Qty 30 Sig:  Take one tablet by mouth every day at 6 pm Physician:  Harl Bowie

## 2014-04-03 ENCOUNTER — Telehealth: Payer: Self-pay | Admitting: *Deleted

## 2014-04-03 MED ORDER — NITROGLYCERIN 0.4 MG SL SUBL: 0.4000 mg | SUBLINGUAL_TABLET | SUBLINGUAL | Status: DC | PRN

## 2014-04-03 NOTE — Telephone Encounter (Signed)
Received refill request for nitro and atorvastatin 80 mg. Refilled nitro, atorvastatin was refilled on 12/29.

## 2014-04-06 ENCOUNTER — Other Ambulatory Visit: Payer: Self-pay | Admitting: Cardiology

## 2014-04-06 ENCOUNTER — Telehealth: Payer: Self-pay | Admitting: Cardiology

## 2014-04-06 MED ORDER — METOPROLOL TARTRATE 50 MG PO TABS: 50.0000 mg | ORAL_TABLET | Freq: Two times a day (BID) | ORAL | Status: DC

## 2014-04-06 NOTE — Telephone Encounter (Signed)
Received fax refill request  Rx # [id number] Medication:  Atorvastatin 80 mg tablet Qty 30 Sig:  Take on tablet by mouth every day at 6 pm Physician:  Harl Bowie

## 2014-04-06 NOTE — Telephone Encounter (Signed)
Metoprolol refill needed / tgs

## 2014-04-07 ENCOUNTER — Telehealth: Payer: Self-pay | Admitting: *Deleted

## 2014-04-07 NOTE — Telephone Encounter (Signed)
-----   Message from Arnoldo Lenis, MD sent at 04/02/2014 10:06 AM EST ----- PFTs do show some evidence of COPD. I believe he already sees Dr Luan Pulling, please confirm and send a copy to Dr Luan Pulling office   Zandra Abts MD

## 2014-04-07 NOTE — Telephone Encounter (Signed)
Notes Recorded by Laurine Blazer, LPN on 4/49/7530 at 0:51 AM Left message to return call.

## 2014-04-07 NOTE — Telephone Encounter (Signed)
Notes Recorded by Laurine Blazer, LPN on 0/35/5974 at 1:63 PM Patient walked into office for results. Notified, results will be forwarded to PMD Luan Pulling).

## 2014-04-10 ENCOUNTER — Encounter: Payer: Self-pay | Admitting: *Deleted

## 2014-04-24 ENCOUNTER — Other Ambulatory Visit: Payer: Self-pay

## 2014-04-24 ENCOUNTER — Encounter: Payer: Self-pay | Admitting: Gastroenterology

## 2014-04-24 ENCOUNTER — Ambulatory Visit (INDEPENDENT_AMBULATORY_CARE_PROVIDER_SITE_OTHER): Payer: Managed Care, Other (non HMO) | Admitting: Gastroenterology

## 2014-04-24 VITALS — BP 148/100 | HR 64 | Temp 98.4°F | Ht 71.0 in | Wt 182.8 lb

## 2014-04-24 DIAGNOSIS — R1314 Dysphagia, pharyngoesophageal phase: Secondary | ICD-10-CM | POA: Insufficient documentation

## 2014-04-24 DIAGNOSIS — R160 Hepatomegaly, not elsewhere classified: Secondary | ICD-10-CM

## 2014-04-24 DIAGNOSIS — K921 Melena: Secondary | ICD-10-CM

## 2014-04-24 MED ORDER — PEG-KCL-NACL-NASULF-NA ASC-C 100 G PO SOLR: 1.0000 | Freq: Once | ORAL | Status: AC

## 2014-04-24 NOTE — Progress Notes (Signed)
Primary Care Physician:  Alonza Bogus, MD Primary Gastroenterologist:  Dr. Gala Romney   Chief Complaint  Patient presents with  . Colonoscopy    HPI:   Timothy Walker is a 49 y.o. male presenting today as a self-referral secondary to dysphagia and rectal bleeding.   States he has been seen by Dr. Gala Romney in the remote past. Notes multiple dilations in the past. Operative reports not available at time of visit but have been requested.   Notes recurrent esophageal dysphagia, feels like it is getting worse. Will drink liquids and feels like swallowing a golf ball. Feels it as it goes all the way down.   Hx of MI about a year ago. Difficulty taking meds sometimes. Solid food dysphagia. After eating, notes abdominal bloating 20-30 minutes later. Seems like he isn't burping or able to burp. Feels like he has to prime a burp. Notes odynophagia. Stomach gurgles/bubbles all the time. States he has gained a lot of weight over Christmas holidays. No N/V.   Paper hematochezia noted. No prior colonoscopy. Painless hematochezia. No constipation. No significant bowel habit changes. Protonix BID.   Past Medical History  Diagnosis Date  . Coronary artery disease     a. 12/2012 Inf STEMI/Cath/PCI: LM nl, LAD nl, D1 50ost, D2 min irregs, D3 small, LCX 80-66m OM1/2/3 min irregs, RI 20p, RCA 50p/100d (3.5x18 Xience DEs),PDA/PL nl, EF 559%- complicated by VF/CGS/IABP/VDRF  . Hypertension   . Ischemic cardiomyopathy     a. 12/2012 Echo: EF 45-50%, basal inf, inflat, mid inf HK, mildly reduced RV fxn.  . Tobacco abuse   . Marijuana abuse   . Obstructive sleep apnea   . GERD (gastroesophageal reflux disease)   . Hiatal hernia   . MI (myocardial infarction) 2014  . COPD (chronic obstructive pulmonary disease)     Past Surgical History  Procedure Laterality Date  . Dental surgery    . Nasal septum surgery    . Coronary angioplasty  01/13/2013    STENT TO RCA BY DR COOPER  . Left heart  catheterization with coronary angiogram N/A 01/13/2013    Procedure: LEFT HEART CATHETERIZATION WITH CORONARY ANGIOGRAM;  Surgeon: MWellington Hampshire MD;  [facility];  Service: Cardiovascular;  Laterality: N/A;  . Percutaneous coronary stent intervention (pci-s)  01/13/2013    Procedure: PERCUTANEOUS CORONARY STENT INTERVENTION (PCI-S);  Surgeon: MWellington Hampshire MD;  [facility];  Service: Cardiovascular;;    Current Outpatient Prescriptions  Medication Sig Dispense Refill  . acetaminophen (TYLENOL) 500 MG tablet Take 500 mg by mouth daily as needed for mild pain, moderate pain or headache.     .Marland Kitchenaspirin 81 MG tablet Take 81 mg by mouth every morning.     .Marland Kitchenatorvastatin (LIPITOR) 80 MG tablet Take 1 tablet (80 mg total) by mouth daily at 6 PM. 30 tablet 6  . citalopram (CELEXA) 20 MG tablet Take 1 tablet by mouth daily.    .Marland Kitchenlosartan (COZAAR) 100 MG tablet Take 1 tablet (100 mg total) by mouth daily. 30 tablet 6  . metoprolol (LOPRESSOR) 100 MG tablet Take 1 tablet (100 mg total) by mouth 2 (two) times daily. (Patient taking differently: Take 150 mg by mouth 2 (two) times daily. ) 60 tablet 6  . metoprolol (LOPRESSOR) 50 MG tablet Take 1 tablet (50 mg total) by mouth 2 (two) times daily. 60 tablet 6  . nitroGLYCERIN (NITROSTAT) 0.4 MG SL tablet Place 1 tablet (0.4  mg total) under the tongue every 5 (five) minutes as needed for chest pain. 25 tablet 3  . pantoprazole (PROTONIX) 40 MG tablet Take 40 mg by mouth 2 (two) times daily before a meal.    . peg 3350 powder (MOVIPREP) 100 G SOLR Take 1 kit (200 g total) by mouth once. 1 kit 0   No current facility-administered medications for this visit.    Allergies as of 04/24/2014  . (No Known Allergies)    Family History  Problem Relation Age of Onset  . Colon cancer Neg Hx     History   Social History  . Marital Status: Divorced    Spouse Name: N/A    Number of Children: N/A  . Years of Education: N/A    Occupational History  . Not on file.   Social History Main Topics  . Smoking status: Current Every Day Smoker -- 1.50 packs/day for 31 years    Types: Cigarettes    Start date: 11/21/1981  . Smokeless tobacco: Never Used     Comment: stopped on 01/13/13 but restarted after going home  . Alcohol Use: 1.2 oz/week    1 Cans of beer, 1 Shots of liquor per week     Comment: social  . Drug Use: Yes    Special: Marijuana  . Sexual Activity: Not on file   Other Topics Concern  . Not on file   Social History Narrative    Review of Systems: As mentioned in HPI.   Physical Exam: BP 148/100 mmHg  Pulse 64  Temp(Src) 98.4 F (36.9 C) (Oral)  Ht 5' 11"  (1.803 m)  Wt 182 lb 12.8 oz (82.918 kg)  BMI 25.51 kg/m2 General:   Alert and oriented. Pleasant and cooperative. Well-nourished and well-developed.  Head:  Normocephalic and atraumatic. Eyes:  Without icterus, sclera clear and conjunctiva pink.  Ears:  Normal auditory acuity. Nose:  No deformity, discharge,  or lesions. Mouth:  No deformity or lesions, oral mucosa pink.  Lungs:  Clear to auscultation bilaterally. No wheezes, rales, or rhonchi. No distress.  Heart:  S1, S2 present without murmurs appreciated.  Abdomen:  +BS, soft, non-tender and non-distended. No rebound or guarding. Liver margin palpable several fingerbreadths below right costal margin Rectal:  Deferred  Msk:  Symmetrical without gross deformities. Normal posture.. Extremities:  Without edema. Neurologic:  Alert and  oriented x4;  grossly normal neurologically. Skin:  Intact without significant lesions or rashes. Psych:  Alert and cooperative. Normal mood and affect.

## 2014-04-24 NOTE — Patient Instructions (Signed)
We have scheduled you for a colonoscopy, upper endoscopy, and dilation with Dr. Gala Romney in the near future.   I have also ordered an ultrasound of your liver.   Further recommendations to follow!

## 2014-04-29 NOTE — Assessment & Plan Note (Addendum)
Intermittent low-volume painless hematochezia without any other concerning lower GI symptoms. Likely benign anorectal source. Needs colonoscopy in near future for further assessment.   Proceed with TCS with Dr. Gala Romney in near future: the risks, benefits, and alternatives have been discussed with the patient in detail. The patient states understanding and desires to proceed. Propofol due to marijuana

## 2014-04-29 NOTE — Assessment & Plan Note (Signed)
Noted on exam. US abdomen ordered.

## 2014-04-29 NOTE — Assessment & Plan Note (Signed)
49 year old male with history of dysphagia in the remote past, noting multiple dilatations in the past (operative reports from [facility] requested), now with recurrent solid food and pill dysphagia, intermittent odynophagia. Continues Protonix BID. Needs EGD with dilatation in the near future to assess for web, ring, stricture, esophagitis.   Proceed with upper endoscopy and dilatation in the near future with Dr. Gala Romney. The risks, benefits, and alternatives have been discussed in detail with patient. They have stated understanding and desire to proceed.  PROPOFOL due to marijuana use  Protonix BID

## 2014-05-01 ENCOUNTER — Encounter: Payer: Self-pay | Admitting: Cardiology

## 2014-05-01 ENCOUNTER — Ambulatory Visit (INDEPENDENT_AMBULATORY_CARE_PROVIDER_SITE_OTHER): Payer: Managed Care, Other (non HMO) | Admitting: Cardiology

## 2014-05-01 ENCOUNTER — Ambulatory Visit (HOSPITAL_COMMUNITY)
Admission: RE | Admit: 2014-05-01 | Discharge: 2014-05-01 | Disposition: A | Discharge status: Home / Self Care | Payer: Managed Care, Other (non HMO) | Source: Ambulatory Visit | Attending: Gastroenterology | Admitting: Gastroenterology

## 2014-05-01 VITALS — BP 144/88 | HR 68 | Ht 71.0 in | Wt 176.0 lb

## 2014-05-01 DIAGNOSIS — R160 Hepatomegaly, not elsewhere classified: Secondary | ICD-10-CM | POA: Diagnosis present

## 2014-05-01 DIAGNOSIS — Z720 Tobacco use: Secondary | ICD-10-CM

## 2014-05-01 DIAGNOSIS — I10 Essential (primary) hypertension: Secondary | ICD-10-CM

## 2014-05-01 DIAGNOSIS — E785 Hyperlipidemia, unspecified: Secondary | ICD-10-CM

## 2014-05-01 DIAGNOSIS — I2510 Atherosclerotic heart disease of native coronary artery without angina pectoris: Secondary | ICD-10-CM

## 2014-05-01 MED ORDER — AMLODIPINE BESYLATE 5 MG PO TABS: 5.0000 mg | ORAL_TABLET | Freq: Every day | ORAL | Status: DC

## 2014-05-01 MED ORDER — BP MONITOR-STETHOSCOPE KIT: 1.0000 | PACK | Freq: Every day | Status: DC

## 2014-05-01 NOTE — Progress Notes (Signed)
Patient ID: Timothy Walker, male   DOB: [birth year], 49 y.o.   MRN: [id number]     Clinical Summary Timothy Walker is a 49 y.o.male seen today for follow up of the following medical problems.   1. CAD  - admit to [facility] 01/13/13 to 01/17/13 with STEMI, DES to RCA.  - patient presented in cardiogenic shock, suffered VT/VF arrest requiring multiple defibrillations and IV amiodarone. Following PCI required balloon pump and vasopressors and temporary transvenous pacing.  - Echo LVEF 45-50%, inferior hypokinesis, normal diastolic function   - since last visit he denies any chest pain. No SOB or DOE. Compliant with meds   2. HTN  - does not check bp at home  - home bp log 140-150s/90s-100s   3. Tobacco abuse  - has recently tried nicotine patches and ecigs without much benefit. Continues to smoke  4. Hyperlipidemia  - compliant with statin      Past Medical History  Diagnosis Date  . Coronary artery disease     a. 12/2012 Inf STEMI/Cath/PCI: LM nl, LAD nl, D1 50ost, D2 min irregs, D3 small, LCX 80-44m OM1/2/3 min irregs, RI 20p, RCA 50p/100d (3.5x18 Xience DEs),PDA/PL nl, EF 566%- complicated by VF/CGS/IABP/VDRF  . Hypertension   . Ischemic cardiomyopathy     a. 12/2012 Echo: EF 45-50%, basal inf, inflat, mid inf HK, mildly reduced RV fxn.  . Tobacco abuse   . Marijuana abuse   . Obstructive sleep apnea   . GERD (gastroesophageal reflux disease)   . Hiatal hernia   . MI (myocardial infarction) 2014  . COPD (chronic obstructive pulmonary disease)      No Known Allergies   Current Outpatient Prescriptions  Medication Sig Dispense Refill  . acetaminophen (TYLENOL) 500 MG tablet Take 500 mg by mouth daily as needed for mild pain, moderate pain or headache.     .Marland Kitchenaspirin 81 MG tablet Take 81 mg by mouth every morning.     .Marland Kitchenatorvastatin (LIPITOR) 80 MG tablet Take 1 tablet (80 mg total) by mouth daily at 6 PM. 30 tablet 6  . citalopram (CELEXA) 20 MG tablet Take 1  tablet by mouth daily.    .Marland Kitchenlosartan (COZAAR) 100 MG tablet Take 1 tablet (100 mg total) by mouth daily. 30 tablet 6  . metoprolol (LOPRESSOR) 100 MG tablet Take 1 tablet (100 mg total) by mouth 2 (two) times daily. (Patient taking differently: Take 150 mg by mouth 2 (two) times daily. ) 60 tablet 6  . metoprolol (LOPRESSOR) 50 MG tablet Take 1 tablet (50 mg total) by mouth 2 (two) times daily. 60 tablet 6  . nitroGLYCERIN (NITROSTAT) 0.4 MG SL tablet Place 1 tablet (0.4 mg total) under the tongue every 5 (five) minutes as needed for chest pain. 25 tablet 3  . pantoprazole (PROTONIX) 40 MG tablet Take 40 mg by mouth 2 (two) times daily before a meal.    . peg 3350 powder (MOVIPREP) 100 G SOLR Take 1 kit (200 g total) by mouth once. 1 kit 0   No current facility-administered medications for this visit.     Past Surgical History  Procedure Laterality Date  . Dental surgery    . Nasal septum surgery    . Coronary angioplasty  01/13/2013    STENT TO RCA BY DR COOPER  . Left heart catheterization with coronary angiogram N/A 01/13/2013    Procedure: LEFT HEART CATHETERIZATION WITH CORONARY ANGIOGRAM;  Surgeon: MWellington Hampshire MD;  [facility];  Service: Cardiovascular;  Laterality: N/A;  . Percutaneous coronary stent intervention (pci-s)  01/13/2013    Procedure: PERCUTANEOUS CORONARY STENT INTERVENTION (PCI-S);  Surgeon: Wellington Hampshire, MD;  Location: [facility] CATH LAB;  Service: Cardiovascular;;     No Known Allergies    Family History  Problem Relation Age of Onset  . Colon cancer Neg Hx      Social History Timothy Walker reports that he has been smoking Cigarettes.  He started smoking about 32 years ago. He has a 46.5 pack-year smoking history. He has never used smokeless tobacco. Mr. Favorite reports that he drinks about 1.2 oz of alcohol per week.   Review of Systems CONSTITUTIONAL: No weight loss, fever, chills, weakness or fatigue.  HEENT: Eyes: No visual loss, blurred  vision, double vision or yellow sclerae.No hearing loss, sneezing, congestion, runny nose or sore throat.  SKIN: No rash or itching.  CARDIOVASCULAR: per HPI RESPIRATORY: No shortness of breath, cough or sputum.  GASTROINTESTINAL: No anorexia, nausea, vomiting or diarrhea. No abdominal pain or blood.  GENITOURINARY: No burning on urination, no polyuria NEUROLOGICAL: No headache, dizziness, syncope, paralysis, ataxia, numbness or tingling in the extremities. No change in bowel or bladder control.  MUSCULOSKELETAL: No muscle, back pain, joint pain or stiffness.  LYMPHATICS: No enlarged nodes. No history of splenectomy.  PSYCHIATRIC: No history of depression or anxiety.  ENDOCRINOLOGIC: No reports of sweating, cold or heat intolerance. No polyuria or polydipsia.  Marland Kitchen   Physical Examination p 68 bp 144/88 Wt 176 lbs BMI 25 Gen: resting comfortably, no acute distress HEENT: no scleral icterus, pupils equal round and reactive, no palptable cervical adenopathy,  CV: RRR, no m/r/g, no JVD, no carotid bruits Resp: Clear to auscultation bilaterally GI: abdomen is soft, non-tender, non-distended, normal bowel sounds, no hepatosplenomegaly MSK: extremities are warm, no edema.  Skin: warm, no rash Neuro:  no focal deficits Psych: appropriate affect   Diagnostic Studies Cath 12/2012 Hemodynamics:  AO: 119/82 mmHg  LV: 119/15 mmHg  LVEDP: 28 mmHg  Coronary angiography:  Coronary dominance: right   Left Main: normal   Left Anterior Descending (LAD): normal in size with minor irregularities throughout its course but no evidence of obstructive disease.   1st diagonal (D1): small in size with 50% ostial stenosis.   2nd diagonal (D2): small in size with minor irregularities.   3rd diagonal (D3): very small in size.   Circumflex (LCx): normal in size and nondominant. There is an 80-90% mid stenosis at the origin of OM 2.   1st obtuse marginal: small in size with minor  irregularities.   2nd obtuse marginal: normal in size with no significant disease.   3rd obtuse marginal: medium in size with no significant disease.   Ramus Intermedius: normal in size with 20% proximal stenosis.   Right Coronary Artery: large in size and dominant. There is 50% proximal stenosis. The vessel is occluded distally with large thrombus burden.   Posterior descending artery: normal   Posterior AV segment: normal   Posterolateral branchs: normal Left ventriculography: Left ventricular systolic function is normal , LVEF is estimated at 55 %, there is no significant mitral regurgitation . mild inferior wall hypokinesis  PCI Data:  Vessel - distal RCA/Segment - 3  Percent Stenosis (pre) 100%  TIMI-flow 0  Stent 3.5 x 18 mm Xience expedition drug-eluting stent  Percent Stenosis (post) 0%  TIMI-flow (post) 3  Final Conclusions:  1. Refractory ventricular fibrillation and cardiogenic shock due to inferior  ST elevation myocardial infarction. Occluded distal right coronary artery. There is significant bifurcation disease in the mid left circumflex supplying an overall small to medium sized territory.  2. Profound hypotension on presentation with subsequent improvement with revascularization and intra-aortic balloon pump placement.  3. Normal LV systolic function with moderately elevated left ventricular end-diastolic pressure.  4. Successful PCI and drug-eluting stent placement to the right coronary artery.   01/13/13 Echo:  LVEF 45-50%, inferior hypokinesis, normal diastolic function   96/11/64 14 day event monitor: no significant arrhythmias, palpitations correlate with sinus rhythm, occasional PVCs.   02/2014 PFTs + obstruction, + COPD   Assessment and Plan  1. CAD  - 35/3912 STEMI complicated by cardiogenic shock and VT/VF arrest, s/p PCI to RCA. LVEF 45-50% - no current symptoms. Continue secondary prevention and risk factor modification  2.  HTN  - above goal, will start norvasc 27m daily. He will keep bp log x 2 weeks and call w/ results  3. Hyperlipidemia  - continue high dose statin in setting of CAD  4. Tobacco  - encouraged cessation - PFTs showed some COPD, he has not follow up with pcp yet.    F/u 4 months   JArnoldo Lenis M.D.

## 2014-05-01 NOTE — Patient Instructions (Addendum)
Your physician recommends that you schedule a follow-up appointment in: June WITH DR. [place]  Your physician has recommended you make the following change in your medication:   START AMLODIPINE 5 MG DAILY  WE HAVE SENT BLOOD PRESSURE MONITOR RX   CONTINUE ALL OTHER MEDICATIONS AS DIRECTED  Your physician has requested that you regularly monitor and record your blood pressure readings at home FOR 2 WEEKS AND CALL OR BRING BY LOG FOR REVIEW. Please use the same machine at the same time of day to check your readings and record them to bring to your follow-up visit.  Thank you for choosing [facility]!!

## 2014-05-07 NOTE — Patient Instructions (Signed)
KAIEN PEZZULLO  05/07/2014   Your procedure is scheduled on:  Thursday, 05/14/14  Report to [facility] at 0930 AM.  Call this number if you have problems the morning of surgery: [phone]   Remember:   Do not eat food or drink liquids after midnight.   Take these medicines the morning of surgery with A SIP OF WATER: amlodipine, citalopram, losartan, metoprolol, and protonix.   Do not wear jewelry, make-up or nail polish.  Do not wear lotions, powders, or perfumes. You may wear deodorant.  Do not shave 48 hours prior to surgery. Men may shave face and neck.  Do not bring valuables to the hospital.  [facility] is not responsible                  for any belongings or valuables.               Contacts, dentures or bridgework may not be worn into surgery.  Leave suitcase in the car. After surgery it may be brought to your room.  For patients admitted to the hospital, discharge time is determined by your                treatment team.               Patients discharged the day of surgery will not be allowed to drive  home.  Name and phone number of your driver: family  Special Instructions: Please follow special instructions regarding diet that was given to you in the office.   Please read over the following fact sheets that you were given: Pain Booklet, Anesthesia Post-op Instructions and Care and Recovery After Surgery   Esophagogastroduodenoscopy Care After Refer to this sheet in the next few weeks. These instructions provide you with information on caring for yourself after your procedure. Your caregiver may also give you more specific instructions. Your treatment has been planned according to current medical practices, but problems sometimes occur. Call your caregiver if you have any problems or questions after your procedure.  HOME CARE INSTRUCTIONS  Do not eat or drink anything until the numbing medicine (local anesthetic) has worn off and your gag reflex has returned. You will  know that the local anesthetic has worn off when you can swallow comfortably.  Do not drive for 12 hours after the procedure or as directed by your caregiver.  Only take medicines as directed by your caregiver. SEEK MEDICAL CARE IF:   You cannot stop coughing.  You are not urinating at all or less than usual. SEEK IMMEDIATE MEDICAL CARE IF:  You have difficulty swallowing.  You cannot eat or drink.  You have worsening throat or chest pain.  You have dizziness, lightheadedness, or you faint.  You have nausea or vomiting.  You have chills.  You have a fever.  You have severe abdominal pain.  You have black, tarry, or bloody stools. Document Released: 02/28/2012 Document Reviewed: 02/28/2012 [facility] Patient Information 2015 [place]. This information is not intended to replace advice given to you by your health care provider. Make sure you discuss any questions you have with your health care provider. Colonoscopy, Care After Refer to this sheet in the next few weeks. These instructions provide you with information on caring for yourself after your procedure. Your health care provider may also give you more specific instructions. Your treatment has been planned according to current medical practices, but problems sometimes occur. Call your health care provider if you  have any problems or questions after your procedure. WHAT TO EXPECT AFTER THE PROCEDURE  After your procedure, it is typical to have the following:  A small amount of blood in your stool.  Moderate amounts of gas and mild abdominal cramping or bloating. HOME CARE INSTRUCTIONS  Do not drive, operate machinery, or sign important documents for 24 hours.  You may shower and resume your regular physical activities, but move at a slower pace for the first 24 hours.  Take frequent rest periods for the first 24 hours.  Walk around or put a warm pack on your abdomen to help reduce abdominal cramping and  bloating.  Drink enough fluids to keep your urine clear or pale yellow.  You may resume your normal diet as instructed by your health care provider. Avoid heavy or fried foods that are hard to digest.  Avoid drinking alcohol for 24 hours or as instructed by your health care provider.  Only take over-the-counter or prescription medicines as directed by your health care provider.  If a tissue sample (biopsy) was taken during your procedure:  Do not take aspirin or blood thinners for 7 days, or as instructed by your health care provider.  Do not drink alcohol for 7 days, or as instructed by your health care provider.  Eat soft foods for the first 24 hours. SEEK MEDICAL CARE IF: You have persistent spotting of blood in your stool 2-3 days after the procedure. SEEK IMMEDIATE MEDICAL CARE IF:  You have more than a small spotting of blood in your stool.  You pass large blood clots in your stool.  Your abdomen is swollen (distended).  You have nausea or vomiting.  You have a fever.  You have increasing abdominal pain that is not relieved with medicine. Document Released: 10/26/2003 Document Revised: 01/01/2013 Document Reviewed: 11/18/2012 [facility] Patient Information 2015 [place], [place]. This information is not intended to replace advice given to you by your health care provider. Make sure you discuss any questions you have with your health care provider.

## 2014-05-07 NOTE — Pre-Procedure Instructions (Signed)
Dr. Patsey Berthold consulted regarding extensive cardiac history. No cardiac clearance needed since he had recent appointment with Dr. Harl Bowie on 05/01/14 and has had no c/o chest pain.

## 2014-05-08 ENCOUNTER — Encounter (HOSPITAL_COMMUNITY)
Admission: RE | Admit: 2014-05-08 | Discharge: 2014-05-08 | Disposition: A | Discharge status: Home / Self Care | Payer: Managed Care, Other (non HMO) | Source: Ambulatory Visit | Attending: Internal Medicine | Admitting: Internal Medicine

## 2014-05-14 ENCOUNTER — Encounter (HOSPITAL_COMMUNITY): Payer: Self-pay | Admitting: *Deleted

## 2014-05-14 ENCOUNTER — Encounter (HOSPITAL_COMMUNITY)
Admission: RE | Disposition: A | Discharge status: Home / Self Care | Payer: Self-pay | Source: Ambulatory Visit | Attending: Internal Medicine

## 2014-05-14 ENCOUNTER — Ambulatory Visit (HOSPITAL_COMMUNITY): Payer: Managed Care, Other (non HMO) | Admitting: Anesthesiology

## 2014-05-14 ENCOUNTER — Ambulatory Visit (HOSPITAL_COMMUNITY)
Admission: RE | Admit: 2014-05-14 | Discharge: 2014-05-14 | Disposition: A | Discharge status: Home / Self Care | Payer: Managed Care, Other (non HMO) | Source: Ambulatory Visit | Attending: Internal Medicine | Admitting: Internal Medicine

## 2014-05-14 DIAGNOSIS — K219 Gastro-esophageal reflux disease without esophagitis: Secondary | ICD-10-CM | POA: Diagnosis not present

## 2014-05-14 DIAGNOSIS — J449 Chronic obstructive pulmonary disease, unspecified: Secondary | ICD-10-CM | POA: Diagnosis not present

## 2014-05-14 DIAGNOSIS — Z7982 Long term (current) use of aspirin: Secondary | ICD-10-CM | POA: Diagnosis not present

## 2014-05-14 DIAGNOSIS — R1319 Other dysphagia: Secondary | ICD-10-CM | POA: Insufficient documentation

## 2014-05-14 DIAGNOSIS — K635 Polyp of colon: Secondary | ICD-10-CM

## 2014-05-14 DIAGNOSIS — K5730 Diverticulosis of large intestine without perforation or abscess without bleeding: Secondary | ICD-10-CM | POA: Insufficient documentation

## 2014-05-14 DIAGNOSIS — F17210 Nicotine dependence, cigarettes, uncomplicated: Secondary | ICD-10-CM | POA: Insufficient documentation

## 2014-05-14 DIAGNOSIS — K2289 Other specified disease of esophagus: Secondary | ICD-10-CM | POA: Insufficient documentation

## 2014-05-14 DIAGNOSIS — K921 Melena: Secondary | ICD-10-CM | POA: Diagnosis not present

## 2014-05-14 DIAGNOSIS — K648 Other hemorrhoids: Secondary | ICD-10-CM | POA: Diagnosis not present

## 2014-05-14 DIAGNOSIS — K229 Disease of esophagus, unspecified: Secondary | ICD-10-CM

## 2014-05-14 DIAGNOSIS — G4733 Obstructive sleep apnea (adult) (pediatric): Secondary | ICD-10-CM | POA: Insufficient documentation

## 2014-05-14 DIAGNOSIS — Z79899 Other long term (current) drug therapy: Secondary | ICD-10-CM | POA: Diagnosis not present

## 2014-05-14 DIAGNOSIS — K449 Diaphragmatic hernia without obstruction or gangrene: Secondary | ICD-10-CM | POA: Diagnosis not present

## 2014-05-14 DIAGNOSIS — D127 Benign neoplasm of rectosigmoid junction: Secondary | ICD-10-CM | POA: Insufficient documentation

## 2014-05-14 DIAGNOSIS — I10 Essential (primary) hypertension: Secondary | ICD-10-CM | POA: Diagnosis not present

## 2014-05-14 DIAGNOSIS — I2510 Atherosclerotic heart disease of native coronary artery without angina pectoris: Secondary | ICD-10-CM | POA: Diagnosis not present

## 2014-05-14 DIAGNOSIS — R1314 Dysphagia, pharyngoesophageal phase: Secondary | ICD-10-CM

## 2014-05-14 HISTORY — PX: COLONOSCOPY WITH PROPOFOL: SHX5780

## 2014-05-14 HISTORY — PX: POLYPECTOMY: SHX5525

## 2014-05-14 HISTORY — PX: ESOPHAGOGASTRODUODENOSCOPY (EGD) WITH PROPOFOL: SHX5813

## 2014-05-14 HISTORY — PX: ESOPHAGEAL DILATION: SHX303

## 2014-05-14 LAB — BASIC METABOLIC PANEL
Anion gap: 7 (ref 5–15)
BUN: 9 mg/dL (ref 6–23)
CO2: 27 mmol/L (ref 19–32)
Calcium: 9.7 mg/dL (ref 8.4–10.5)
Chloride: 107 mmol/L (ref 96–112)
Creatinine, Ser: 0.79 mg/dL (ref 0.50–1.35)
GFR calc Af Amer: >90 mL/min (ref >90)
GFR calc non Af Amer: >90 mL/min (ref >90)
Glucose, Bld: 97 mg/dL (ref 70–99)
Potassium: 4.2 mmol/L (ref 3.5–5.1)
Sodium: 141 mmol/L (ref 135–145)

## 2014-05-14 LAB — CBC
HCT: 46 % (ref 39.0–52.0)
Hemoglobin: 15.8 g/dL (ref 13.0–17.0)
MCH: 35.3 pg — ABNORMAL HIGH (ref 26.0–34.0)
MCHC: 34.3 g/dL (ref 30.0–36.0)
MCV: 102.7 fL — ABNORMAL HIGH (ref 78.0–100.0)
Platelets: 236 10*3/uL (ref 150–400)
RBC: 4.48 MIL/uL (ref 4.22–5.81)
RDW: 13.5 % (ref 11.5–15.5)
WBC: 9.3 10*3/uL (ref 4.0–10.5)

## 2014-05-14 SURGERY — ESOPHAGOGASTRODUODENOSCOPY (EGD) WITH PROPOFOL
Anesthesia: Monitor Anesthesia Care

## 2014-05-14 MED ORDER — PROPOFOL INFUSION 10 MG/ML OPTIME
INTRAVENOUS | Status: DC | PRN
  Administered 2014-05-14: 75 ug/kg/min via INTRAVENOUS
  Administered 2014-05-14: 11:00:00 via INTRAVENOUS

## 2014-05-14 MED ORDER — MIDAZOLAM HCL 2 MG/2ML IJ SOLN
INTRAMUSCULAR | Status: AC
  Filled 2014-05-14: qty 2

## 2014-05-14 MED ORDER — STERILE WATER FOR IRRIGATION IR SOLN
Status: DC | PRN
  Administered 2014-05-14: 1000 mL

## 2014-05-14 MED ORDER — PROPOFOL 10 MG/ML IV BOLUS
INTRAVENOUS | Status: AC
  Filled 2014-05-14: qty 20

## 2014-05-14 MED ORDER — EPHEDRINE SULFATE 50 MG/ML IJ SOLN
INTRAMUSCULAR | Status: DC | PRN
  Administered 2014-05-14: 10 mg via INTRAVENOUS

## 2014-05-14 MED ORDER — FENTANYL CITRATE 0.05 MG/ML IJ SOLN: 25.0000 ug | INTRAMUSCULAR | Status: DC | PRN

## 2014-05-14 MED ORDER — MIDAZOLAM HCL 2 MG/2ML IJ SOLN
1.0000 mg | INTRAMUSCULAR | Status: DC | PRN
Start: 2014-05-14 — End: 2014-05-14
  Administered 2014-05-14: 2 mg via INTRAVENOUS

## 2014-05-14 MED ORDER — FENTANYL CITRATE 0.05 MG/ML IJ SOLN
INTRAMUSCULAR | Status: DC | PRN
  Administered 2014-05-14 (×2): 50 ug via INTRAVENOUS

## 2014-05-14 MED ORDER — FENTANYL CITRATE 0.05 MG/ML IJ SOLN
25.0000 ug | INTRAMUSCULAR | Status: AC
  Administered 2014-05-14: 25 ug via INTRAVENOUS

## 2014-05-14 MED ORDER — ONDANSETRON HCL 4 MG/2ML IJ SOLN
INTRAMUSCULAR | Status: AC
  Filled 2014-05-14: qty 2

## 2014-05-14 MED ORDER — MIDAZOLAM HCL 5 MG/5ML IJ SOLN
INTRAMUSCULAR | Status: DC | PRN
  Administered 2014-05-14 (×2): 1 mg via INTRAVENOUS

## 2014-05-14 MED ORDER — LIDOCAINE VISCOUS 2 % MT SOLN
OROMUCOSAL | Status: AC
  Filled 2014-05-14: qty 15

## 2014-05-14 MED ORDER — FENTANYL CITRATE 0.05 MG/ML IJ SOLN
INTRAMUSCULAR | Status: AC
  Filled 2014-05-14: qty 2

## 2014-05-14 MED ORDER — ONDANSETRON HCL 4 MG/2ML IJ SOLN: 4.0000 mg | Freq: Once | INTRAMUSCULAR | Status: DC | PRN

## 2014-05-14 MED ORDER — LIDOCAINE VISCOUS 2 % MT SOLN
7.5000 mL | Freq: Two times a day (BID) | OROMUCOSAL | Status: DC
  Administered 2014-05-14 (×2): 7.5 mL via OROMUCOSAL

## 2014-05-14 MED ORDER — LACTATED RINGERS IV SOLN
INTRAVENOUS | Status: DC | PRN
  Administered 2014-05-14: 09:00:00 via INTRAVENOUS
  Administered 2014-05-14: 1000 mL

## 2014-05-14 MED ORDER — GLYCOPYRROLATE 0.2 MG/ML IJ SOLN
0.2000 mg | Freq: Once | INTRAMUSCULAR | Status: AC
Start: 2014-05-14 — End: 2014-05-14
  Administered 2014-05-14: 0.2 mg via INTRAVENOUS
  Filled 2014-05-14: qty 1

## 2014-05-14 MED ORDER — ONDANSETRON HCL 4 MG/2ML IJ SOLN
4.0000 mg | Freq: Once | INTRAMUSCULAR | Status: AC
  Administered 2014-05-14: 4 mg via INTRAVENOUS

## 2014-05-14 MED ORDER — LACTATED RINGERS IV SOLN: INTRAVENOUS | Status: DC

## 2014-05-14 MED ORDER — LIDOCAINE HCL (CARDIAC) 10 MG/ML IV SOLN
INTRAVENOUS | Status: DC | PRN
  Administered 2014-05-14: 25 mg via INTRAVENOUS

## 2014-05-14 SURGICAL SUPPLY — 36 items
BALLN CRE LF 10-12 240X5.5 (BALLOONS)
BALLN DILATOR CRE 12-15 240 (BALLOONS)
BALLN DILATOR CRE 15-18 240 (BALLOONS) IMPLANT
BALLN DILATOR CRE 18-20 240 (BALLOONS) IMPLANT
BALLN DILATOR CRE WIREGUIDE (BALLOONS)
BALLOON CRE LF 10-12 240X5.5 (BALLOONS) IMPLANT
BALLOON DILATOR CRE 12-15 240 (BALLOONS) IMPLANT
BALLOON DILATOR CRE WIREGUIDE (BALLOONS) IMPLANT
BLOCK BITE 60FR ADLT L/F BLUE (MISCELLANEOUS) ×1 IMPLANT
DEVICE CLIP HEMOSTAT 235CM (CLIP) IMPLANT
ELECT REM PT RETURN 9FT ADLT (ELECTROSURGICAL)
ELECTRODE REM PT RTRN 9FT ADLT (ELECTROSURGICAL) IMPLANT
FCP BXJMBJMB 240X2.8X (CUTTING FORCEPS)
FLOOR PAD 36X40 (MISCELLANEOUS) ×3
FORCEPS BIOP RAD 4 LRG CAP 4 (CUTTING FORCEPS) ×1 IMPLANT
FORCEPS BIOP RJ4 240 W/NDL (CUTTING FORCEPS)
FORCEPS BXJMBJMB 240X2.8X (CUTTING FORCEPS) IMPLANT
FORMALIN 10 PREFIL 20ML (MISCELLANEOUS) ×2 IMPLANT
INJECTOR/SNARE I SNARE (MISCELLANEOUS) IMPLANT
KIT CLEAN ENDO COMPLIANCE (KITS) ×3 IMPLANT
LUBRICANT JELLY 4.5OZ STERILE (MISCELLANEOUS) ×1 IMPLANT
MANIFOLD NEPTUNE II (INSTRUMENTS) ×1 IMPLANT
NDL SCLEROTHERAPY 25GX240 (NEEDLE) IMPLANT
NEEDLE SCLEROTHERAPY 25GX240 (NEEDLE) IMPLANT
PAD FLOOR 36X40 (MISCELLANEOUS) IMPLANT
PROBE APC STR FIRE (PROBE) IMPLANT
PROBE INJECTION GOLD (MISCELLANEOUS)
PROBE INJECTION GOLD 7FR (MISCELLANEOUS) IMPLANT
SNARE ROTATE MED OVAL 20MM (MISCELLANEOUS) IMPLANT
SNARE SHORT THROW 13M SML OVAL (MISCELLANEOUS) ×3 IMPLANT
SYR 50ML LL SCALE MARK (SYRINGE) ×1 IMPLANT
SYR INFLATE BILIARY GAUGE (MISCELLANEOUS) IMPLANT
SYR INFLATION 60ML (SYRINGE) ×3 IMPLANT
TRAP SPECIMEN MUCOUS 40CC (MISCELLANEOUS) IMPLANT
TUBING IRRIGATION ENDOGATOR (MISCELLANEOUS) ×1 IMPLANT
WATER STERILE IRR 1000ML POUR (IV SOLUTION) ×1 IMPLANT

## 2014-05-14 NOTE — Op Note (Signed)
[facility] [address] [place], [address]   ENDOSCOPY PROCEDURE REPORT  PATIENT: Timothy Walker, Timothy Walker  MR#: [id number] BIRTHDATE: 28-Jun-1965 , 49  yrs. old GENDER: male ENDOSCOPIST: R.  Garfield Cornea, MD FACP FACG REFERRED BY:  Sinda Du, M.D. PROCEDURE DATE:  05-Jun-2014 PROCEDURE:  EGD w/ biopsy and Maloney dilation of esophagus INDICATIONS:  esophageal dysphagia. MEDICATIONS: deep sedation per Dr.  [facility] ASA CLASS:      Class II  CONSENT: The risks, benefits, limitations, alternatives and imponderables have been discussed.  The potential for biopsy, esophogeal dilation, etc. have also been reviewed.  Questions have been answered.  All parties agreeable.  Please see the history and physical in the medical record for more information.  DESCRIPTION OF PROCEDURE: After the risks benefits and alternatives of the procedure were thoroughly explained, informed consent was obtained.  The    endoscope was introduced through the mouth and advanced to the second portion of the duodenum , limited by Without limitations. The instrument was slowly withdrawn as the mucosa was fully examined.    Slightly edematous distal esophageal mucosa.  No obvious varices. No erosion ulceration.  No fixed obstructing lesion.  No ring, web or stricture.  No endoscopic findings consistent with eosinophilic esophagitis.  EGD junction easily traversed.  Stomach empty.  Small hiatal hernia.  Normal gastric mucosa.  Patent pylorus.  Normal first and second portion of the duodenum.  Scope was withdrawn and a 56 [place] Maloney dilator was  passed to full insertion easily.  A look back, reveal no apparent complication related to this maneuver.  I biopsied the mid and distal esophageal mucosa for histologic study.  Retroflexed views revealed as previously described.     The scope was then withdrawn from the patient and the procedure completed.  COMPLICATIONS: There were no  immediate complications.  ENDOSCOPIC IMPRESSION: Subtly abnormal distal esophagus as described?"status post passage of a Maloney dilator. Status post esophageal biopsy. Small hiatal hernia.  RECOMMENDATIONS: Continue Protonix 40 mg twice daily. Follow up on pathology. See colonoscopy report.  REPEAT EXAM:  eSigned:  R. Garfield Cornea, MD Rosalita Chessman [facility] 06-05-14 11:26 AM    CC:  CPT CODES: ICD CODES:  The ICD and CPT codes recommended by this software are interpretations from the data that the clinical staff has captured with the software.  The verification of the translation of this report to the ICD and CPT codes and modifiers is the sole responsibility of the health care institution and practicing physician where this report was generated.  [employer]. will not be held responsible for the validity of the ICD and CPT codes included on this report.  AMA assumes no liability for data contained or not contained herein. CPT is a Designer, television/film set of the [employer].  PATIENT NAME:  Sherwood, Castilla MR#: [id number]

## 2014-05-14 NOTE — Addendum Note (Signed)
Addendum  created 05/14/14 1128 by Vista Deck, CRNA   Modules edited: Charges VN

## 2014-05-14 NOTE — Op Note (Signed)
[place], [address]   1COLONOSCOPY PROCEDURE REPORT  PATIENT: Timothy Walker, Timothy Walker  MR#: [id number] BIRTHDATE: December 13, 1965 , 75  yrs. old GENDER: male ENDOSCOPIST: R.  Garfield Cornea, MD FACP [facility] REFERRED UP:JSRPRX Luan Pulling, M.D. PROCEDURE DATE:  2014-05-29 PROCEDURE:   Colonoscopy with biopsy INDICATIONS:Paper hematochezia. MEDICATIONS: Deep sedation per Dr.  [facility] ASA CLASS:       Class II  CONSENT: The risks, benefits, alternatives and imponderables including but not limited to bleeding, perforation as well as the possibility of a missed lesion have been reviewed.  The potential for biopsy, lesion removal, etc. have also been discussed. Questions have been answered.  All parties agreeable.  Please see the history and physical in the medical record for more information.  DESCRIPTION OF PROCEDURE:   After the risks benefits and alternatives of the procedure were thoroughly explained, informed consent was obtained.  The digital rectal exam      The endoscope was introduced through the anus and advanced to the cecum, which was identified by both the appendix and ileocecal valve. No adverse events experienced.   The quality of the prep was adequate  The instrument was then slowly withdrawn as the colon was fully examined.      COLON FINDINGS: Single external hemorrhoidal tag.  Otherwise, normal appearing rectal mucosa.  Patient had a clustering of the cecal and ascending diverticula; the remainder of the colonic mucosa.  Normal aside from a single diminutive polyp at the rectosigmoid junction.  The above-mentioned polyp was cold biopsied/removed.  Retroflexed views revealed no abnormalities. .  Withdrawal time=8 minutes 0 seconds.  The scope was withdrawn and the procedure completed. COMPLICATIONS: There were no immediate complications.  ENDOSCOPIC IMPRESSION: Anal canal hemorrhoid. Single rectosigmoid polyp?"removed  as described above. Right-sided colonic diverticulosis as described  RECOMMENDATIONS: Follow up on pathology. Begin Benefiber 2 teaspoons twice daily. Course of Anusol suppositories 1 twice a day -  10 days. See EGD report.  eSigned:  R. Garfield Cornea, MD Rosalita Chessman [facility] May 29, 2014 11:29 AM   cc:  CPT CODES: ICD CODES:  The ICD and CPT codes recommended by this software are interpretations from the data that the clinical staff has captured with the software.  The verification of the translation of this report to the ICD and CPT codes and modifiers is the sole responsibility of the health care institution and practicing physician where this report was generated.  [employer]. will not be held responsible for the validity of the ICD and CPT codes included on this report.  AMA assumes no liability for data contained or not contained herein. CPT is a Designer, television/film set of the [employer].

## 2014-05-14 NOTE — Progress Notes (Signed)
cc'ed to pcp °

## 2014-05-14 NOTE — Anesthesia Procedure Notes (Signed)
Procedure Name: MAC Date/Time: 05/14/2014 10:33 AM Performed by: Andree Elk, Carlin Attridge A Pre-anesthesia Checklist: Patient identified, Timeout performed, Emergency Drugs available, Suction available and Patient being monitored Oxygen Delivery Method: Simple face mask

## 2014-05-14 NOTE — H&P (View-Only) (Signed)
Primary Care Physician:  Alonza Bogus, MD Primary Gastroenterologist:  Dr. Gala Romney   Chief Complaint  Patient presents with  . Colonoscopy    HPI:   Timothy Walker is a 49 y.o. male presenting today as a self-referral secondary to dysphagia and rectal bleeding.   States he has been seen by Dr. Gala Romney in the remote past. Notes multiple dilations in the past. Operative reports not available at time of visit but have been requested.   Notes recurrent esophageal dysphagia, feels like it is getting worse. Will drink liquids and feels like swallowing a golf ball. Feels it as it goes all the way down.   Hx of MI about a year ago. Difficulty taking meds sometimes. Solid food dysphagia. After eating, notes abdominal bloating 20-30 minutes later. Seems like he isn't burping or able to burp. Feels like he has to prime a burp. Notes odynophagia. Stomach gurgles/bubbles all the time. States he has gained a lot of weight over Christmas holidays. No N/V.   Paper hematochezia noted. No prior colonoscopy. Painless hematochezia. No constipation. No significant bowel habit changes. Protonix BID.   Past Medical History  Diagnosis Date  . Coronary artery disease     a. 12/2012 Inf STEMI/Cath/PCI: LM nl, LAD nl, D1 50ost, D2 min irregs, D3 small, LCX 80-74m OM1/2/3 min irregs, RI 20p, RCA 50p/100d (3.5x18 Xience DEs),PDA/PL nl, EF 579%- complicated by VF/CGS/IABP/VDRF  . Hypertension   . Ischemic cardiomyopathy     a. 12/2012 Echo: EF 45-50%, basal inf, inflat, mid inf HK, mildly reduced RV fxn.  . Tobacco abuse   . Marijuana abuse   . Obstructive sleep apnea   . GERD (gastroesophageal reflux disease)   . Hiatal hernia   . MI (myocardial infarction) 2014  . COPD (chronic obstructive pulmonary disease)     Past Surgical History  Procedure Laterality Date  . Dental surgery    . Nasal septum surgery    . Coronary angioplasty  01/13/2013    STENT TO RCA BY DR COOPER  . Left heart  catheterization with coronary angiogram N/A 01/13/2013    Procedure: LEFT HEART CATHETERIZATION WITH CORONARY ANGIOGRAM;  Surgeon: MWellington Hampshire MD;  [facility];  Service: Cardiovascular;  Laterality: N/A;  . Percutaneous coronary stent intervention (pci-s)  01/13/2013    Procedure: PERCUTANEOUS CORONARY STENT INTERVENTION (PCI-S);  Surgeon: MWellington Hampshire MD;  [facility];  Service: Cardiovascular;;    Current Outpatient Prescriptions  Medication Sig Dispense Refill  . acetaminophen (TYLENOL) 500 MG tablet Take 500 mg by mouth daily as needed for mild pain, moderate pain or headache.     .Marland Kitchenaspirin 81 MG tablet Take 81 mg by mouth every morning.     .Marland Kitchenatorvastatin (LIPITOR) 80 MG tablet Take 1 tablet (80 mg total) by mouth daily at 6 PM. 30 tablet 6  . citalopram (CELEXA) 20 MG tablet Take 1 tablet by mouth daily.    .Marland Kitchenlosartan (COZAAR) 100 MG tablet Take 1 tablet (100 mg total) by mouth daily. 30 tablet 6  . metoprolol (LOPRESSOR) 100 MG tablet Take 1 tablet (100 mg total) by mouth 2 (two) times daily. (Patient taking differently: Take 150 mg by mouth 2 (two) times daily. ) 60 tablet 6  . metoprolol (LOPRESSOR) 50 MG tablet Take 1 tablet (50 mg total) by mouth 2 (two) times daily. 60 tablet 6  . nitroGLYCERIN (NITROSTAT) 0.4 MG SL tablet Place 1 tablet (0.4  mg total) under the tongue every 5 (five) minutes as needed for chest pain. 25 tablet 3  . pantoprazole (PROTONIX) 40 MG tablet Take 40 mg by mouth 2 (two) times daily before a meal.    . peg 3350 powder (MOVIPREP) 100 G SOLR Take 1 kit (200 g total) by mouth once. 1 kit 0   No current facility-administered medications for this visit.    Allergies as of 04/24/2014  . (No Known Allergies)    Family History  Problem Relation Age of Onset  . Colon cancer Neg Hx     History   Social History  . Marital Status: Divorced    Spouse Name: N/A    Number of Children: N/A  . Years of Education: N/A    Occupational History  . Not on file.   Social History Main Topics  . Smoking status: Current Every Day Smoker -- 1.50 packs/day for 31 years    Types: Cigarettes    Start date: 11/21/1981  . Smokeless tobacco: Never Used     Comment: stopped on 01/13/13 but restarted after going home  . Alcohol Use: 1.2 oz/week    1 Cans of beer, 1 Shots of liquor per week     Comment: social  . Drug Use: Yes    Special: Marijuana  . Sexual Activity: Not on file   Other Topics Concern  . Not on file   Social History Narrative    Review of Systems: As mentioned in HPI.   Physical Exam: BP 148/100 mmHg  Pulse 64  Temp(Src) 98.4 F (36.9 C) (Oral)  Ht 5' 11"  (1.803 m)  Wt 182 lb 12.8 oz (82.918 kg)  BMI 25.51 kg/m2 General:   Alert and oriented. Pleasant and cooperative. Well-nourished and well-developed.  Head:  Normocephalic and atraumatic. Eyes:  Without icterus, sclera clear and conjunctiva pink.  Ears:  Normal auditory acuity. Nose:  No deformity, discharge,  or lesions. Mouth:  No deformity or lesions, oral mucosa pink.  Lungs:  Clear to auscultation bilaterally. No wheezes, rales, or rhonchi. No distress.  Heart:  S1, S2 present without murmurs appreciated.  Abdomen:  +BS, soft, non-tender and non-distended. No rebound or guarding. Liver margin palpable several fingerbreadths below right costal margin Rectal:  Deferred  Msk:  Symmetrical without gross deformities. Normal posture.. Extremities:  Without edema. Neurologic:  Alert and  oriented x4;  grossly normal neurologically. Skin:  Intact without significant lesions or rashes. Psych:  Alert and cooperative. Normal mood and affect.

## 2014-05-14 NOTE — Anesthesia Postprocedure Evaluation (Signed)
  Anesthesia Post-op Note  Patient: Timothy Walker  Procedure(s) Performed: Procedure(s): ESOPHAGOGASTRODUODENOSCOPY (EGD) WITH PROPOFOL (N/A) ESOPHAGEAL DILATION 56 FRENCH MALONEY (N/A) COLONOSCOPY WITH PROPOFOL; IN CECUM AT 1105; WITHDRAWAL TIME 8 MINUTES (N/A) POLYPECTOMY  Patient Location: PACU  Anesthesia Type:MAC  Level of Consciousness: awake, alert , oriented and patient cooperative  Airway and Oxygen Therapy: Patient Spontanous Breathing  Post-op Pain: none  Post-op Assessment: Post-op Vital signs reviewed, Patient's Cardiovascular Status Stable, Respiratory Function Stable, Patent Airway and No signs of Nausea or vomiting  Post-op Vital Signs: Reviewed and stable  Last Vitals:  Filed Vitals:   05/14/14 1030  BP: 124/83  Pulse:   Temp:   Resp: 27    Complications: No apparent anesthesia complications

## 2014-05-14 NOTE — Interval H&P Note (Signed)
History and Physical Interval Note:  05/14/2014 9:05 AM  Timothy Walker  has presented today for surgery, with the diagnosis of dysphagia, hematomchezia  The various methods of treatment have been discussed with the patient and family. After consideration of risks, benefits and other options for treatment, the patient has consented to  Procedure(s) with comments: COLONOSCOPY WITH PROPOFOL (N/A) - 1100 - pt to arrive at 8:30 for labs ESOPHAGOGASTRODUODENOSCOPY (EGD) WITH PROPOFOL (N/A) ESOPHAGEAL DILATION (N/A) as a surgical intervention .  The patient's history has been reviewed, patient examined, no change in status, stable for surgery.  I have reviewed the patient's chart and labs.  Questions were answered to the patient's satisfaction.     Robert Rourk  No change. No antiplatelet or anticoagulation therapy. EGD with esophageal dilation as appropriate and colonoscopy per plan.  The risks, benefits, limitations, imponderables and alternatives regarding both EGD and colonoscopy have been reviewed with the patient. Questions have been answered. All parties agreeable.

## 2014-05-14 NOTE — Discharge Instructions (Addendum)
Colonoscopy Discharge Instructions  Read the instructions outlined below and refer to this sheet in the next few weeks. These discharge instructions provide you with general information on caring for yourself after you leave the hospital. Your doctor may also give you specific instructions. While your treatment has been planned according to the most current medical practices available, unavoidable complications occasionally occur. If you have any problems or questions after discharge, call Dr. Gala Romney at [phone]. ACTIVITY  You may resume your regular activity, but move at a slower pace for the next 24 hours.   Take frequent rest periods for the next 24 hours.   Walking will help get rid of the air and reduce the bloated feeling in your belly (abdomen).   No driving for 24 hours (because of the medicine (anesthesia) used during the test).    Do not sign any important legal documents or operate any machinery for 24 hours (because of the anesthesia used during the test).  NUTRITION  Drink plenty of fluids.   You may resume your normal diet as instructed by your doctor.   Begin with a light meal and progress to your normal diet. Heavy or fried foods are harder to digest and may make you feel sick to your stomach (nauseated).   Avoid alcoholic beverages for 24 hours or as instructed.  MEDICATIONS  You may resume your normal medications unless your doctor tells you otherwise.  WHAT YOU CAN EXPECT TODAY  Some feelings of bloating in the abdomen.   Passage of more gas than usual.   Spotting of blood in your stool or on the toilet paper.  IF YOU HAD POLYPS REMOVED DURING THE COLONOSCOPY:  No aspirin products for 7 days or as instructed.   No alcohol for 7 days or as instructed.   Eat a soft diet for the next 24 hours.  FINDING OUT THE RESULTS OF YOUR TEST Not all test results are available during your visit. If your test results are not back during the visit, make an appointment  with your caregiver to find out the results. Do not assume everything is normal if you have not heard from your caregiver or the medical facility. It is important for you to follow up on all of your test results.  SEEK IMMEDIATE MEDICAL ATTENTION IF:  You have more than a spotting of blood in your stool.   Your belly is swollen (abdominal distention).   You are nauseated or vomiting.   You have a temperature over 101.  You have abdominal pain or discomfort that is severe or gets worse throughout the day.    EGD Discharge instructions Please read the instructions outlined below and refer to this sheet in the next few weeks. These discharge instructions provide you with general information on caring for yourself after you leave the hospital. Your doctor may also give you specific instructions. While your treatment has been planned according to the most current medical practices available, unavoidable complications occasionally occur. If you have any problems or questions after discharge, please call your doctor. ACTIVITY  You may resume your regular activity but move at a slower pace for the next 24 hours.   Take frequent rest periods for the next 24 hours.   Walking will help expel (get rid of) the air and reduce the bloated feeling in your abdomen.   No driving for 24 hours (because of the anesthesia (medicine) used during the test).   You may shower.   Do not sign any  important legal documents or operate any machinery for 24 hours (because of the anesthesia used during the test).  NUTRITION  Drink plenty of fluids.   You may resume your normal diet.   Begin with a light meal and progress to your normal diet.   Avoid alcoholic beverages for 24 hours or as instructed by your caregiver.  MEDICATIONS  You may resume your normal medications unless your caregiver tells you otherwise.  WHAT YOU CAN EXPECT TODAY  You may experience abdominal discomfort such as a feeling of  fullness or gas pains.  FOLLOW-UP  Your doctor will discuss the results of your test with you.  SEEK IMMEDIATE MEDICAL ATTENTION IF ANY OF THE FOLLOWING OCCUR:  Excessive nausea (feeling sick to your stomach) and/or vomiting.   Severe abdominal pain and distention (swelling).   Trouble swallowing.   Temperature over 101 F (37.8 C).   Rectal bleeding or vomiting of blood.    Continue Protonix 40 mg twice daily  Diverticulosis information provided  Polyp information provided  Benefiber 2 teaspoons twice daily  Anusol HC suppository  One per rectum twice daily for 10 days  Further recommendations to follow pending review of pathology report.  Office visit with [place] in 6 weeks  Diverticulosis Diverticulosis is the condition that develops when small pouches (diverticula) form in the wall of your colon. Your colon, or large intestine, is where water is absorbed and stool is formed. The pouches form when the inside layer of your colon pushes through weak spots in the outer layers of your colon. CAUSES  No one knows exactly what causes diverticulosis. RISK FACTORS  Being older than 69. Your risk for this condition increases with age. Diverticulosis is rare in people younger than 40 years. By age 36, almost everyone has it.  Eating a low-fiber diet.  Being frequently constipated.  Being overweight.  Not getting enough exercise.  Smoking.  Taking over-the-counter pain medicines, like aspirin and ibuprofen. SYMPTOMS  Most people with diverticulosis do not have symptoms. DIAGNOSIS  Because diverticulosis often has no symptoms, health care providers often discover the condition during an exam for other colon problems. In many cases, a health care provider will diagnose diverticulosis while using a flexible scope to examine the colon (colonoscopy). TREATMENT  If you have never developed an infection related to diverticulosis, you may not need treatment. If you have had an  infection before, treatment may include:  Eating more fruits, vegetables, and grains.  Taking a fiber supplement.  Taking a live bacteria supplement (probiotic).  Taking medicine to relax your colon. HOME CARE INSTRUCTIONS   Drink at least 6-8 glasses of water each day to prevent constipation.  Try not to strain when you have a bowel movement.  Keep all follow-up appointments. If you have had an infection before:  Increase the fiber in your diet as directed by your health care provider or dietitian.  Take a dietary fiber supplement if your health care provider approves.  Only take medicines as directed by your health care provider. SEEK MEDICAL CARE IF:   You have abdominal pain.  You have bloating.  You have cramps.  You have not gone to the bathroom in 3 days. SEEK IMMEDIATE MEDICAL CARE IF:   Your pain gets worse.  Yourbloating becomes very bad.  You have a fever or chills, and your symptoms suddenly get worse.  You begin vomiting.  You have bowel movements that are bloody or black. MAKE SURE YOU:  Understand these instructions.  Will watch your condition.  Will get help right away if you are not doing well or get worse. Document Released: 12/09/2003 Document Revised: 03/18/2013 Document Reviewed: 02/05/2013 [facility] Patient Information 2015 [place], [place]. This information is not intended to replace advice given to you by your health care provider. Make sure you discuss any questions you have with your health care provider. Colon Polyps Polyps are lumps of extra tissue growing inside the body. Polyps can grow in the large intestine (colon). Most colon polyps are noncancerous (benign). However, some colon polyps can become cancerous over time. Polyps that are larger than a pea may be harmful. To be safe, caregivers remove and test all polyps. CAUSES  Polyps form when mutations in the genes cause your cells to grow and divide even though no more tissue is  needed. RISK FACTORS There are a number of risk factors that can increase your chances of getting colon polyps. They include:  Being older than 50 years.  Family history of colon polyps or colon cancer.  Long-term colon diseases, such as colitis or Crohn disease.  Being overweight.  Smoking.  Being inactive.  Drinking too much alcohol. SYMPTOMS  Most small polyps do not cause symptoms. If symptoms are present, they may include:  Blood in the stool. The stool may look dark red or black.  Constipation or diarrhea that lasts longer than 1 week. DIAGNOSIS People often do not know they have polyps until their caregiver finds them during a regular checkup. Your caregiver can use 4 tests to check for polyps:  Digital rectal exam. The caregiver wears gloves and feels inside the rectum. This test would find polyps only in the rectum.  Barium enema. The caregiver puts a liquid called barium into your rectum before taking X-rays of your colon. Barium makes your colon look white. Polyps are dark, so they are easy to see in the X-ray pictures.  Sigmoidoscopy. A thin, flexible tube (sigmoidoscope) is placed into your rectum. The sigmoidoscope has a light and tiny camera in it. The caregiver uses the sigmoidoscope to look at the last third of your colon.  Colonoscopy. This test is like sigmoidoscopy, but the caregiver looks at the entire colon. This is the most common method for finding and removing polyps. TREATMENT  Any polyps will be removed during a sigmoidoscopy or colonoscopy. The polyps are then tested for cancer. PREVENTION  To help lower your risk of getting more colon polyps:  Eat plenty of fruits and vegetables. Avoid eating fatty foods.  Do not smoke.  Avoid drinking alcohol.  Exercise every day.  Lose weight if recommended by your caregiver.  Eat plenty of calcium and folate. Foods that are rich in calcium include milk, cheese, and broccoli. Foods that are rich in folate  include chickpeas, kidney beans, and spinach. HOME CARE INSTRUCTIONS Keep all follow-up appointments as directed by your caregiver. You may need periodic exams to check for polyps. SEEK MEDICAL CARE IF: You notice bleeding during a bowel movement. Document Released: 12/08/2003 Document Revised: 06/05/2011 Document Reviewed: 05/23/2011 [facility] Patient Information 2015 [place], [place]. This information is not intended to replace advice given to you by your health care provider. Make sure you discuss any questions you have with your health care provider.

## 2014-05-14 NOTE — Anesthesia Preprocedure Evaluation (Signed)
Anesthesia Evaluation  Patient identified by MRN, date of birth, ID band Patient awake    Airway Mallampati: II  TM Distance: >3 FB     Dental  (+) Teeth Intact   Pulmonary sleep apnea , COPDCurrent Smoker,  breath sounds clear to auscultation        Cardiovascular hypertension, Pt. on medications and Pt. on home beta blockers + CAD, + Past MI and + Cardiac Stents + dysrhythmias Ventricular Tachycardia and Ventricular Fibrillation Rhythm:Regular Rate:Normal     Neuro/Psych    GI/Hepatic hiatal hernia, GERD-  Medicated,(+)     substance abuse  alcohol use and marijuana use,   Endo/Other    Renal/GU      Musculoskeletal   Abdominal   Peds  Hematology   Anesthesia Other Findings   Reproductive/Obstetrics                             Anesthesia Physical Anesthesia Plan  ASA: III  Anesthesia Plan: MAC   Post-op Pain Management:    Induction: Intravenous  Airway Management Planned: Simple Face Mask  Additional Equipment:   Intra-op Plan:   Post-operative Plan:   Informed Consent: I have reviewed the patients History and Physical, chart, labs and discussed the procedure including the risks, benefits and alternatives for the proposed anesthesia with the patient or authorized representative who has indicated his/her understanding and acceptance.     Plan Discussed with:   Anesthesia Plan Comments:         Anesthesia Quick Evaluation

## 2014-05-14 NOTE — Transfer of Care (Signed)
Immediate Anesthesia Transfer of Care Note  Patient: Timothy Walker  Procedure(s) Performed: Procedure(s): ESOPHAGOGASTRODUODENOSCOPY (EGD) WITH PROPOFOL (N/A) ESOPHAGEAL DILATION 56 FRENCH MALONEY (N/A) COLONOSCOPY WITH PROPOFOL; IN CECUM AT 1105; WITHDRAWAL TIME 8 MINUTES (N/A) POLYPECTOMY  Patient Location: PACU  Anesthesia Type:MAC  Level of Consciousness: awake, alert , oriented and patient cooperative  Airway & Oxygen Therapy: Patient Spontanous Breathing and Patient connected to nasal cannula oxygen  Post-op Assessment: Report given to RN and Post -op Vital signs reviewed and stable  Post vital signs: Reviewed and stable  Last Vitals:  Filed Vitals:   05/14/14 1030  BP: 124/83  Pulse:   Temp:   Resp: 27    Complications: No apparent anesthesia complications

## 2014-05-15 ENCOUNTER — Encounter (HOSPITAL_COMMUNITY): Payer: Self-pay | Admitting: Internal Medicine

## 2014-05-16 ENCOUNTER — Other Ambulatory Visit: Payer: Self-pay | Admitting: Cardiology

## 2014-05-18 ENCOUNTER — Encounter: Payer: Self-pay | Admitting: Internal Medicine

## 2014-06-25 ENCOUNTER — Telehealth: Payer: Self-pay | Admitting: Internal Medicine

## 2014-06-25 ENCOUNTER — Encounter: Payer: Self-pay | Admitting: Gastroenterology

## 2014-06-25 ENCOUNTER — Ambulatory Visit: Payer: Managed Care, Other (non HMO) | Admitting: Gastroenterology

## 2014-06-25 NOTE — Telephone Encounter (Signed)
PATIENT WAS A NO SHOW 06/25/14 AND LETTER WAS SENT

## 2014-09-04 IMAGING — CR DG CHEST 1V PORT
1 series · 1 of 1 positions shown · non-contrast
Comparison: 01/13/2013

CLINICAL DATA: Assess ET tube.

EXAM:
PORTABLE CHEST - 1 VIEW

[AP]
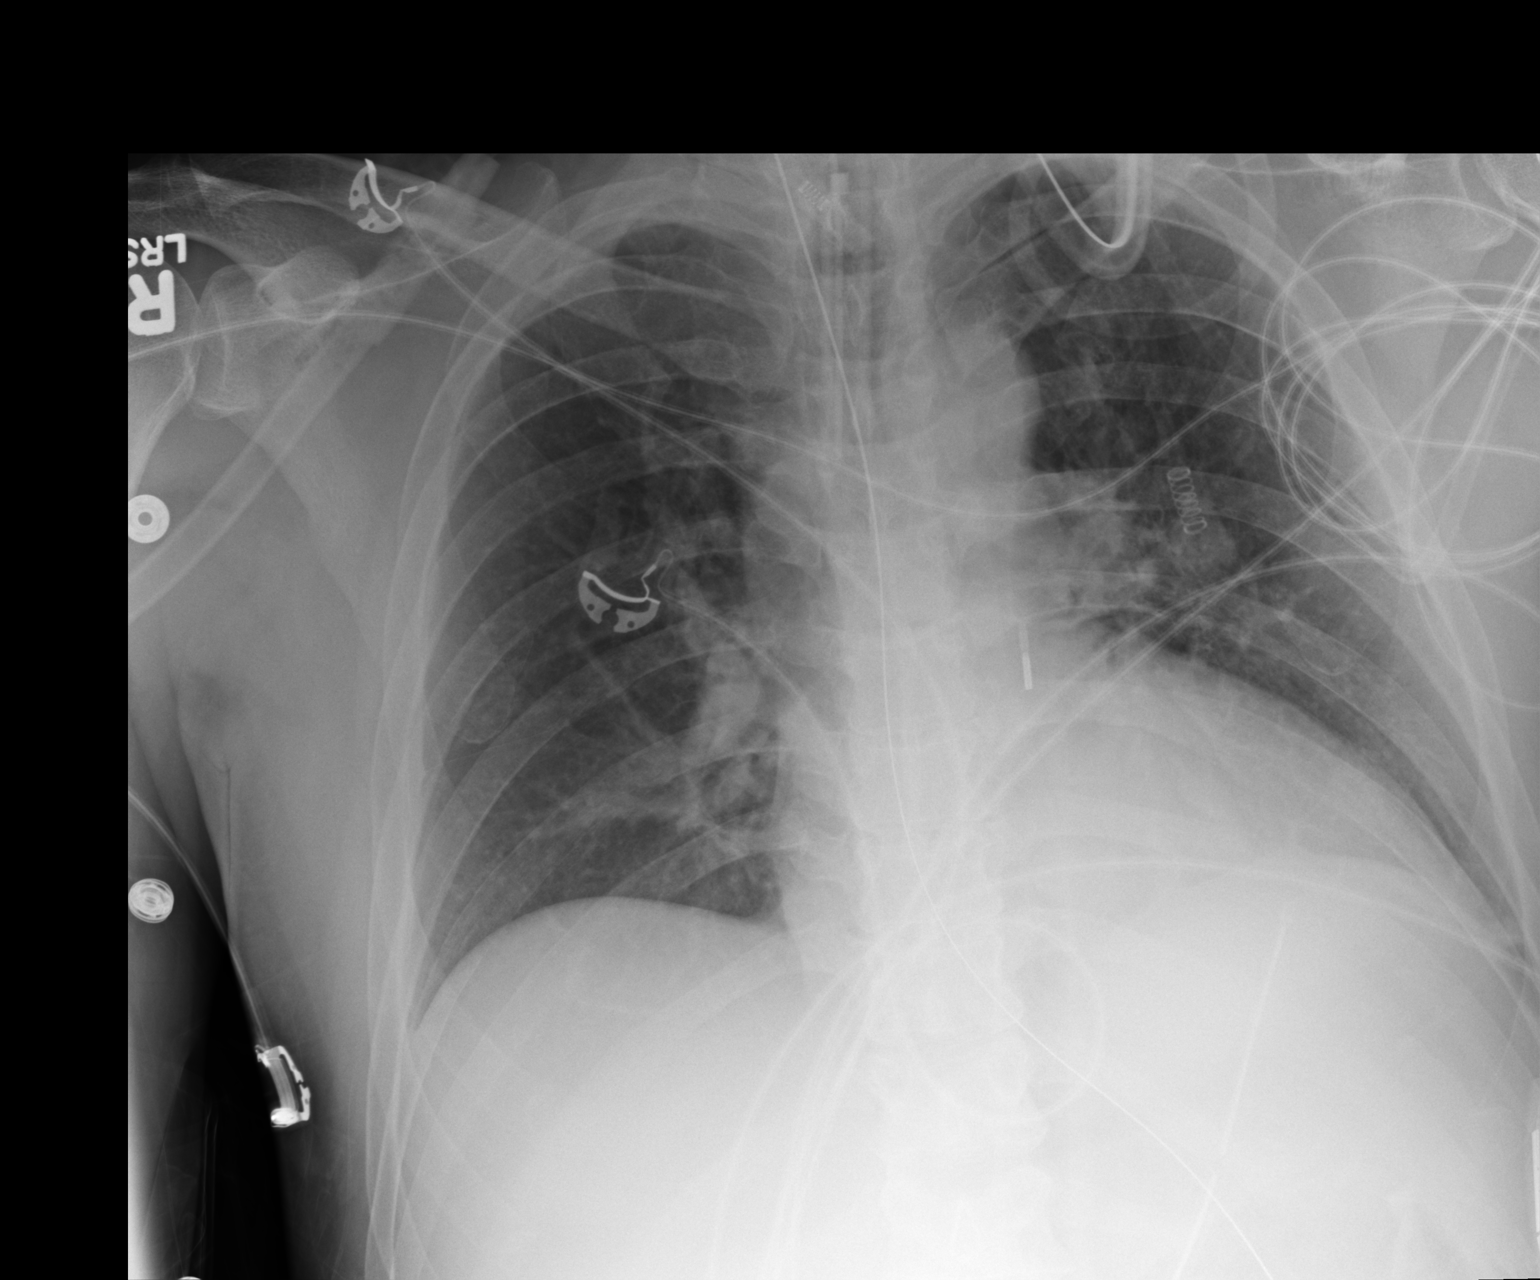

[1 of 1 positions shown; findings below may reference images not displayed]

FINDINGS: Support devices including endotracheal tube and intra-aortic balloon
pump remain in place, unchanged. The intra-aortic balloon pump tip
is within the mid descending thoracic aorta. Mild cardiomegaly. No
confluent airspace opacities or effusions.
IMPRESSION: Stable support devices. Stable exam.

## 2014-09-07 ENCOUNTER — Other Ambulatory Visit: Payer: Self-pay | Admitting: *Deleted

## 2014-09-07 MED ORDER — PANTOPRAZOLE SODIUM 40 MG PO TBEC: 40.0000 mg | DELAYED_RELEASE_TABLET | Freq: Two times a day (BID) | ORAL | Status: DC

## 2014-09-18 ENCOUNTER — Encounter: Payer: Self-pay | Admitting: Cardiology

## 2014-09-18 ENCOUNTER — Encounter: Payer: Managed Care, Other (non HMO) | Admitting: Cardiology

## 2014-09-18 NOTE — Progress Notes (Signed)
   ERROR No Show     Arnoldo Lenis, M.D.

## 2014-10-21 ENCOUNTER — Other Ambulatory Visit: Payer: Self-pay | Admitting: *Deleted

## 2014-10-21 ENCOUNTER — Other Ambulatory Visit: Payer: Self-pay

## 2014-10-21 MED ORDER — LOSARTAN POTASSIUM 100 MG PO TABS: 100.0000 mg | ORAL_TABLET | Freq: Every day | ORAL | Status: DC

## 2014-11-10 ENCOUNTER — Other Ambulatory Visit: Payer: Self-pay | Admitting: *Deleted

## 2014-11-10 MED ORDER — METOPROLOL TARTRATE 50 MG PO TABS: 50.0000 mg | ORAL_TABLET | Freq: Two times a day (BID) | ORAL | Status: DC

## 2014-11-23 ENCOUNTER — Other Ambulatory Visit: Payer: Self-pay | Admitting: *Deleted

## 2014-11-23 MED ORDER — LOSARTAN POTASSIUM 100 MG PO TABS: 100.0000 mg | ORAL_TABLET | Freq: Every day | ORAL | Status: DC

## 2014-11-23 MED ORDER — ATORVASTATIN CALCIUM 80 MG PO TABS: 80.0000 mg | ORAL_TABLET | Freq: Every day | ORAL | Status: DC

## 2014-11-24 ENCOUNTER — Other Ambulatory Visit: Payer: Self-pay | Admitting: Cardiology

## 2014-11-26 ENCOUNTER — Other Ambulatory Visit: Payer: Self-pay | Admitting: *Deleted

## 2014-11-26 MED ORDER — ATORVASTATIN CALCIUM 80 MG PO TABS: 80.0000 mg | ORAL_TABLET | Freq: Every day | ORAL | Status: DC

## 2014-11-28 ENCOUNTER — Encounter (HOSPITAL_COMMUNITY): Payer: Self-pay | Admitting: Emergency Medicine

## 2014-11-28 ENCOUNTER — Emergency Department (HOSPITAL_COMMUNITY)
Admission: EM | Admit: 2014-11-28 | Discharge: 2014-11-29 | Disposition: A | Discharge status: Home / Self Care | Payer: Managed Care, Other (non HMO) | Attending: Emergency Medicine | Admitting: Emergency Medicine

## 2014-11-28 DIAGNOSIS — Z9889 Other specified postprocedural states: Secondary | ICD-10-CM | POA: Diagnosis not present

## 2014-11-28 DIAGNOSIS — G510 Bell's palsy: Secondary | ICD-10-CM | POA: Diagnosis not present

## 2014-11-28 DIAGNOSIS — I10 Essential (primary) hypertension: Secondary | ICD-10-CM | POA: Diagnosis not present

## 2014-11-28 DIAGNOSIS — Z7982 Long term (current) use of aspirin: Secondary | ICD-10-CM | POA: Insufficient documentation

## 2014-11-28 DIAGNOSIS — Z79899 Other long term (current) drug therapy: Secondary | ICD-10-CM | POA: Insufficient documentation

## 2014-11-28 DIAGNOSIS — I2510 Atherosclerotic heart disease of native coronary artery without angina pectoris: Secondary | ICD-10-CM | POA: Diagnosis not present

## 2014-11-28 DIAGNOSIS — K219 Gastro-esophageal reflux disease without esophagitis: Secondary | ICD-10-CM | POA: Insufficient documentation

## 2014-11-28 DIAGNOSIS — R29810 Facial weakness: Secondary | ICD-10-CM | POA: Diagnosis present

## 2014-11-28 DIAGNOSIS — I252 Old myocardial infarction: Secondary | ICD-10-CM | POA: Diagnosis not present

## 2014-11-28 DIAGNOSIS — Z720 Tobacco use: Secondary | ICD-10-CM | POA: Insufficient documentation

## 2014-11-28 DIAGNOSIS — J449 Chronic obstructive pulmonary disease, unspecified: Secondary | ICD-10-CM | POA: Diagnosis not present

## 2014-11-28 NOTE — ED Notes (Signed)
Patient states he noticed when he woke up this morning at 11:00 a.m. that he had facial droop and numbness to left side of face. Patient also states he is having difficulty closing left eye. Patient denies any weakness. Hand grips are strong and equal bilaterally, patient ambulatory with no difficulty ambulating. Patient also complains of headache to left side of head.

## 2014-11-29 ENCOUNTER — Emergency Department (HOSPITAL_COMMUNITY): Payer: Managed Care, Other (non HMO)

## 2014-11-29 MED ORDER — ARTIFICIAL TEARS OP OINT: TOPICAL_OINTMENT | Freq: Three times a day (TID) | OPHTHALMIC | Status: DC

## 2014-11-29 MED ORDER — PREDNISONE 50 MG PO TABS: ORAL_TABLET | ORAL | Status: DC

## 2014-11-29 MED ORDER — PREDNISONE 50 MG PO TABS
60.0000 mg | ORAL_TABLET | Freq: Once | ORAL | Status: AC
  Administered 2014-11-29: 60 mg via ORAL
  Filled 2014-11-29 (×2): qty 1

## 2014-11-29 NOTE — Discharge Instructions (Signed)
Bell's Palsy °Bell's palsy is a condition in which the muscles on one side of the face cannot move (paralysis). This is because the nerves in the face are paralyzed. It is most often thought to be caused by a virus. The virus causes swelling of the nerve that controls movement on one side of the face. The nerve travels through a tight space surrounded by bone. When the nerve swells, it can be compressed by the bone. This results in damage to the protective covering around the nerve. This damage interferes with how the nerve communicates with the muscles of the face. As a result, it can cause weakness or paralysis of the facial muscles.  °Injury (trauma), tumor, and surgery may cause Bell's palsy, but most of the time the cause is unknown. It is a relatively common condition. It starts suddenly (abrupt onset) with the paralysis usually ending within 2 days. Bell's palsy is not dangerous. But because the eye does not close properly, you may need care to keep the eye from getting dry. This can include splinting (to keep the eye shut) or moistening with artificial tears. Bell's palsy very seldom occurs on both sides of the face at the same time. °SYMPTOMS  °· Eyebrow sagging. °· Drooping of the eyelid and corner of the mouth. °· Inability to close one eye. °· Loss of taste on the front of the tongue. °· Sensitivity to loud noises. °TREATMENT  °The treatment is usually non-surgical. If the patient is seen within the first 24 to 48 hours, a short course of steroids may be prescribed, in an attempt to shorten the length of the condition. Antiviral medicines may also be used with the steroids, but it is unclear if they are helpful.  °You will need to protect your eye, if you cannot close it. The cornea (clear covering over your eye) will become dry and can be damaged. Artificial tears can be used to keep your eye moist. Glasses or an eye patch should be worn to protect your eye. °PROGNOSIS  °Recovery is variable, ranging  from days to months. Although the problem usually goes away completely (about 80% of cases resolve), predicting the outcome is impossible. Most people improve within 3 weeks of when the symptoms began. Improvement may continue for 3 to 6 months. A small number of people have moderate to severe weakness that is permanent.  °HOME CARE INSTRUCTIONS  °· If your caregiver prescribed medication to reduce swelling in the nerve, use as directed. Do not stop taking the medication unless directed by your caregiver. °· Use moisturizing eye drops as needed to prevent drying of your eye, as directed by your caregiver. °· Protect your eye, as directed by your caregiver. °· Use facial massage and exercises, as directed by your caregiver. °· Perform your normal activities, and get your normal rest. °SEEK IMMEDIATE MEDICAL CARE IF:  °· There is pain, redness or irritation in the eye. °· You or your child has an oral temperature above 102° F (38.9° C), not controlled by medicine. °MAKE SURE YOU:  °· Understand these instructions. °· Will watch your condition. °· Will get help right away if you are not doing well or get worse. °Document Released: 03/13/2005 Document Revised: 06/05/2011 Document Reviewed: 06/20/2013 °Exit[employer]® Patient Information ©2015 [employer]. This information is not intended to replace advice given to you by your health care provider. Make sure you discuss any questions you have with your health care provider. ° °

## 2014-11-29 NOTE — ED Provider Notes (Signed)
CSN: [id number]     Arrival date & time 11/28/14  2331 History   First MD Initiated Contact with Patient 11/28/14 2355     Chief Complaint  Patient presents with  . Facial Droop  . Numbness     Patient is a 49 y.o. male presenting with weakness. The history is provided by the patient and the spouse.  Weakness This is a new problem. The current episode started 12 to 24 hours ago. The problem occurs constantly. The problem has been gradually worsening. Associated symptoms include headaches. Pertinent negatives include no chest pain, no abdominal pain and no shortness of breath. Nothing aggravates the symptoms. Nothing relieves the symptoms.  pt reports onset of left facial droop/numbness 12 hrs ago He reports mild HA He reports mild numbness to left face No arm/leg weakness No dizziness No fever No hearing changes No visual changes but reports difficulty closing his left eye   He has never had this before He denies h/o CVA  Past Medical History  Diagnosis Date  . Coronary artery disease     a. 12/2012 Inf STEMI/Cath/PCI: LM nl, LAD nl, D1 50ost, D2 min irregs, D3 small, LCX 80-23m OM1/2/3 min irregs, RI 20p, RCA 50p/100d (3.5x18 Xience DEs),PDA/PL nl, EF 576%- complicated by VF/CGS/IABP/VDRF  . Hypertension   . Ischemic cardiomyopathy     a. 12/2012 Echo: EF 45-50%, basal inf, inflat, mid inf HK, mildly reduced RV fxn.  . Tobacco abuse   . Marijuana abuse   . Obstructive sleep apnea   . GERD (gastroesophageal reflux disease)   . Hiatal hernia   . MI (myocardial infarction) 2014  . COPD (chronic obstructive pulmonary disease)    Past Surgical History  Procedure Laterality Date  . Dental surgery    . Nasal septum surgery    . Coronary angioplasty  01/13/2013    STENT TO RCA BY DR COOPER  . Left heart catheterization with coronary angiogram N/A 01/13/2013    Procedure: LEFT HEART CATHETERIZATION WITH CORONARY ANGIOGRAM;  Surgeon: MWellington Hampshire MD;  [facility];   Service: Cardiovascular;  Laterality: N/A;  . Percutaneous coronary stent intervention (pci-s)  01/13/2013    Procedure: PERCUTANEOUS CORONARY STENT INTERVENTION (PCI-S);  Surgeon: MWellington Hampshire MD;  [facility];  Service: Cardiovascular;;  . Esophagogastroduodenoscopy (egd) with propofol N/A 05/14/2014    RMR: Subtly abnormal distal esophagus as described status post passage of Maloney dilator. Status post esophageal biopsy. Small hiatal hernia.   . Esophageal dilation N/A 05/14/2014    Procedure: ESOPHAGEAL DILATION [facility]  Surgeon: RDaneil Dolin MD;  Location: [facility];  Service: Endoscopy;  Laterality: N/A;  . Colonoscopy with propofol N/A 05/14/2014    RMR: Anal canal hemorrhoid. Single rectosigmoid polyp removed as described above. Right sided colonic divertitulosis as described above.   . Polypectomy  05/14/2014    Procedure: POLYPECTOMY;  Surgeon: RDaneil Dolin MD;  Location: [facility];  Service: Endoscopy;;   Family History  Problem Relation Age of Onset  . Colon cancer Neg Hx    Social History  Substance Use Topics  . Smoking status: Current Every Day Smoker -- 1.50 packs/day for 31 years    Types: Cigarettes    Start date: 11/21/1981  . Smokeless tobacco: Never Used     Comment: stopped on 01/13/13 but restarted after going home  . Alcohol Use: 1.2 oz/week    1 Cans of beer, 1 Shots of liquor per week  Comment: social    Review of Systems  Constitutional: Negative for fever.  Respiratory: Negative for shortness of breath.   Cardiovascular: Negative for chest pain.  Gastrointestinal: Negative for abdominal pain.  Neurological: Positive for weakness and headaches.  All other systems reviewed and are negative.     Allergies  Review of patient's allergies indicates no known allergies.  Home Medications   Prior to Admission medications   Medication Sig Start Date End Date Taking? Authorizing Provider  acetaminophen (TYLENOL) 500 MG tablet  Take 500 mg by mouth daily as needed for mild pain, moderate pain or headache.     Historical Provider, MD  amLODipine (NORVASC) 5 MG tablet Take 1 tablet (5 mg total) by mouth daily. 05/01/14   Arnoldo Lenis, MD  aspirin 81 MG tablet Take 81 mg by mouth every morning.     Historical Provider, MD  atorvastatin (LIPITOR) 80 MG tablet Take 1 tablet (80 mg total) by mouth daily at 6 PM. 11/26/14   Arnoldo Lenis, MD  Blood Pressure Monitoring (BP MONITOR-STETHOSCOPE) KIT 1 Device by Does not apply route daily. 05/01/14   Arnoldo Lenis, MD  citalopram (CELEXA) 20 MG tablet Take 1 tablet by mouth daily. 07/11/13   Historical Provider, MD  losartan (COZAAR) 100 MG tablet Take 1 tablet (100 mg total) by mouth daily. 11/23/14   Arnoldo Lenis, MD  metoprolol (LOPRESSOR) 100 MG tablet TAKE 1 TABLET (100 MG TOTAL) BY MOUTH 2 (TWO) TIMES DAILY. 05/18/14   Arnoldo Lenis, MD  metoprolol (LOPRESSOR) 50 MG tablet Take 1 tablet (50 mg total) by mouth 2 (two) times daily. WILL TAKE IN ADDITION TO THE 100MG TABLET 11/10/14   Arnoldo Lenis, MD  nitroGLYCERIN (NITROSTAT) 0.4 MG SL tablet Place 1 tablet (0.4 mg total) under the tongue every 5 (five) minutes as needed for chest pain. 04/03/14   Arnoldo Lenis, MD  pantoprazole (PROTONIX) 40 MG tablet Take 1 tablet (40 mg total) by mouth 2 (two) times daily before a meal. 09/07/14   Arnoldo Lenis, MD   BP 164/110 mmHg  Pulse 72  Temp(Src) 98.2 F (36.8 C) (Oral)  Resp 20  Ht 5' 11"  (1.803 m)  Wt 185 lb (83.915 kg)  BMI 25.81 kg/m2  SpO2 100% Physical Exam CONSTITUTIONAL: Well developed/well nourished HEAD: Normocephalic/atraumatic EYES: EOMI/PERRL, no nystagmus,no ptosis ENMT: Mucous membranes moist, cerumen in right ear canal, left TM clear/intact NECK: supple no meningeal signs, no bruits CV: S1/S2 noted, no murmurs/rubs/gallops noted LUNGS: Lungs are clear to auscultation bilaterally, no apparent distress ABDOMEN: soft, nontender, no rebound  or guarding GU:no cva tenderness NEURO:Awake/alert,left facial droop, mild left facial numbness, he has difficulty closing left eye and has weakness to left forehead All other cranial nerves intact  no arm or leg drift is noted Equal 5/5 strength with shoulder abduction, elbow flex/extension, wrist flex/extension in upper extremities and equal hand grips bilaterally Equal 5/5 strength with hip flexion,knee flex/extension, foot dorsi/plantar flexion Gait normal without ataxia No past pointing Sensation to light touch intact in all extremities EXTREMITIES: pulses normal, full ROM SKIN: warm, color normal PSYCH: no abnormalities of mood noted  ED Course  Procedures  1:04 AM Suspicious for bell's palsy but given h/o multiple medical problems and also with HA, will proceed with CT head 2:02 AM Pt without any change He is well appearing I feel he is safe for d/c home I have low suspicion for acute CVA Will start prednisone Recommend eye  patch and artificial tears Discussed strict return precautions  Imaging Review Ct Head Wo Contrast  11/29/2014   CLINICAL DATA:  Facial droop and left side facial paresthesias.  EXAM: CT HEAD WITHOUT CONTRAST  TECHNIQUE: Contiguous axial images were obtained from the base of the skull through the vertex without intravenous contrast.  COMPARISON:  None.  FINDINGS: There is no intracranial hemorrhage, mass or evidence of acute infarction. There is no extra-axial fluid collection. Gray matter and white matter appear normal. Cerebral volume is normal for age. Brainstem and posterior fossa are unremarkable. The CSF spaces appear normal.  The bony structures are intact. The visible portions of the paranasal sinuses are clear.  IMPRESSION: Normal brain   Electronically Signed   By: Andreas Newport M.D.   On: 11/29/2014 01:35      EKG Interpretation   Date/Time:  Saturday November 28 2014 23:42:19 EDT Ventricular Rate:  70 PR Interval:  163 QRS Duration:  117 QT Interval:  387 QTC Calculation: 418 R Axis:   47 Text Interpretation:  Sinus rhythm Nonspecific intraventricular conduction  delay No significant change since last tracing Confirmed by Christy Gentles  MD,  Murvin Gift ([id number]) on 11/28/2014 11:50:29 PM     Medications  predniSONE (DELTASONE) tablet 60 mg (60 mg Oral Given 11/29/14 0201)    MDM   Final diagnoses:  Bell's palsy    Nursing notes including past medical history and social history reviewed and considered in documentation     Ripley Fraise, MD 11/29/14 [phone]

## 2014-11-29 NOTE — ED Notes (Signed)
Patient verbalizes understanding of discharge instructions, prescription medications, home care and follow up care. Patient ambulatory out of department at this time with spouse. 

## 2014-12-08 ENCOUNTER — Other Ambulatory Visit: Payer: Self-pay | Admitting: *Deleted

## 2014-12-08 ENCOUNTER — Other Ambulatory Visit: Payer: Self-pay

## 2014-12-08 MED ORDER — METOPROLOL TARTRATE 50 MG PO TABS: 50.0000 mg | ORAL_TABLET | Freq: Two times a day (BID) | ORAL | Status: DC

## 2014-12-10 ENCOUNTER — Other Ambulatory Visit: Payer: Self-pay

## 2014-12-10 ENCOUNTER — Other Ambulatory Visit: Payer: Self-pay | Admitting: *Deleted

## 2014-12-10 MED ORDER — PANTOPRAZOLE SODIUM 40 MG PO TBEC: 40.0000 mg | DELAYED_RELEASE_TABLET | Freq: Two times a day (BID) | ORAL | Status: DC

## 2014-12-11 ENCOUNTER — Encounter: Payer: Self-pay | Admitting: Cardiology

## 2014-12-11 ENCOUNTER — Ambulatory Visit (INDEPENDENT_AMBULATORY_CARE_PROVIDER_SITE_OTHER): Payer: Managed Care, Other (non HMO) | Admitting: Cardiology

## 2014-12-11 VITALS — BP 135/91 | HR 71 | Ht 71.0 in | Wt 182.4 lb

## 2014-12-11 DIAGNOSIS — I519 Heart disease, unspecified: Secondary | ICD-10-CM | POA: Diagnosis not present

## 2014-12-11 DIAGNOSIS — I2510 Atherosclerotic heart disease of native coronary artery without angina pectoris: Secondary | ICD-10-CM | POA: Diagnosis not present

## 2014-12-11 DIAGNOSIS — R7309 Other abnormal glucose: Secondary | ICD-10-CM | POA: Diagnosis not present

## 2014-12-11 DIAGNOSIS — E785 Hyperlipidemia, unspecified: Secondary | ICD-10-CM

## 2014-12-11 DIAGNOSIS — I10 Essential (primary) hypertension: Secondary | ICD-10-CM | POA: Diagnosis not present

## 2014-12-11 MED ORDER — METOPROLOL TARTRATE 100 MG PO TABS: ORAL_TABLET | ORAL | Status: DC

## 2014-12-11 MED ORDER — METOPROLOL TARTRATE 50 MG PO TABS: 50.0000 mg | ORAL_TABLET | Freq: Two times a day (BID) | ORAL | Status: DC

## 2014-12-11 NOTE — Progress Notes (Signed)
Patient ID: Timothy Walker, male   DOB: 1966/01/28, 49 y.o.   MRN: [id number]     Clinical Summary Timothy Walker is a 49 y.o.male seen today for follow up of the following medical problems.   1. CAD  - admit to [facility] 01/13/13 to 01/17/13 with STEMI, DES to RCA.  - patient presented in cardiogenic shock, suffered VT/VF arrest requiring multiple defibrillations and IV amiodarone. Following PCI required balloon pump and vasopressors and temporary transvenous pacing.  - Echo LVEF 45-50%, inferior hypokinesis, normal diastolic function   - since last visit he denies any chest pain. No SOB or DOE. - notes some mild palpitations that are overall not frequent  2. HTN  - does not check regularly - compliant with meds  3. Hyperlipidemia  - compliant with statin    Past Medical History  Diagnosis Date  . Coronary artery disease     a. 12/2012 Inf STEMI/Cath/PCI: LM nl, LAD nl, D1 50ost, D2 min irregs, D3 small, LCX 80-34m OM1/2/3 min irregs, RI 20p, RCA 50p/100d (3.5x18 Xience DEs),PDA/PL nl, EF 502%- complicated by VF/CGS/IABP/VDRF  . Hypertension   . Ischemic cardiomyopathy     a. 12/2012 Echo: EF 45-50%, basal inf, inflat, mid inf HK, mildly reduced RV fxn.  . Tobacco abuse   . Marijuana abuse   . Obstructive sleep apnea   . GERD (gastroesophageal reflux disease)   . Hiatal hernia   . MI (myocardial infarction) 2014  . COPD (chronic obstructive pulmonary disease)      No Known Allergies   Current Outpatient Prescriptions  Medication Sig Dispense Refill  . acetaminophen (TYLENOL) 500 MG tablet Take 500 mg by mouth daily as needed for mild pain, moderate pain or headache.     .Marland KitchenamLODipine (NORVASC) 5 MG tablet Take 1 tablet (5 mg total) by mouth daily. 180 tablet 3  . artificial tears (LACRILUBE) OINT ophthalmic ointment Place into the left eye 3 (three) times daily. 3.5 g 0  . aspirin 81 MG tablet Take 81 mg by mouth every morning.     .Marland Kitchenatorvastatin (LIPITOR) 80 MG  tablet Take 1 tablet (80 mg total) by mouth daily at 6 PM. 3 tablet 0  . Blood Pressure Monitoring (BP MONITOR-STETHOSCOPE) KIT 1 Device by Does not apply route daily. 1 each 0  . citalopram (CELEXA) 20 MG tablet Take 1 tablet by mouth daily.    .Marland Kitchenlosartan (COZAAR) 100 MG tablet Take 1 tablet (100 mg total) by mouth daily. 7 tablet 0  . metoprolol (LOPRESSOR) 100 MG tablet TAKE 1 TABLET (100 MG TOTAL) BY MOUTH 2 (TWO) TIMES DAILY. 60 tablet 6  . metoprolol (LOPRESSOR) 50 MG tablet Take 1 tablet (50 mg total) by mouth 2 (two) times daily. WILL TAKE IN ADDITION TO THE 100MG TABLET 14 tablet 0  . nitroGLYCERIN (NITROSTAT) 0.4 MG SL tablet Place 1 tablet (0.4 mg total) under the tongue every 5 (five) minutes as needed for chest pain. 25 tablet 3  . pantoprazole (PROTONIX) 40 MG tablet Take 1 tablet (40 mg total) by mouth 2 (two) times daily before a meal. 60 tablet 1  . predniSONE (DELTASONE) 50 MG tablet One tablet PO daily for 6 days 6 tablet 0   No current facility-administered medications for this visit.     Past Surgical History  Procedure Laterality Date  . Dental surgery    . Nasal septum surgery    . Coronary angioplasty  01/13/2013    STENT TO  RCA BY DR COOPER  . Left heart catheterization with coronary angiogram N/A 01/13/2013    Procedure: LEFT HEART CATHETERIZATION WITH CORONARY ANGIOGRAM;  Surgeon: Wellington Hampshire, MD;  Location: [facility];  Service: Cardiovascular;  Laterality: N/A;  . Percutaneous coronary stent intervention (pci-s)  01/13/2013    Procedure: PERCUTANEOUS CORONARY STENT INTERVENTION (PCI-S);  Surgeon: Wellington Hampshire, MD;  [facility];  Service: Cardiovascular;;  . Esophagogastroduodenoscopy (egd) with propofol N/A 05/14/2014    RMR: Subtly abnormal distal esophagus as described status post passage of Maloney dilator. Status post esophageal biopsy. Small hiatal hernia.   . Esophageal dilation N/A 05/14/2014    Procedure: ESOPHAGEAL DILATION [place];  Surgeon: Daneil Dolin, MD;  Location: [facility];  Service: Endoscopy;  Laterality: N/A;  . Colonoscopy with propofol N/A 05/14/2014    RMR: Anal canal hemorrhoid. Single rectosigmoid polyp removed as described above. Right sided colonic divertitulosis as described above.   . Polypectomy  05/14/2014    Procedure: POLYPECTOMY;  Surgeon: Daneil Dolin, MD;  Location: [facility];  Service: Endoscopy;;     No Known Allergies    Family History  Problem Relation Age of Onset  . Colon cancer Neg Hx      Social History Timothy Walker reports that he has been smoking Cigarettes.  He started smoking about 33 years ago. He has a 46.5 pack-year smoking history. He has never used smokeless tobacco. Timothy Walker reports that he drinks about 1.2 oz of alcohol per week.   Review of Systems CONSTITUTIONAL: No weight loss, fever, chills, weakness or fatigue.  HEENT: Eyes: No visual loss, blurred vision, double vision or yellow sclerae.No hearing loss, sneezing, congestion, runny nose or sore throat.  SKIN: No rash or itching.  CARDIOVASCULAR: per HPI RESPIRATORY: No shortness of breath, cough or sputum.  GASTROINTESTINAL: No anorexia, nausea, vomiting or diarrhea. No abdominal pain or blood.  GENITOURINARY: No burning on urination, no polyuria NEUROLOGICAL: No headache, dizziness, syncope, paralysis, ataxia, numbness or tingling in the extremities. No change in bowel or bladder control.  MUSCULOSKELETAL: No muscle, back pain, joint pain or stiffness.  LYMPHATICS: No enlarged nodes. No history of splenectomy.  PSYCHIATRIC: No history of depression or anxiety.  ENDOCRINOLOGIC: No reports of sweating, cold or heat intolerance. No polyuria or polydipsia.  Marland Kitchen   Physical Examination Filed Vitals:   12/11/14 1630  BP: 135/91  Pulse: 71   Filed Vitals:   12/11/14 1630  Height: 5' 11" (1.803 m)  Weight: 182 lb 6.4 oz (82.736 kg)    Gen: resting comfortably, no acute distress HEENT: no scleral  icterus, pupils equal round and reactive, no palptable cervical adenopathy,  CV: RRR, no m/r/g, no jvd Resp: Clear to auscultation bilaterally GI: abdomen is soft, non-tender, non-distended, normal bowel sounds, no hepatosplenomegaly MSK: extremities are warm, no edema.  Skin: warm, no rash Neuro:  no focal deficits Psych: appropriate affect   Diagnostic Studies Cath 12/2012 Hemodynamics:  AO: 119/82 mmHg  LV: 119/15 mmHg  LVEDP: 28 mmHg  Coronary angiography:  Coronary dominance: right   Left Main: normal   Left Anterior Descending (LAD): normal in size with minor irregularities throughout its course but no evidence of obstructive disease.   1st diagonal (D1): small in size with 50% ostial stenosis.   2nd diagonal (D2): small in size with minor irregularities.   3rd diagonal (D3): very small in size.   Circumflex (LCx): normal in size and nondominant. There is an  80-90% mid stenosis at the origin of OM 2.   1st obtuse marginal: small in size with minor irregularities.   2nd obtuse marginal: normal in size with no significant disease.   3rd obtuse marginal: medium in size with no significant disease.   Ramus Intermedius: normal in size with 20% proximal stenosis.   Right Coronary Artery: large in size and dominant. There is 50% proximal stenosis. The vessel is occluded distally with large thrombus burden.   Posterior descending artery: normal   Posterior AV segment: normal   Posterolateral branchs: normal Left ventriculography: Left ventricular systolic function is normal , LVEF is estimated at 55 %, there is no significant mitral regurgitation . mild inferior wall hypokinesis  PCI Data:  Vessel - distal RCA/Segment - 3  Percent Stenosis (pre) 100%  TIMI-flow 0  Stent 3.5 x 18 mm Xience expedition drug-eluting stent  Percent Stenosis (post) 0%  TIMI-flow (post) 3  Final Conclusions:  1. Refractory ventricular fibrillation and  cardiogenic shock due to inferior ST elevation myocardial infarction. Occluded distal right coronary artery. There is significant bifurcation disease in the mid left circumflex supplying an overall small to medium sized territory.  2. Profound hypotension on presentation with subsequent improvement with revascularization and intra-aortic balloon pump placement.  3. Normal LV systolic function with moderately elevated left ventricular end-diastolic pressure.  4. Successful PCI and drug-eluting stent placement to the right coronary artery.   01/13/13 Echo:  LVEF 45-50%, inferior hypokinesis, normal diastolic function   82/50/53 14 day event monitor: no significant arrhythmias, palpitations correlate with sinus rhythm, occasional PVCs.   02/2014 PFTs + obstruction, + COPD       Assessment and Plan  1. CAD  - 97/6734 STEMI complicated by cardiogenic shock and VT/VF arrest, s/p PCI to RCA. LVEF 45-50% - no current symptoms. Continue current meds  2. HTN  - at goal, continue current meds  3. Hyperlipidemia  - repeat lipids - continue statin   F/u 6 months. Order annual labs   Arnoldo Lenis, M.D.

## 2014-12-11 NOTE — Patient Instructions (Signed)
Your physician wants you to follow-up in: 6 months with Dr. Bryna Colander will receive a reminder letter in the mail two months in advance. If you don't receive a letter, please call our office to schedule the follow-up appointment.  Your physician recommends that you continue on your current medications as directed. Please refer to the Current Medication list given to you today.  WE HAVE REFILLED LOPRESSOR 50 MG AND 100 MG   Your physician recommends that you return for lab work CBC.CMP.MAG.LIPIDS.HGBA1C.TSH -PLEASE FAST 6 HOURS PRIOR TO LAB WORK  Thank you for choosing [facility]!!

## 2014-12-14 ENCOUNTER — Other Ambulatory Visit: Payer: Self-pay

## 2014-12-15 ENCOUNTER — Other Ambulatory Visit: Payer: Self-pay | Admitting: Cardiology

## 2014-12-15 MED ORDER — ATORVASTATIN CALCIUM 80 MG PO TABS: 80.0000 mg | ORAL_TABLET | Freq: Every day | ORAL | Status: DC

## 2014-12-15 MED ORDER — LOSARTAN POTASSIUM 100 MG PO TABS: 100.0000 mg | ORAL_TABLET | Freq: Every day | ORAL | Status: DC

## 2014-12-15 NOTE — Telephone Encounter (Signed)
Needs refill on Losartan 100mg   &  Atorvastatin 80 mh [facility]) states that they have sent several request to office.

## 2014-12-18 ENCOUNTER — Other Ambulatory Visit: Payer: Self-pay

## 2014-12-21 ENCOUNTER — Other Ambulatory Visit: Payer: Self-pay | Admitting: *Deleted

## 2014-12-21 MED ORDER — ATORVASTATIN CALCIUM 80 MG PO TABS: 80.0000 mg | ORAL_TABLET | Freq: Every day | ORAL | Status: DC

## 2015-01-11 ENCOUNTER — Other Ambulatory Visit: Payer: Self-pay

## 2015-03-23 ENCOUNTER — Other Ambulatory Visit: Payer: Self-pay

## 2015-03-23 MED ORDER — PANTOPRAZOLE SODIUM 40 MG PO TBEC: 40.0000 mg | DELAYED_RELEASE_TABLET | Freq: Two times a day (BID) | ORAL | Status: DC

## 2015-03-23 NOTE — Telephone Encounter (Signed)
SENT IN RX 

## 2015-04-21 ENCOUNTER — Other Ambulatory Visit: Payer: Self-pay | Admitting: Cardiology

## 2015-05-24 ENCOUNTER — Other Ambulatory Visit: Payer: Self-pay | Admitting: *Deleted

## 2015-05-24 MED ORDER — AMLODIPINE BESYLATE 5 MG PO TABS: 5.0000 mg | ORAL_TABLET | Freq: Every day | ORAL | Status: DC

## 2015-07-31 ENCOUNTER — Other Ambulatory Visit: Payer: Self-pay | Admitting: Cardiology

## 2015-08-02 ENCOUNTER — Other Ambulatory Visit: Payer: Self-pay | Admitting: *Deleted

## 2015-08-02 MED ORDER — LOSARTAN POTASSIUM 100 MG PO TABS: 100.0000 mg | ORAL_TABLET | Freq: Every day | ORAL | Status: DC

## 2015-08-30 ENCOUNTER — Other Ambulatory Visit: Payer: Self-pay | Admitting: *Deleted

## 2015-08-30 MED ORDER — METOPROLOL TARTRATE 50 MG PO TABS: ORAL_TABLET | ORAL | Status: DC

## 2015-09-06 ENCOUNTER — Other Ambulatory Visit: Payer: Self-pay | Admitting: Cardiology

## 2015-10-01 ENCOUNTER — Other Ambulatory Visit: Payer: Self-pay

## 2015-10-01 ENCOUNTER — Other Ambulatory Visit: Payer: Self-pay | Admitting: Cardiology

## 2015-10-01 ENCOUNTER — Ambulatory Visit (INDEPENDENT_AMBULATORY_CARE_PROVIDER_SITE_OTHER): Payer: 59 | Admitting: Gastroenterology

## 2015-10-01 ENCOUNTER — Encounter: Payer: Self-pay | Admitting: Gastroenterology

## 2015-10-01 VITALS — BP 140/91 | HR 81 | Temp 97.9°F | Ht 71.0 in | Wt 186.0 lb

## 2015-10-01 DIAGNOSIS — K649 Unspecified hemorrhoids: Secondary | ICD-10-CM

## 2015-10-01 NOTE — Progress Notes (Signed)
Referring Provider: Sinda Du, MD Primary Care Physician:  Alonza Bogus, MD  Primary GI: Dr. Gala Romney   Chief Complaint  Patient presents with  . Hemorrhoids    HPI:   Timothy Walker is a 50 y.o. male presenting today at the request of Dr. Luan Pulling secondary to hemorrhoids and likely anal condylomas.   States will have spells of issues swallowing liquids but "not as bad in the past". Main problem today is "bottom end". Last EGD in 2016 s/p empiric dilation. Colonoscopy at that time with hyperplastic polyp. Appears he will be due again in 2026.    States he has had hemorrhoids since last time since colonoscopy. Occasional bleeding with wiping, none in toilet. Will have extended stays in the bathroom for a couple hours or more. DENIES constipation, just states he enjoys staying in the bathroom. Developed a small patch of bumps towards the "exit" near his backside, not sure what it was. Prescribed Lotrisone cream twice a day. Worsened the pain. Has a shooting pain in his rectum that goes up through the core of his body at times. Not using anything now. Uses baby powder and worried about it causing cancer.     Past Medical History  Diagnosis Date  . Coronary artery disease     a. 12/2012 Inf STEMI/Cath/PCI: LM nl, LAD nl, D1 50ost, D2 min irregs, D3 small, LCX 80-40m, OM1/2/3 min irregs, RI 20p, RCA 50p/100d (3.5x18 Xience DEs),PDA/PL nl, EF XX123456 - complicated by VF/CGS/IABP/VDRF  . Hypertension   . Ischemic cardiomyopathy     a. 12/2012 Echo: EF 45-50%, basal inf, inflat, mid inf HK, mildly reduced RV fxn.  . Tobacco abuse   . Marijuana abuse   . Obstructive sleep apnea   . GERD (gastroesophageal reflux disease)   . Hiatal hernia   . MI (myocardial infarction) ([place]) 2014  . COPD (chronic obstructive pulmonary disease) [facility])     Past Surgical History  Procedure Laterality Date  . Dental surgery    . Nasal septum surgery    . Coronary angioplasty  01/13/2013    STENT  TO RCA BY DR COOPER  . Left heart catheterization with coronary angiogram N/A 01/13/2013    Procedure: LEFT HEART CATHETERIZATION WITH CORONARY ANGIOGRAM;  Surgeon: Wellington Hampshire, MD;  Location: [facility] CATH LAB;  Service: Cardiovascular;  Laterality: N/A;  . Percutaneous coronary stent intervention (pci-s)  01/13/2013    Procedure: PERCUTANEOUS CORONARY STENT INTERVENTION (PCI-S);  Surgeon: Wellington Hampshire, MD;  Location: [facility] CATH LAB;  Service: Cardiovascular;;  . Esophagogastroduodenoscopy (egd) with propofol N/A 05/14/2014    Dr. Gala Romney: empiric dilation, abnormal esophagus s/p biopsy (normal), small hiatal hernia  . Esophageal dilation N/A 05/14/2014    Procedure: ESOPHAGEAL DILATION [place];  Surgeon: Daneil Dolin, MD;  Location: [facility] ORS;  Service: Endoscopy;  Laterality: N/A;  . Colonoscopy with propofol N/A 05/14/2014    Dr. Gala Romney: anal canal hemorrhoid, rectosigmoid hyerpplastic polyp, right-sided divetriculosis   . Polypectomy  05/14/2014    Procedure: POLYPECTOMY;  Surgeon: Daneil Dolin, MD;  Location: [facility] ORS;  Service: Endoscopy;;    Current Outpatient Prescriptions  Medication Sig Dispense Refill  . acetaminophen (TYLENOL) 500 MG tablet Take 500 mg by mouth daily as needed for mild pain, moderate pain or headache.     Marland Kitchen amLODipine (NORVASC) 5 MG tablet TAKE 1 TABLET (5 MG TOTAL) BY MOUTH DAILY. *MAKE APPT FOR FURTHER REFILLS* 30 tablet 0  . aspirin 81  MG tablet Take 81 mg by mouth every morning.     Marland Kitchen atorvastatin (LIPITOR) 80 MG tablet Take 1 tablet (80 mg total) by mouth daily at 6 PM. 30 tablet 6  . clotrimazole-betamethasone (LOTRISONE) cream Apply 1 application topically 2 (two) times daily.    Marland Kitchen losartan (COZAAR) 100 MG tablet Take 1 tablet (100 mg total) by mouth daily. 30 tablet 2  . metoprolol (LOPRESSOR) 100 MG tablet TAKE 1 TABLET BY MOUTH TWO TIMES DAILY 60 tablet 3  . pantoprazole (PROTONIX) 40 MG tablet TAKE 1 TABLET BY MOUTH TWICE DAILY BEFORE A MEAL 60  tablet 3  . doxycycline (ORACEA) 40 MG capsule Take 40 mg by mouth daily.    . metoprolol (LOPRESSOR) 50 MG tablet TAKE 1 TABLET (50 MG TOTAL) BY MOUTH 2 (TWO) TIMES DAILY. WILL TAKE IN ADDITION TO THE 100MG  TABLET 15 tablet 0  . nitroGLYCERIN (NITROSTAT) 0.4 MG SL tablet Place 1 tablet (0.4 mg total) under the tongue every 5 (five) minutes as needed for chest pain. (Patient not taking: Reported on 10/01/2015) 25 tablet 3   No current facility-administered medications for this visit.    Allergies as of 10/01/2015  . (No Known Allergies)    Family History  Problem Relation Age of Onset  . Colon cancer Neg Hx     Social History   Social History  . Marital Status: Divorced    Spouse Name: N/A  . Number of Children: N/A  . Years of Education: N/A   Social History Main Topics  . Smoking status: Current Every Day Smoker -- 1.50 packs/day for 31 years    Types: Cigarettes    Start date: 11/21/1981  . Smokeless tobacco: Never Used     Comment: 1/2 pack daily  . Alcohol Use: 1.2 oz/week    1 Cans of beer, 1 Shots of liquor per week     Comment: social  . Drug Use: Yes    Special: Marijuana  . Sexual Activity: Yes   Other Topics Concern  . None   Social History Narrative    Review of Systems: As mentioned in HPI   Physical Exam: BP 140/91 mmHg  Pulse 81  Temp(Src) 97.9 F (36.6 C) (Oral)  Ht 5\' 11"  (1.803 m)  Wt 186 lb (84.369 kg)  BMI 25.95 kg/m2 General:   Alert and oriented. No distress noted. Pleasant and cooperative.  Head:  Normocephalic and atraumatic. Eyes:  Conjuctiva clear without scleral icterus. Heart:  S1, S2 present without murmurs, rubs, or gallops. Regular rate and rhythm. Rectal: large external non-thrombosed hemorrhoids, lesions consistent with anal condylomas Abdomen:  +BS, soft, non-tender and non-distended. No rebound or guarding. No HSM or masses noted. Extremities:  Without edema. Neurologic:  Alert and  oriented x4;  grossly normal  neurologically. Psych:  Alert and cooperative. Normal mood and affect.

## 2015-10-01 NOTE — Patient Instructions (Signed)
I have referred you to Dr. Rhoderick Moody for further evaluation.  Please call if you would like the special cream in the meantime.

## 2015-10-04 ENCOUNTER — Other Ambulatory Visit: Payer: Self-pay | Admitting: Cardiology

## 2015-10-07 ENCOUNTER — Encounter: Payer: Self-pay | Admitting: Gastroenterology

## 2015-10-07 NOTE — Progress Notes (Signed)
cc'ed to pcp °

## 2015-10-07 NOTE — Assessment & Plan Note (Signed)
50 year old male with large external hemorrhoids and anal condylomas noted on external exam. He continues to spend extraordinary amounts of time in the bathroom but denies constipation; I have counseled him on avoidance of this, limiting time in the bathroom, and we will refer him to a surgeon as his hemorrhoids are beyond routine banding. He also will need excision and treatment for condylomas. Extensive amount of time spent with patient answering questions. Referral to General Surgery. Patieny declined any creams for supportive measures.

## 2015-10-19 ENCOUNTER — Telehealth: Payer: Self-pay

## 2015-10-19 MED ORDER — NITROGLYCERIN 0.4 % RE OINT: 1.0000 [in_us] | TOPICAL_OINTMENT | Freq: Two times a day (BID) | RECTAL | 1 refills | Status: DC

## 2015-10-19 NOTE — Telephone Encounter (Signed)
Completed.

## 2015-10-19 NOTE — Telephone Encounter (Signed)
Pt came by the office- he has gone to see Dr.Jenkins and was given a cream to put on his "bumps" at his rectum. He is not sure of the name of it. He said he is having severe, intense pain in his rectum and he is worried that he has a fissure. I spoke with Vicente Males, she recommends he try some nitro cream and have an office visit in 4 weeks. He agreed with this plan and [facility]. If its too expensive he will call tomorrow and we will send in rx for nitro ointment at [facility].

## 2015-10-27 ENCOUNTER — Other Ambulatory Visit: Payer: Self-pay | Admitting: Cardiology

## 2015-11-19 ENCOUNTER — Encounter: Payer: Self-pay | Admitting: Gastroenterology

## 2015-11-19 ENCOUNTER — Ambulatory Visit: Payer: 59 | Admitting: Gastroenterology

## 2015-11-19 ENCOUNTER — Telehealth: Payer: Self-pay | Admitting: Gastroenterology

## 2015-11-19 NOTE — Telephone Encounter (Signed)
Letter mailed

## 2015-11-19 NOTE — Telephone Encounter (Signed)
Pt was a no show

## 2015-11-27 ENCOUNTER — Other Ambulatory Visit: Payer: Self-pay | Admitting: Cardiology

## 2015-11-30 ENCOUNTER — Telehealth: Payer: Self-pay | Admitting: Cardiology

## 2015-11-30 MED ORDER — AMLODIPINE BESYLATE 5 MG PO TABS: ORAL_TABLET | ORAL | 0 refills | Status: DC

## 2015-11-30 MED ORDER — METOPROLOL TARTRATE 50 MG PO TABS: ORAL_TABLET | ORAL | 0 refills | Status: DC

## 2015-11-30 NOTE — Telephone Encounter (Signed)
Medication sent to pharmacy  

## 2015-11-30 NOTE — Telephone Encounter (Signed)
Patient walked in to make appt   Needs following medication called into [facility]  metoprolol (LOPRESSOR) 50 MG tablet   metoprolol (LOPRESSOR) 100 MG tablet    amLODipine (NORVASC) 5 MG tablet    Patient has been out of medication and stated he would like called in tonight if possible

## 2015-12-02 ENCOUNTER — Other Ambulatory Visit: Payer: Self-pay | Admitting: *Deleted

## 2015-12-02 MED ORDER — METOPROLOL TARTRATE 50 MG PO TABS: ORAL_TABLET | ORAL | 3 refills | Status: DC

## 2015-12-02 MED ORDER — METOPROLOL TARTRATE 50 MG PO TABS: ORAL_TABLET | ORAL | 1 refills | Status: DC

## 2015-12-26 ENCOUNTER — Other Ambulatory Visit: Payer: Self-pay | Admitting: Cardiology

## 2015-12-28 ENCOUNTER — Other Ambulatory Visit: Payer: Self-pay | Admitting: Cardiology

## 2016-01-04 ENCOUNTER — Other Ambulatory Visit: Payer: Self-pay | Admitting: Cardiology

## 2016-01-05 ENCOUNTER — Other Ambulatory Visit: Payer: Self-pay | Admitting: Cardiology

## 2016-01-14 ENCOUNTER — Other Ambulatory Visit: Payer: Self-pay | Admitting: *Deleted

## 2016-01-14 ENCOUNTER — Encounter: Payer: Self-pay | Admitting: Cardiology

## 2016-01-14 ENCOUNTER — Ambulatory Visit (INDEPENDENT_AMBULATORY_CARE_PROVIDER_SITE_OTHER): Payer: 59 | Admitting: Cardiology

## 2016-01-14 VITALS — BP 158/95 | HR 64 | Ht 71.0 in | Wt 187.0 lb

## 2016-01-14 DIAGNOSIS — E782 Mixed hyperlipidemia: Secondary | ICD-10-CM | POA: Diagnosis not present

## 2016-01-14 DIAGNOSIS — I519 Heart disease, unspecified: Secondary | ICD-10-CM | POA: Diagnosis not present

## 2016-01-14 DIAGNOSIS — Z720 Tobacco use: Secondary | ICD-10-CM | POA: Diagnosis not present

## 2016-01-14 DIAGNOSIS — I10 Essential (primary) hypertension: Secondary | ICD-10-CM

## 2016-01-14 DIAGNOSIS — I2510 Atherosclerotic heart disease of native coronary artery without angina pectoris: Secondary | ICD-10-CM | POA: Diagnosis not present

## 2016-01-14 MED ORDER — SILDENAFIL CITRATE 100 MG PO TABS: 100.0000 mg | ORAL_TABLET | Freq: Every day | ORAL | 11 refills | Status: DC | PRN

## 2016-01-14 MED ORDER — METOPROLOL TARTRATE 50 MG PO TABS: ORAL_TABLET | ORAL | 6 refills | Status: DC

## 2016-01-14 MED ORDER — AMLODIPINE BESYLATE 10 MG PO TABS: 10.0000 mg | ORAL_TABLET | Freq: Every day | ORAL | 3 refills | Status: DC

## 2016-01-14 MED ORDER — METOPROLOL TARTRATE 100 MG PO TABS: 100.0000 mg | ORAL_TABLET | Freq: Two times a day (BID) | ORAL | 6 refills | Status: DC

## 2016-01-14 MED ORDER — VARENICLINE TARTRATE 0.5 MG PO TABS: ORAL_TABLET | ORAL | 0 refills | Status: DC

## 2016-01-14 MED ORDER — ATORVASTATIN CALCIUM 80 MG PO TABS: 80.0000 mg | ORAL_TABLET | Freq: Every day | ORAL | 6 refills | Status: DC

## 2016-01-14 MED ORDER — VARENICLINE TARTRATE 1 MG PO TABS: ORAL_TABLET | ORAL | 0 refills | Status: DC

## 2016-01-14 NOTE — Progress Notes (Signed)
Clinical Summary Mr. Witmer is a 50 y.o.male seen today for follow up of the following medical problems.   1. CAD  - admit to [facility] 01/13/13 to 01/17/13 with STEMI, DES to RCA.  - patient presented in cardiogenic shock, suffered VT/VF arrest requiring multiple defibrillations and IV amiodarone. Following PCI required balloon pump and vasopressors and temporary transvenous pacing.  - Echo LVEF 45-50%, inferior hypokinesis, normal diastolic function   - no significant chest pain. Occasional SOB - compliant with meds  2. HTN  - does not check regularly - compliant with meds  3. Hyperlipidemia  - compliant with statin   4. COPD - abnormal PFTs in 2015 - not interested in inhalers. - nicotine patches had helped some in the past.    SH: works cigarette factor x 6 years Past Medical History:  Diagnosis Date  . COPD (chronic obstructive pulmonary disease) ([place])   . Coronary artery disease    a. 12/2012 Inf STEMI/Cath/PCI: LM nl, LAD nl, D1 50ost, D2 min irregs, D3 small, LCX 80-73m, OM1/2/3 min irregs, RI 20p, RCA 50p/100d (3.5x18 Xience DEs),PDA/PL nl, EF XX123456 - complicated by VF/CGS/IABP/VDRF  . GERD (gastroesophageal reflux disease)   . Hiatal hernia   . Hypertension   . Ischemic cardiomyopathy    a. 12/2012 Echo: EF 45-50%, basal inf, inflat, mid inf HK, mildly reduced RV fxn.  . Marijuana abuse   . MI (myocardial infarction) 2014  . Obstructive sleep apnea   . Tobacco abuse      No Known Allergies   Current Outpatient Prescriptions  Medication Sig Dispense Refill  . acetaminophen (TYLENOL) 500 MG tablet Take 500 mg by mouth daily as needed for mild pain, moderate pain or headache.     Marland Kitchen amLODipine (NORVASC) 5 MG tablet TAKE 1 TABLET (5 MG TOTAL) BY MOUTH DAILY. *MAKE APPT FOR FURTHER REFILLS* 30 tablet 1  . aspirin 81 MG tablet Take 81 mg by mouth every morning.     Marland Kitchen atorvastatin (LIPITOR) 80 MG tablet TAKE 1 TABLET (80 MG TOTAL) BY MOUTH DAILY AT  6 PM. 30 tablet 1  . clotrimazole-betamethasone (LOTRISONE) cream Apply 1 application topically 2 (two) times daily.    Marland Kitchen doxycycline (ORACEA) 40 MG capsule Take 40 mg by mouth daily.    Marland Kitchen losartan (COZAAR) 100 MG tablet TAKE 1 TABLET (100 MG TOTAL) BY MOUTH DAILY. 30 tablet 2  . metoprolol (LOPRESSOR) 100 MG tablet TAKE 1 TABLET BY MOUTH TWO TIMES DAILY 60 tablet 1  . metoprolol (LOPRESSOR) 50 MG tablet TAKE 1 TABLET (50 MG TOTAL) BY MOUTH 2 (TWO) TIMES DAILY. WILL TAKE IN ADDITION TO THE 100MG  TABLET 60 tablet 0  . nitroGLYCERIN (NITROSTAT) 0.4 MG SL tablet Place 1 tablet (0.4 mg total) under the tongue every 5 (five) minutes as needed for chest pain. (Patient not taking: Reported on 10/01/2015) 25 tablet 3  . Nitroglycerin 0.4 % OINT Place 1 inch rectally 2 (two) times daily. Wear gloves, wash hands after applying. Monitor for headache or dizziness. 1 Tube 1  . pantoprazole (PROTONIX) 40 MG tablet TAKE 1 TABLET BY MOUTH TWICE DAILY BEFORE A MEAL 60 tablet 2   No current facility-administered medications for this visit.      Past Surgical History:  Procedure Laterality Date  . COLONOSCOPY WITH PROPOFOL N/A 05/14/2014   Dr. Gala Romney: anal canal hemorrhoid, rectosigmoid hyerpplastic polyp, right-sided divetriculosis   . CORONARY ANGIOPLASTY  01/13/2013   STENT TO RCA BY DR COOPER  .  DENTAL SURGERY    . ESOPHAGEAL DILATION N/A 05/14/2014   Procedure: ESOPHAGEAL DILATION [place];  Surgeon: Daneil Dolin, MD;  Location: [facility]S;  Service: Endoscopy;  Laterality: N/A;  . ESOPHAGOGASTRODUODENOSCOPY (EGD) WITH PROPOFOL N/A 05/14/2014   Dr. Gala Romney: empiric dilation, abnormal esophagus s/p biopsy (normal), small hiatal hernia  . LEFT HEART CATHETERIZATION WITH CORONARY ANGIOGRAM N/A 01/13/2013   Procedure: LEFT HEART CATHETERIZATION WITH CORONARY ANGIOGRAM;  Surgeon: Wellington Hampshire, MD;  Location: [place] CATH LAB;  Service: Cardiovascular;  Laterality: N/A;  . NASAL SEPTUM SURGERY    .  PERCUTANEOUS CORONARY STENT INTERVENTION (PCI-S)  01/13/2013   Procedure: PERCUTANEOUS CORONARY STENT INTERVENTION (PCI-S);  Surgeon: Wellington Hampshire, MD;  Location: [facility] CATH LAB;  Service: Cardiovascular;;  . POLYPECTOMY  05/14/2014   Procedure: POLYPECTOMY;  Surgeon: Daneil Dolin, MD;  Location: [facility];  Service: Endoscopy;;     No Known Allergies    Family History  Problem Relation Age of Onset  . Colon cancer Neg Hx      Social History Mr. Warfel reports that he has been smoking Cigarettes.  He started smoking about 34 years ago. He has a 46.50 pack-year smoking history. He has never used smokeless tobacco. Mr. Henneberger reports that he drinks about 1.2 oz of alcohol per week .   Review of Systems CONSTITUTIONAL: No weight loss, fever, chills, weakness or fatigue.  HEENT: Eyes: No visual loss, blurred vision, double vision or yellow sclerae.No hearing loss, sneezing, congestion, runny nose or sore throat.  SKIN: No rash or itching.  CARDIOVASCULAR: per hpi RESPIRATORY: No shortness of breath, cough or sputum.  GASTROINTESTINAL: No anorexia, nausea, vomiting or diarrhea. No abdominal pain or blood.  GENITOURINARY: No burning on urination, no polyuria NEUROLOGICAL: No headache, dizziness, syncope, paralysis, ataxia, numbness or tingling in the extremities. No change in bowel or bladder control.  MUSCULOSKELETAL: No muscle, back pain, joint pain or stiffness.  LYMPHATICS: No enlarged nodes. No history of splenectomy.  PSYCHIATRIC: No history of depression or anxiety.  ENDOCRINOLOGIC: No reports of sweating, cold or heat intolerance. No polyuria or polydipsia.  Marland Kitchen   Physical Examination Vitals:   01/14/16 1255  BP: (!) 158/95  Pulse: 64   Vitals:   01/14/16 1255  Weight: 187 lb (84.8 kg)  Height: 5\' 11"  (1.803 m)    Gen: resting comfortably, no acute distress HEENT: no scleral icterus, pupils equal round and reactive, no palptable cervical adenopathy,  CV: RRR, no  m/r/g, no jvd Resp: Clear to auscultation bilaterally GI: abdomen is soft, non-tender, non-distended, normal bowel sounds, no hepatosplenomegaly MSK: extremities are warm, no edema.  Skin: warm, no rash Neuro:  no focal deficits Psych: appropriate affect   Diagnostic Studies Cath 12/2012 Hemodynamics:  AO: 119/82 mmHg  LV: 119/15 mmHg  LVEDP: 28 mmHg  Coronary angiography:  Coronary dominance: right   Left Main: normal   Left Anterior Descending (LAD): normal in size with minor irregularities throughout its course but no evidence of obstructive disease.   1st diagonal (D1): small in size with 50% ostial stenosis.   2nd diagonal (D2): small in size with minor irregularities.   3rd diagonal (D3): very small in size.   Circumflex (LCx): normal in size and nondominant. There is an 80-90% mid stenosis at the origin of OM 2.   1st obtuse marginal: small in size with minor irregularities.   2nd obtuse marginal: normal in size with no significant disease.   3rd obtuse marginal: medium  in size with no significant disease.   Ramus Intermedius: normal in size with 20% proximal stenosis.   Right Coronary Artery: large in size and dominant. There is 50% proximal stenosis. The vessel is occluded distally with large thrombus burden.   Posterior descending artery: normal   Posterior AV segment: normal   Posterolateral branchs: normal Left ventriculography: Left ventricular systolic function is normal , LVEF is estimated at 55 %, there is no significant mitral regurgitation . mild inferior wall hypokinesis  PCI Data:  Vessel - distal RCA/Segment - 3  Percent Stenosis (pre) 100%  TIMI-flow 0  Stent 3.5 x 18 mm Xience expedition drug-eluting stent  Percent Stenosis (post) 0%  TIMI-flow (post) 3  Final Conclusions:  1. Refractory ventricular fibrillation and cardiogenic shock due to inferior ST elevation myocardial infarction. Occluded distal right  coronary artery. There is significant bifurcation disease in the mid left circumflex supplying an overall small to medium sized territory.  2. Profound hypotension on presentation with subsequent improvement with revascularization and intra-aortic balloon pump placement.  3. Normal LV systolic function with moderately elevated left ventricular end-diastolic pressure.  4. Successful PCI and drug-eluting stent placement to the right coronary artery.   01/13/13 Echo:  LVEF 45-50%, inferior hypokinesis, normal diastolic function   0000000 14 day event monitor: no significant arrhythmias, palpitations correlate with sinus rhythm, occasional PVCs.   02/2014 PFTs + obstruction, + COPD       Assessment and Plan   1. CAD  - 99991111 STEMI complicated by cardiogenic shock and VT/VF arrest, s/p PCI to RCA. LVEF 45-50% - no current symptoms.  - we will continue current meds - EKG in clinic without ischemic changes 2. HTN  -elevated in clinic. We will increase norvasc to 10mg  daily.   3. Hyperlipidemia  - repeat lipid panel - continue statin  4. Tobacco abuse - will give Rx for chantix  5. Erectile dysfunction - given Rx for viagra. Counseled not to take with his NG.    F/u 1 year     Arnoldo Lenis, M.D., F.A.C.C.

## 2016-01-14 NOTE — Patient Instructions (Signed)
Your physician wants you to follow-up in: [place] DR. BRANCH  You will receive a reminder letter in the mail two months in advance. If you don't receive a letter, please call our office to schedule the follow-up appointment.  Your physician has recommended you make the following change in your medication:   INCREASE AMLODIPINE 10 MG DAILY  START CHANTIX 0.5 MG DAILY FOR 3 DAYS THEN  0.5 MG TWICE DAILY FOR 4 DAYS THEN  1MG  TWICE DAILY FOR 66 WEEKS  Your physician recommends that you return for lab work CBC/BMP/PSA/TSH/MG/HGBA1C/LIPIDS - PLEASE FAST PRIOR TO LAB WORK  Thank you for choosing [facility]!!

## 2016-01-17 ENCOUNTER — Telehealth: Payer: Self-pay | Admitting: Cardiology

## 2016-01-17 NOTE — Telephone Encounter (Signed)
Patient walked in asking that the two inhalers go ahead and be called in.  He decided to go ahead

## 2016-01-17 NOTE — Telephone Encounter (Signed)
Pt requesting inhalers that were discussed with Dr. Harl Bowie to be called in to pharmacy - will forward to Dr. Harl Bowie for which kind

## 2016-01-18 ENCOUNTER — Other Ambulatory Visit: Payer: Self-pay | Admitting: *Deleted

## 2016-01-18 MED ORDER — VARENICLINE TARTRATE 1 MG PO TABS: ORAL_TABLET | ORAL | 0 refills | Status: DC

## 2016-01-18 MED ORDER — ALBUTEROL SULFATE HFA 108 (90 BASE) MCG/ACT IN AERS: 1.0000 | INHALATION_SPRAY | Freq: Four times a day (QID) | RESPIRATORY_TRACT | 2 refills | Status: DC | PRN

## 2016-01-18 MED ORDER — TIOTROPIUM BROMIDE MONOHYDRATE 18 MCG IN CAPS: 18.0000 ug | ORAL_CAPSULE | Freq: Every day | RESPIRATORY_TRACT | 12 refills | Status: DC

## 2016-01-18 NOTE — Telephone Encounter (Signed)
Medication sent to pharmacy  

## 2016-01-18 NOTE — Telephone Encounter (Signed)
Start spririva 62mcg once a day, and albuterol 1-2 puffs q 6 hrs prn SOB   Zandra Abts MD

## 2016-01-20 ENCOUNTER — Encounter: Payer: Self-pay | Admitting: *Deleted

## 2016-01-20 ENCOUNTER — Telehealth: Payer: Self-pay | Admitting: *Deleted

## 2016-01-20 NOTE — Telephone Encounter (Signed)
LM X 2 days - mailed pt letter

## 2016-01-20 NOTE — Telephone Encounter (Signed)
-----   Message from Arnoldo Lenis, MD sent at 01/17/2016  1:04 PM EDT ----- Labs look good  Zandra Abts MD

## 2016-01-21 ENCOUNTER — Telehealth: Payer: Self-pay | Admitting: Cardiology

## 2016-01-21 NOTE — Telephone Encounter (Signed)
Lab results given to patient.

## 2016-01-21 NOTE — Telephone Encounter (Signed)
Patient informed. 

## 2016-01-21 NOTE — Telephone Encounter (Signed)
Mr. Marchant returned call to [facility] about test results.

## 2016-04-16 ENCOUNTER — Other Ambulatory Visit: Payer: Self-pay | Admitting: Cardiology

## 2016-05-12 ENCOUNTER — Other Ambulatory Visit: Payer: Self-pay | Admitting: Cardiology

## 2016-07-19 IMAGING — CT CT HEAD W/O CM
1 series · 16 of 30 positions shown, 20 images · non-contrast
Comparison: None.

CLINICAL DATA: Facial droop and left side facial paresthesias.

EXAM:
CT HEAD WITHOUT CONTRAST
TECHNIQUE: Contiguous axial images were obtained from the base of the skull
through the vertex without intravenous contrast.

[Series 2: headseq 4.8 h37s · axial · 0.49mm/px · z∈[+1046,+1201]mm · 16 of 36 slices shown, 20 images]
[im 2/36  brain]
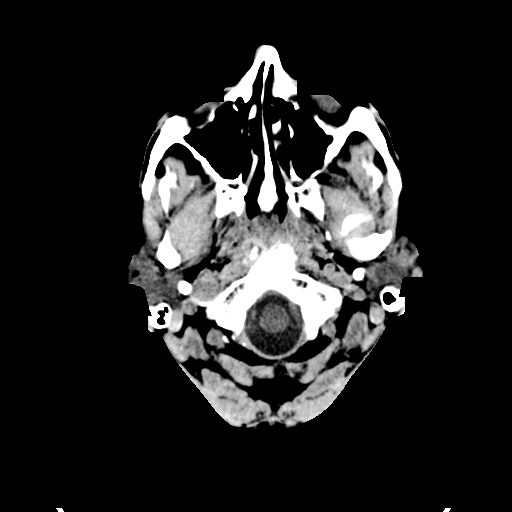
[im 2/36  bone]
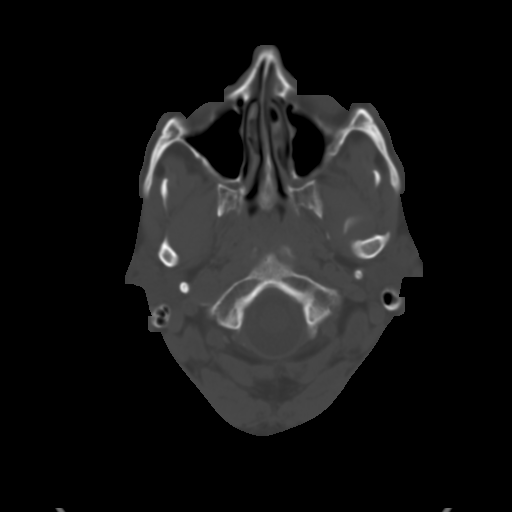
[im 4/36  brain]
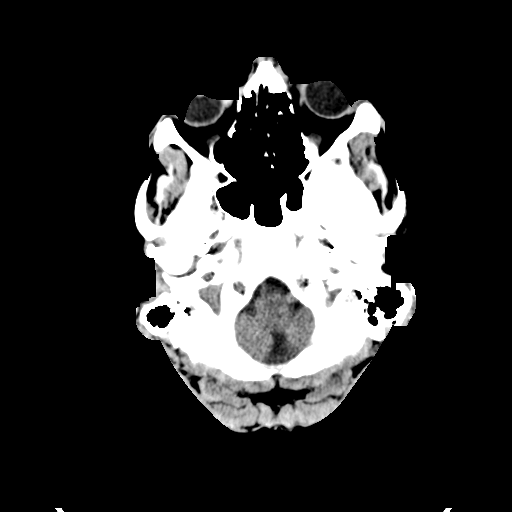
[im 7/36  brain]
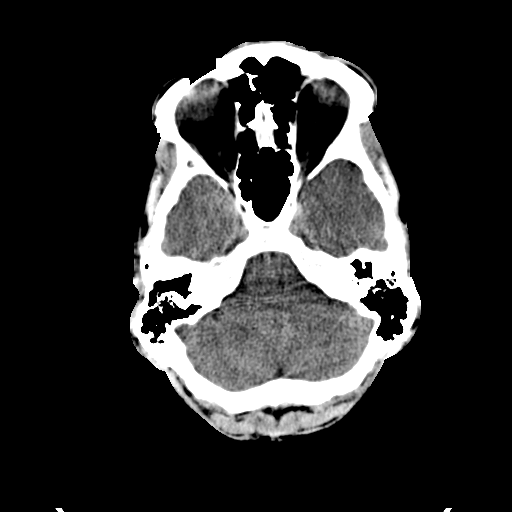
[im 9/36  brain]
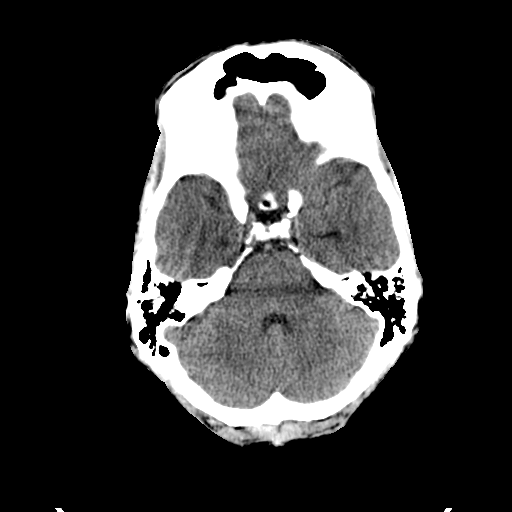
[im 10/36  brain]
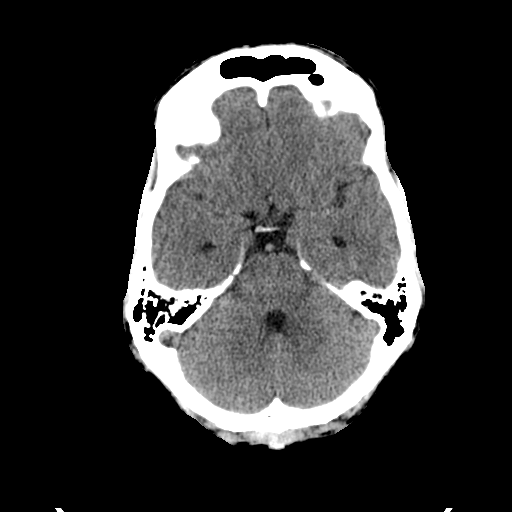
[im 10/36  bone]
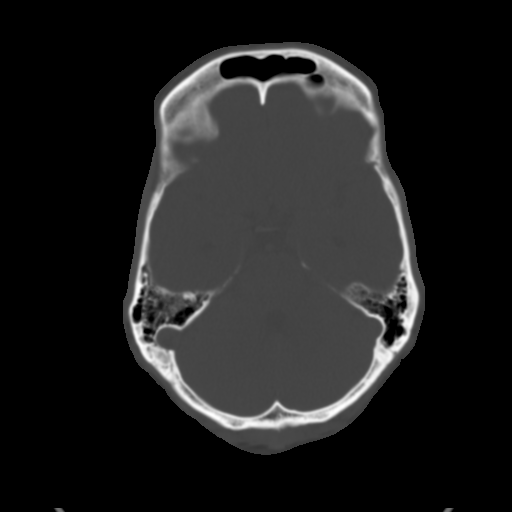
[im 13/36  brain]
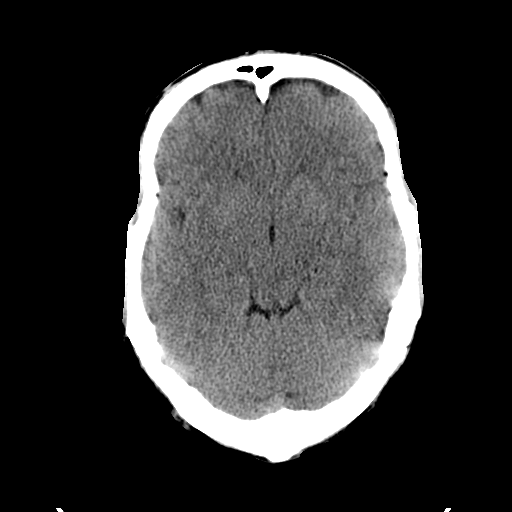
[im 15/36  brain]
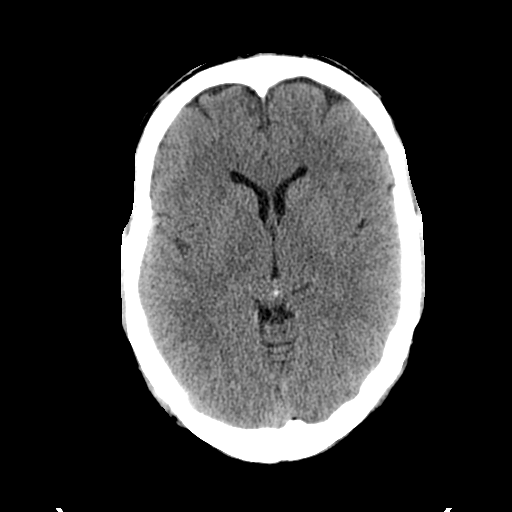
[im 17/36  brain]
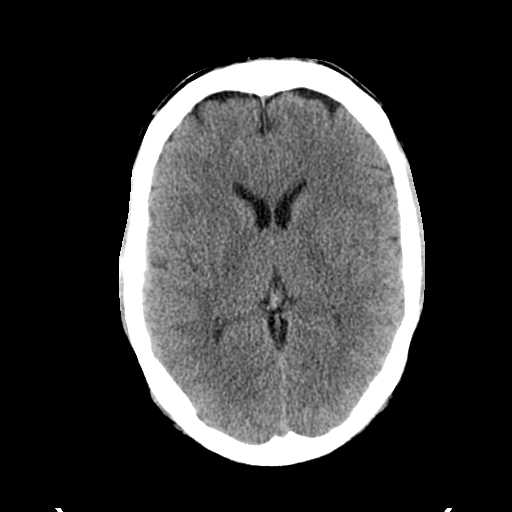
[im 19/36  brain]
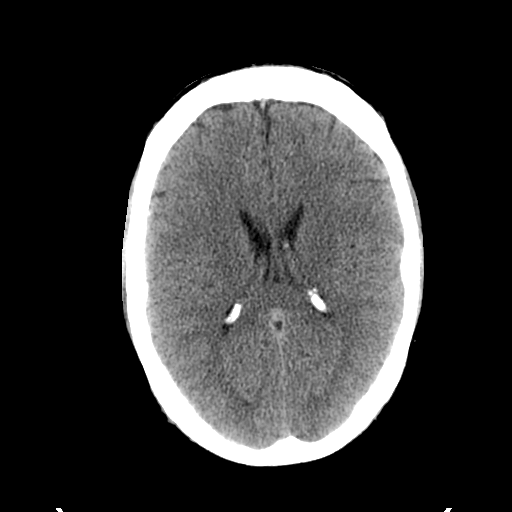
[im 19/36  bone]
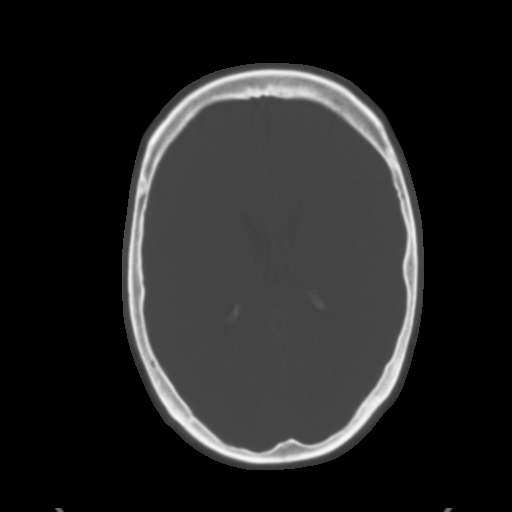
[im 21/36  brain]
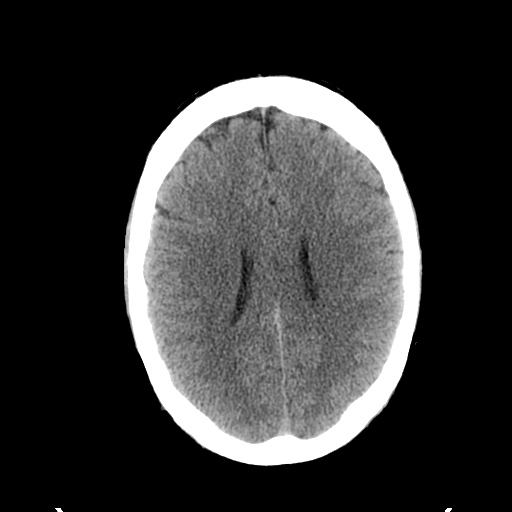
[im 23/36  brain]
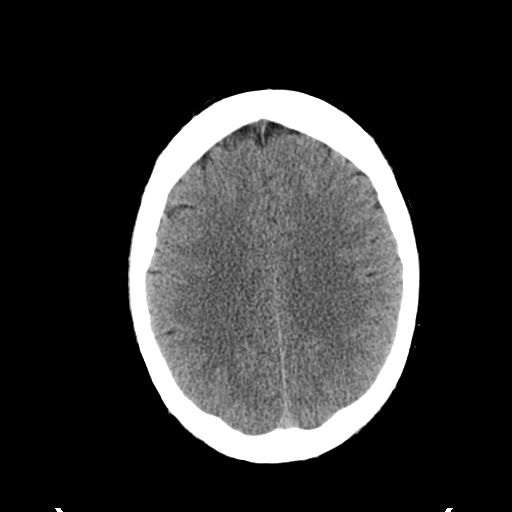
[im 26/36  brain]
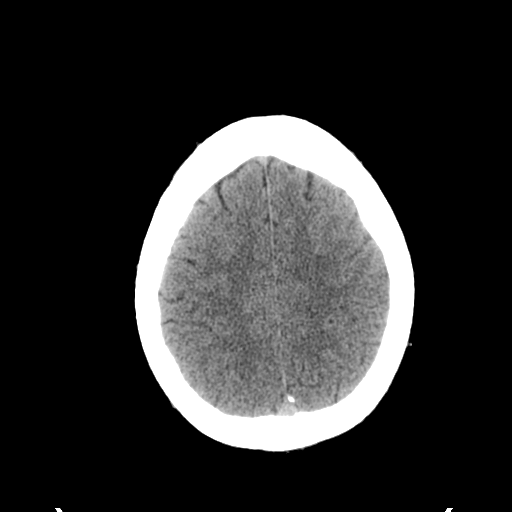
[im 27/36  brain]
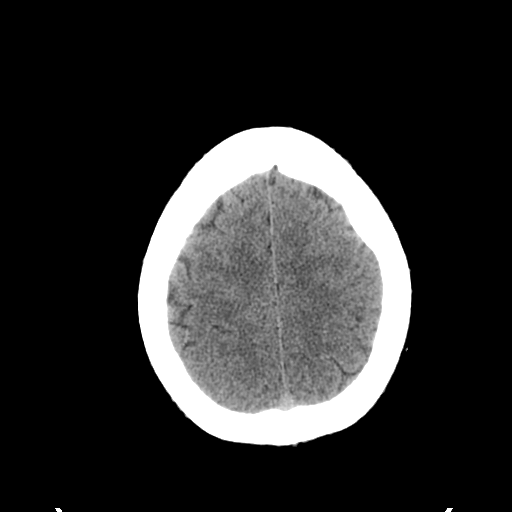
[im 27/36  bone]
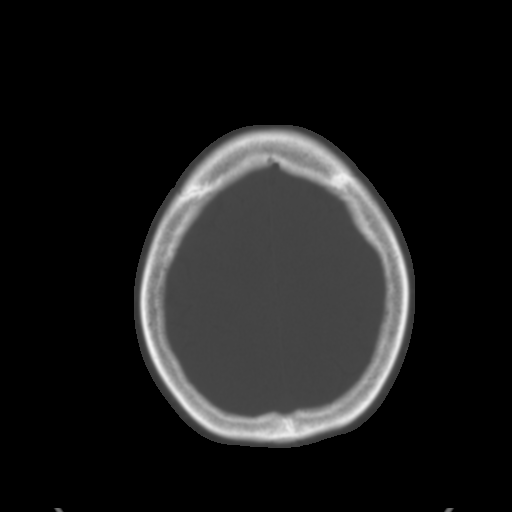
[im 29/36  brain]
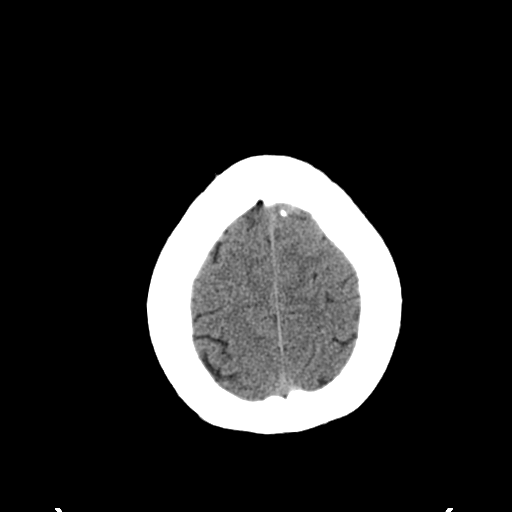
[im 32/36  brain]
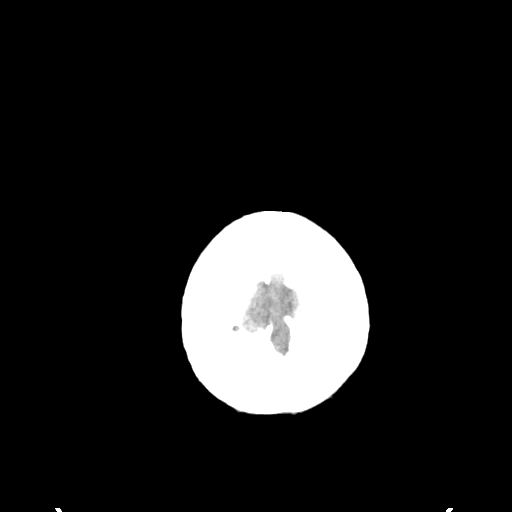
[im 34/36  brain]
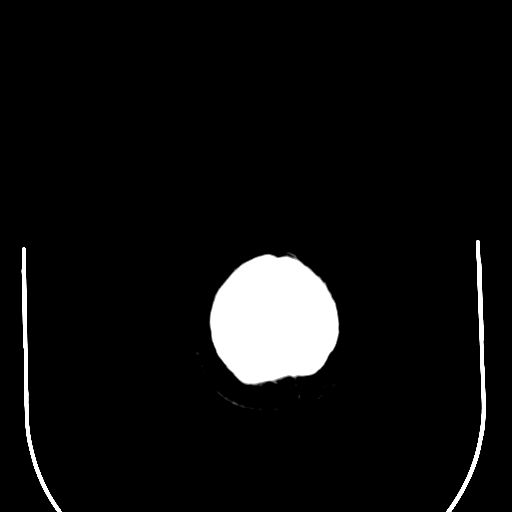

[16 of 30 positions shown; findings below may reference images not displayed]

FINDINGS: There is no intracranial hemorrhage, mass or evidence of acute
infarction. There is no extra-axial fluid collection. Gray matter
and white matter appear normal. Cerebral volume is normal for age.
Brainstem and posterior fossa are unremarkable. The CSF spaces
appear normal.

The bony structures are intact. The visible portions of the
paranasal sinuses are clear.
IMPRESSION: Normal brain

## 2016-09-02 ENCOUNTER — Other Ambulatory Visit: Payer: Self-pay | Admitting: Cardiology

## 2016-09-10 ENCOUNTER — Other Ambulatory Visit: Payer: Self-pay | Admitting: Cardiology

## 2016-10-07 ENCOUNTER — Other Ambulatory Visit: Payer: Self-pay | Admitting: Cardiology

## 2016-12-06 ENCOUNTER — Other Ambulatory Visit: Payer: Self-pay | Admitting: Cardiology

## 2016-12-10 ENCOUNTER — Other Ambulatory Visit: Payer: Self-pay | Admitting: Cardiology

## 2017-01-03 ENCOUNTER — Other Ambulatory Visit: Payer: Self-pay | Admitting: Cardiology

## 2017-01-29 ENCOUNTER — Other Ambulatory Visit: Payer: Self-pay | Admitting: Cardiology

## 2017-02-26 ENCOUNTER — Other Ambulatory Visit: Payer: Self-pay | Admitting: Cardiology

## 2017-04-12 ENCOUNTER — Other Ambulatory Visit: Payer: Self-pay | Admitting: Cardiology

## 2017-04-13 ENCOUNTER — Other Ambulatory Visit: Payer: Self-pay | Admitting: Cardiology

## 2017-04-14 ENCOUNTER — Other Ambulatory Visit: Payer: Self-pay | Admitting: Cardiology

## 2017-05-25 ENCOUNTER — Other Ambulatory Visit: Payer: Self-pay | Admitting: Cardiology

## 2017-05-30 ENCOUNTER — Other Ambulatory Visit: Payer: Self-pay | Admitting: Cardiology

## 2017-05-30 MED ORDER — LOSARTAN POTASSIUM 100 MG PO TABS: 100.0000 mg | ORAL_TABLET | Freq: Every day | ORAL | 1 refills | Status: DC

## 2017-05-30 MED ORDER — METOPROLOL TARTRATE 100 MG PO TABS: 100.0000 mg | ORAL_TABLET | Freq: Two times a day (BID) | ORAL | 1 refills | Status: DC

## 2017-05-30 MED ORDER — METOPROLOL TARTRATE 50 MG PO TABS: 50.0000 mg | ORAL_TABLET | Freq: Two times a day (BID) | ORAL | 1 refills | Status: DC

## 2017-05-30 NOTE — Telephone Encounter (Signed)
° ° °  1. Which medications need to be refilled? (please list name of each medication and dose if known) (LOPRESSOR) 100 MG Lopressor 50mg   Protonix 40 mg, Losartan      2. Which pharmacy/location (including street and city if local pharmacy) is medication to be sent to[facility]   [place],  3. Do they need a 30 day or 90 day supply?   Patient has upcoming appointment on 07/09/17 with Dr. Harl Bowie.

## 2017-05-30 NOTE — Telephone Encounter (Signed)
Done

## 2017-05-31 ENCOUNTER — Other Ambulatory Visit: Payer: Self-pay | Admitting: Cardiology

## 2017-05-31 NOTE — Telephone Encounter (Signed)
30 day supply sent this morning

## 2017-05-31 NOTE — Telephone Encounter (Signed)
Patient walk in - went back to pharmacy and they told him RX for pantoprazole (PROTONIX) 40 MG tablet was only for 15 days.  Patient asked that enough be sent in until his appointment July 09, 2017.  He work 3rd shift and asked if you do not get him to leave a message

## 2017-07-09 ENCOUNTER — Ambulatory Visit (INDEPENDENT_AMBULATORY_CARE_PROVIDER_SITE_OTHER): Payer: BLUE CROSS/BLUE SHIELD | Admitting: Cardiology

## 2017-07-09 ENCOUNTER — Other Ambulatory Visit: Payer: Self-pay

## 2017-07-09 ENCOUNTER — Encounter: Payer: Self-pay | Admitting: Cardiology

## 2017-07-09 VITALS — BP 134/93 | HR 79 | Ht 71.0 in | Wt 196.0 lb

## 2017-07-09 DIAGNOSIS — E782 Mixed hyperlipidemia: Secondary | ICD-10-CM | POA: Diagnosis not present

## 2017-07-09 DIAGNOSIS — R002 Palpitations: Secondary | ICD-10-CM | POA: Diagnosis not present

## 2017-07-09 DIAGNOSIS — I2510 Atherosclerotic heart disease of native coronary artery without angina pectoris: Secondary | ICD-10-CM

## 2017-07-09 DIAGNOSIS — I10 Essential (primary) hypertension: Secondary | ICD-10-CM | POA: Diagnosis not present

## 2017-07-09 NOTE — Progress Notes (Signed)
Clinical Summary Timothy Walker is a 52 y.o.male seen today for follow up of the following medical problems.   1. CAD  - admit to [facility] 01/13/13 to 01/17/13 with STEMI, DES to RCA.  - patient presented in cardiogenic shock, suffered VT/VF arrest requiring multiple defibrillations and IV amiodarone. Following PCI required balloon pump and vasopressors and temporary transvenous pacing.  - Echo LVEF 45-50%, inferior hypokinesis, normal diastolic function     - no significant chest pain. Incraesed SOB, poor inhaler. +coughing, +wheezing - compliant with meds  2. HTN   - has not taken meds yet today.   3. Hyperlipidemia  -he is compliant with statin - no recent labs.   4. COPD - abnormal PFTs in 2015 - mixed compliance with inhalers.    5. Palpitations - nonspecific feeling, fluttering like feeling.  - lasts 15-20 seconds. Infrequent - heavy caffeine  SH: Passenger transport manager    Past Medical History:  Diagnosis Date  . COPD (chronic obstructive pulmonary disease) ([place])   . Coronary artery disease    a. 12/2012 Inf STEMI/Cath/PCI: LM nl, LAD nl, D1 50ost, D2 min irregs, D3 small, LCX 80-66m, OM1/2/3 min irregs, RI 20p, RCA 50p/100d (3.5x18 Xience DEs),PDA/PL nl, EF 35% - complicated by VF/CGS/IABP/VDRF  . GERD (gastroesophageal reflux disease)   . Hiatal hernia   . Hypertension   . Ischemic cardiomyopathy    a. 12/2012 Echo: EF 45-50%, basal inf, inflat, mid inf HK, mildly reduced RV fxn.  . Marijuana abuse   . MI (myocardial infarction) 2014  . Obstructive sleep apnea   . Tobacco abuse      No Known Allergies   Current Outpatient Medications  Medication Sig Dispense Refill  . acetaminophen (TYLENOL) 500 MG tablet Take 500 mg by mouth daily as needed for mild pain, moderate pain or headache.     . albuterol (PROVENTIL HFA;VENTOLIN HFA) 108 (90 Base) MCG/ACT inhaler Inhale 1-2 puffs into the lungs every 6 (six) hours as needed for wheezing or  shortness of breath. 1 Inhaler 2  . amLODipine (NORVASC) 10 MG tablet TAKE ONE TABLET BY MOUTH ONCE DAILY 90 tablet 3  . aspirin 81 MG tablet Take 81 mg by mouth every morning.     Marland Kitchen atorvastatin (LIPITOR) 80 MG tablet TAKE 1 TABLET (80 MG TOTAL) BY MOUTH DAILY AT 6 PM. 30 tablet 6  . losartan (COZAAR) 100 MG tablet Take 1 tablet (100 mg total) by mouth daily. 30 tablet 1  . metoprolol tartrate (LOPRESSOR) 100 MG tablet Take 1 tablet (100 mg total) by mouth 2 (two) times daily. 60 tablet 1  . metoprolol tartrate (LOPRESSOR) 50 MG tablet Take 1 tablet (50 mg total) by mouth 2 (two) times daily. 60 tablet 1  . nitroGLYCERIN (NITROSTAT) 0.4 MG SL tablet Place 1 tablet (0.4 mg total) under the tongue every 5 (five) minutes as needed for chest pain. 25 tablet 3  . Nitroglycerin 0.4 % OINT Place 1 inch rectally 2 (two) times daily. Wear gloves, wash hands after applying. Monitor for headache or dizziness. (Patient taking differently: Place 1 inch rectally 2 (two) times daily as needed. Wear gloves, wash hands after applying. Monitor for headache or dizziness.) 1 Tube 1  . pantoprazole (PROTONIX) 40 MG tablet TAKE 1 TABLET BY MOUTH TWICE A DAY *NEED APP FOR FURTHER REFILLS* 30 tablet 1  . sildenafil (VIAGRA) 100 MG tablet Take 1 tablet (100 mg total) by mouth daily as needed for erectile dysfunction.  6 tablet 11  . tiotropium (SPIRIVA) 18 MCG inhalation capsule Place 1 capsule (18 mcg total) into inhaler and inhale daily. 30 capsule 12  . varenicline (CHANTIX CONTINUING MONTH PAK) 1 MG tablet TAKE 1 TAB TWICE DAILY FOR 11 WEEKS 154 tablet 0  . varenicline (CHANTIX) 0.5 MG tablet TAKE 0.5 MG DAILY FOR 3 DAYS THEN TAKE 2 TIMES DAILY FOR 4 DAYS 11 tablet 0   No current facility-administered medications for this visit.      Past Surgical History:  Procedure Laterality Date  . COLONOSCOPY WITH PROPOFOL N/A 05/14/2014   Timothy Walker: anal canal hemorrhoid, rectosigmoid hyerpplastic polyp, right-sided  divetriculosis   . CORONARY ANGIOPLASTY  01/13/2013   STENT TO RCA BY Timothy Walker  . DENTAL SURGERY    . ESOPHAGEAL DILATION N/A 05/14/2014   Procedure: ESOPHAGEAL DILATION [place];  Surgeon: Timothy Walker;  Location: [facility];  Service: Endoscopy;  Laterality: N/A;  . ESOPHAGOGASTRODUODENOSCOPY (EGD) WITH PROPOFOL N/A 05/14/2014   Timothy Walker: empiric dilation, abnormal esophagus s/p biopsy (normal), small hiatal hernia  . LEFT HEART CATHETERIZATION WITH CORONARY ANGIOGRAM N/A 01/13/2013   Procedure: LEFT HEART CATHETERIZATION WITH CORONARY ANGIOGRAM;  Surgeon: Timothy Walker;  Location: [place] CATH LAB;  Service: Cardiovascular;  Laterality: N/A;  . NASAL SEPTUM SURGERY    . PERCUTANEOUS CORONARY STENT INTERVENTION (PCI-S)  01/13/2013   Procedure: PERCUTANEOUS CORONARY STENT INTERVENTION (PCI-S);  Surgeon: Timothy Walker;  Location: [facility] CATH LAB;  Service: Cardiovascular;;  . POLYPECTOMY  05/14/2014   Procedure: POLYPECTOMY;  Surgeon: Timothy Walker;  Location: [facility];  Service: Endoscopy;;     No Known Allergies    Family History  Problem Relation Age of Onset  . Colon cancer Neg Hx      Social History Timothy Walker reports that he has been smoking cigarettes.  He started smoking about 35 years ago. He has a 46.50 pack-year smoking history. He has never used smokeless tobacco. Timothy Walker reports that he drinks about 1.2 oz of alcohol per week.   Review of Systems CONSTITUTIONAL: No weight loss, fever, chills, weakness or fatigue.  HEENT: Eyes: No visual loss, blurred vision, double vision or yellow sclerae.No hearing loss, sneezing, congestion, runny nose or sore throat.  SKIN: No rash or itching.  CARDIOVASCULAR: per hpi RESPIRATORY:per hpi GASTROINTESTINAL: No anorexia, nausea, vomiting or diarrhea. No abdominal pain or blood.  GENITOURINARY: No burning on urination, no polyuria NEUROLOGICAL: No headache, dizziness, syncope, paralysis, ataxia, numbness or  tingling in the extremities. No change in bowel or bladder control.  MUSCULOSKELETAL: No muscle, back pain, joint pain or stiffness.  LYMPHATICS: No enlarged nodes. No history of splenectomy.  PSYCHIATRIC: No history of depression or anxiety.  ENDOCRINOLOGIC: No reports of sweating, cold or heat intolerance. No polyuria or polydipsia.  Marland Kitchen   Physical Examination Vitals:   07/09/17 0826  BP: (!) 134/93  Pulse: 79  SpO2: 96%   Vitals:   07/09/17 0826  Weight: 196 lb (88.9 kg)  Height: 5\' 11"  (1.803 m)    Gen: resting comfortably, no acute distress HEENT: no scleral icterus, pupils equal round and reactive, no palptable cervical adenopathy,  CV: RRR, no mr/g, no jvd Resp: mild expiratory wheezing.  GI: abdomen is soft, non-tender, non-distended, normal bowel sounds, no hepatosplenomegaly MSK: extremities are warm, no edema.  Skin: warm, no rash Neuro:  no focal deficits Psych: appropriate affect   Diagnostic Studies Cath 12/2012 Hemodynamics: AO: 119/82 mmHg  LV: 119/15 mmHg  LVEDP: 28 mmHg  Coronary angiography: Coronary dominance: right   Left Main: normal   Left Anterior Descending (LAD): normal in size with minor irregularities throughout its course but no evidence of obstructive disease.   1st diagonal (D1): small in size with 50% ostial stenosis.   2nd diagonal (D2): small in size with minor irregularities.   3rd diagonal (D3): very small in size.   Circumflex (LCx): normal in size and nondominant. There is an 80-90% mid stenosis at the origin of OM 2.   1st obtuse marginal: small in size with minor irregularities.   2nd obtuse marginal: normal in size with no significant disease.   3rd obtuse marginal: medium in size with no significant disease.   Ramus Intermedius: normal in size with 20% proximal stenosis.   Right Coronary Artery: large in size and dominant. There is 50% proximal stenosis. The vessel is occluded distally with large  thrombus burden.   Posterior descending artery: normal   Posterior AV segment: normal   Posterolateral branchs: normal Left ventriculography: Left ventricular systolic function is normal , LVEF is estimated at 55 %, there is no significant mitral regurgitation . mild inferior wall hypokinesis  PCI Data: Vessel - distal RCA/Segment - 3  Percent Stenosis (pre) 100%  TIMI-flow 0  Stent 3.5 x 18 mm Xience expedition drug-eluting stent  Percent Stenosis (post) 0%  TIMI-flow (post) 3  Final Conclusions:  1. Refractory ventricular fibrillation and cardiogenic shock due to inferior ST elevation myocardial infarction. Occluded distal right coronary artery. There is significant bifurcation disease in the mid left circumflex supplying an overall small to medium sized territory.  2. Profound hypotension on presentation with subsequent improvement with revascularization and intra-aortic balloon pump placement. 3. Normal LV systolic function with moderately elevated left ventricular end-diastolic pressure. 4. Successful PCI and drug-eluting stent placement to the right coronary artery.  01/13/13 Echo:  LVEF 45-50%, inferior hypokinesis, normal diastolic function   91/47/82 14 day event monitor: no significant arrhythmias, palpitations correlate with sinus rhythm, occasional PVCs.   02/2014 PFTs + obstruction, + COPD       Assessment and Plan   1. CAD  -no recent symptoms - EKG today shows SR, no ischemic changes - continue current meds  2. HTN  -mildly elevated in clinic, has not taken meds yet - continue to monitor.   3. Hyperlipidemia  -repeat labs, continue statin   4. Palpitations - mild and infrequent, counseled to wean caffeine    Obtain annual labs. F/u 1 year    Arnoldo Lenis, M.D

## 2017-07-09 NOTE — Patient Instructions (Signed)
Your physician wants you to follow-up in: [place] will receive a reminder letter in the mail two months in advance. If you don't receive a letter, please call our office to schedule the follow-up appointment.  Your physician recommends that you continue on your current medications as directed. Please refer to the Current Medication list given to you today.  Your physician recommends that you return for lab work - PLEASE FAST 6-8 HOURS PRIOR TO LAB WORK - CBC/TSH/LIPIDS/HGBA1C/BMP/MG  Thank you for choosing [facility]!!

## 2017-08-03 ENCOUNTER — Other Ambulatory Visit: Payer: Self-pay | Admitting: Cardiology

## 2017-08-04 ENCOUNTER — Other Ambulatory Visit: Payer: Self-pay | Admitting: Cardiology

## 2017-10-01 ENCOUNTER — Other Ambulatory Visit: Payer: Self-pay | Admitting: Cardiology

## 2017-12-03 ENCOUNTER — Other Ambulatory Visit: Payer: Self-pay | Admitting: Cardiology

## 2018-01-14 ENCOUNTER — Encounter

## 2018-01-14 ENCOUNTER — Ambulatory Visit: Payer: BLUE CROSS/BLUE SHIELD | Admitting: Gastroenterology

## 2018-01-14 ENCOUNTER — Encounter: Payer: Self-pay | Admitting: Gastroenterology

## 2018-01-14 VITALS — BP 148/100 | HR 78 | Temp 97.0°F | Ht 71.0 in | Wt 190.8 lb

## 2018-01-14 DIAGNOSIS — R140 Abdominal distension (gaseous): Secondary | ICD-10-CM

## 2018-01-14 DIAGNOSIS — A630 Anogenital (venereal) warts: Secondary | ICD-10-CM | POA: Diagnosis not present

## 2018-01-14 DIAGNOSIS — K649 Unspecified hemorrhoids: Secondary | ICD-10-CM

## 2018-01-14 DIAGNOSIS — K219 Gastro-esophageal reflux disease without esophagitis: Secondary | ICD-10-CM | POA: Diagnosis not present

## 2018-01-14 DIAGNOSIS — R1310 Dysphagia, unspecified: Secondary | ICD-10-CM | POA: Diagnosis not present

## 2018-01-14 DIAGNOSIS — K643 Fourth degree hemorrhoids: Secondary | ICD-10-CM

## 2018-01-14 DIAGNOSIS — R1319 Other dysphagia: Secondary | ICD-10-CM

## 2018-01-14 NOTE — Progress Notes (Signed)
Primary Care Physician:  Sinda Du, MD  Primary Gastroenterologist:  Garfield Cornea, MD   Chief Complaint  Patient presents with  . Gas    'stomach gurgles and makes weird sounds"  . Bloated    mostly after eating  . Hemorrhoids    occas bleeding, painful    HPI:  Timothy Walker is a 52 y.o. male here for further evaluation of bloating/gas, hemorrhoids. He was last seen in 09/2015. He has h/o hemorrhoids and anal condylomas. Last EGD in 2016 s/p empiric dilation. Colonoscopy at that time with hyperplastic polyps. When he was seen in 2017 he was noted to have lesions suspicious for anal condylomas. He was referred to general surgery for management. He states he was given some cream that burned and he never followed up.   A few months ago, he noted "gas pain" in the upper left abd and radiated into the left shoulder. If he forced himself to burp he would get some relief. That pain is now gone. Feels bloated after meals. Stomach makes a lot of noises. Symptoms worse with spicy foods which he loves to eat. When he drinks carbonated beverages it feels like he is swallowing a golf ball. Associated with a lot of pain. Happens some with tea/water. Can go weeks at a time without problems egd/ed in the past helped. Has some problems swallowing meds too. He takes pantoprazole BID. BM 1-2 per day, doesn't feel complete. Sits for long period of times, hours reading on the toilet. Denies constipation. Uses baby powders on this bottom for years and worried about cancer. Has rectal pain related to hemorrhoids and lesions on bottom. Sometimes brbpr. Sometimes hard to clean up after a BM.   Current Outpatient Medications  Medication Sig Dispense Refill  . acetaminophen (TYLENOL) 500 MG tablet Take 500 mg by mouth daily as needed for mild pain, moderate pain or headache.     . albuterol (PROVENTIL HFA;VENTOLIN HFA) 108 (90 Base) MCG/ACT inhaler Inhale 1-2 puffs into the lungs every 6 (six) hours as needed for  wheezing or shortness of breath. 1 Inhaler 2  . amLODipine (NORVASC) 10 MG tablet TAKE ONE TABLET BY MOUTH ONCE DAILY 90 tablet 3  . aspirin 81 MG tablet Take 81 mg by mouth every morning.     Marland Kitchen atorvastatin (LIPITOR) 80 MG tablet TAKE 1 TABLET (80 MG TOTAL) BY MOUTH DAILY AT 6 PM. 90 tablet 1  . losartan (COZAAR) 100 MG tablet TAKE 1 TABLET BY MOUTH EVERY DAY 30 tablet 6  . metoprolol tartrate (LOPRESSOR) 100 MG tablet TAKE 1 TABLET BY MOUTH TWICE A DAY 60 tablet 6  . metoprolol tartrate (LOPRESSOR) 50 MG tablet TAKE 1 TABLET BY MOUTH TWICE A DAY 60 tablet 6  . nitroGLYCERIN (NITROSTAT) 0.4 MG SL tablet Place 1 tablet (0.4 mg total) under the tongue every 5 (five) minutes as needed for chest pain. 25 tablet 3  . pantoprazole (PROTONIX) 40 MG tablet Take 1 tablet (40 mg total) by mouth 2 (two) times daily. 60 tablet 6  . sildenafil (VIAGRA) 100 MG tablet Take 1 tablet (100 mg total) by mouth daily as needed for erectile dysfunction. 6 tablet 11  . tiotropium (SPIRIVA) 18 MCG inhalation capsule Place 1 capsule (18 mcg total) into inhaler and inhale daily. (Patient taking differently: Place 18 mcg into inhaler and inhale as needed. ) 30 capsule 12   No current facility-administered medications for this visit.     Allergies as of 01/14/2018  . (  No Known Allergies)    Past Medical History:  Diagnosis Date  . COPD (chronic obstructive pulmonary disease) ([place])   . Coronary artery disease    a. 12/2012 Inf STEMI/Cath/PCI: LM nl, LAD nl, D1 50ost, D2 min irregs, D3 small, LCX 80-39m, OM1/2/3 min irregs, RI 20p, RCA 50p/100d (3.5x18 Xience DEs),PDA/PL nl, EF 30% - complicated by VF/CGS/IABP/VDRF  . GERD (gastroesophageal reflux disease)   . Hiatal hernia   . Hypertension   . Ischemic cardiomyopathy    a. 12/2012 Echo: EF 45-50%, basal inf, inflat, mid inf HK, mildly reduced RV fxn.  . Marijuana abuse   . MI (myocardial infarction) ([place]) 2014  . Obstructive sleep apnea   . Tobacco abuse      Past Surgical History:  Procedure Laterality Date  . COLONOSCOPY WITH PROPOFOL N/A 05/14/2014   Dr. Gala Romney: anal canal hemorrhoid, rectosigmoid hyerpplastic polyp, right-sided divetriculosis   . CORONARY ANGIOPLASTY  01/13/2013   STENT TO RCA BY DR COOPER  . DENTAL SURGERY    . ESOPHAGEAL DILATION N/A 05/14/2014   Procedure: ESOPHAGEAL DILATION [place];  Surgeon: Daneil Dolin, MD;  Location: [facility];  Service: Endoscopy;  Laterality: N/A;  . ESOPHAGOGASTRODUODENOSCOPY (EGD) WITH PROPOFOL N/A 05/14/2014   Dr. Gala Romney: empiric dilation, abnormal esophagus s/p biopsy (normal), small hiatal hernia  . LEFT HEART CATHETERIZATION WITH CORONARY ANGIOGRAM N/A 01/13/2013   Procedure: LEFT HEART CATHETERIZATION WITH CORONARY ANGIOGRAM;  Surgeon: Wellington Hampshire, MD;  Location: [place] CATH LAB;  Service: Cardiovascular;  Laterality: N/A;  . NASAL SEPTUM SURGERY    . PERCUTANEOUS CORONARY STENT INTERVENTION (PCI-S)  01/13/2013   Procedure: PERCUTANEOUS CORONARY STENT INTERVENTION (PCI-S);  Surgeon: Wellington Hampshire, MD;  Location[facility] CATH LAB;  Service: Cardiovascular;;  . POLYPECTOMY  05/14/2014   Procedure: POLYPECTOMY;  Surgeon: Daneil Dolin, MD;  Location: [facility];  Service: Endoscopy;;    Family History  Problem Relation Age of Onset  . Colon cancer Neg Hx     Social History   Socioeconomic History  . Marital status: Divorced    Spouse name: Not on file  . Number of children: Not on file  . Years of education: Not on file  . Highest education level: Not on file  Occupational History  . Not on file  Social Needs  . Financial resource strain: Not on file  . Food insecurity:    Worry: Not on file    Inability: Not on file  . Transportation needs:    Medical: Not on file    Non-medical: Not on file  Tobacco Use  . Smoking status: Current Every Day Smoker    Packs/day: 1.50    Years: 31.00    Pack years: 46.50    Types: Cigarettes    Start date: 11/21/1981  . Smokeless  tobacco: Never Used  Substance and Sexual Activity  . Alcohol use: Yes    Alcohol/week: 2.0 standard drinks    Types: 1 Cans of beer, 1 Shots of liquor per week    Comment: social  . Drug use: Not Currently    Types: Marijuana  . Sexual activity: Yes  Lifestyle  . Physical activity:    Days per week: Not on file    Minutes per session: Not on file  . Stress: Not on file  Relationships  . Social connections:    Talks on phone: Not on file    Gets together: Not on file    Attends religious service: Not on  file    Active member of club or organization: Not on file    Attends meetings of clubs or organizations: Not on file    Relationship status: Not on file  . Intimate partner violence:    Fear of current or ex partner: Not on file    Emotionally abused: Not on file    Physically abused: Not on file    Forced sexual activity: Not on file  Other Topics Concern  . Not on file  Social History Narrative  . Not on file      ROS:  General: Negative for anorexia, weight loss, fever, chills, fatigue, weakness. Eyes: Negative for vision changes.  ENT: Negative for hoarseness, difficulty swallowing , nasal congestion. CV: Negative for chest pain, angina, palpitations, dyspnea on exertion, peripheral edema.  Respiratory: Negative for dyspnea at rest, dyspnea on exertion, cough, sputum, wheezing.  GI: See history of present illness. GU:  Negative for dysuria, hematuria, urinary incontinence, urinary frequency, nocturnal urination.  MS: Negative for joint pain, low back pain.  Derm: Negative for rash or itching.  Neuro: Negative for weakness, abnormal sensation, seizure, frequent headaches, memory loss, confusion.  Psych: Negative for anxiety, depression, suicidal ideation, hallucinations.  Endo: Negative for unusual weight change.  Heme: Negative for bruising or bleeding. Allergy: Negative for rash or hives.    Physical Examination:  BP (!) 148/100   Pulse 78   Temp (!) 97 F  (36.1 C) (Oral)   Ht 5\' 11"  (1.803 m)   Wt 190 lb 12.8 oz (86.5 kg)   BMI 26.61 kg/m    General: Well-nourished, well-developed in no acute distress.  Head: Normocephalic, atraumatic.   Eyes: Conjunctiva pink, no icterus. Mouth: Oropharyngeal mucosa moist and pink , no lesions erythema or exudate. Neck: Supple without thyromegaly, masses, or lymphadenopathy.  Lungs: Clear to auscultation bilaterally.  Heart: Regular rate and rhythm, no murmurs rubs or gallops.  Abdomen: Bowel sounds are normal, nontender, nondistended, no hepatosplenomegaly or masses, no abdominal bruits or    hernia , no rebound or guarding.   Rectal: large non-thrombosed hemorrhoids noted externally, at [place] there was condyloma like lesion. No masses. No stool for hemoccult.  Extremities: No lower extremity edema. No clubbing or deformities.  Neuro: Alert and oriented x 4 , grossly normal neurologically.  Skin: Warm and dry, no rash or jaundice.   Psych: Alert and cooperative, normal mood and affect.   Imaging Studies: No results found.

## 2018-01-14 NOTE — Patient Instructions (Addendum)
1. Please have your labs done. I will be checking your liver, pancreas, kidneys, for celiac disease, anemia.  2. Please try a probiotic for bloating/digestion. You can try Align daily for 4 weeks, [place].  3. Following diet for bloating and GERD.  4. I will discuss your case with Dr. Gala Romney. You may need a colonoscopy prior to seeing surgeon for hemorrhoid disease, anal condyloma (genital warts).            Abdominal Bloating When you have abdominal bloating, your abdomen may feel full, tight, or painful. It may also look bigger than normal or swollen (distended). Common causes of abdominal bloating include:  Swallowing air.  Constipation.  Problems digesting food.  Eating too much.  Irritable bowel syndrome. This is a condition that affects the large intestine.  Lactose intolerance. This is an inability to digest lactose, a natural sugar in dairy products.  Celiac disease. This is a condition that affects the ability to digest gluten, a protein found in some grains.  Gastroparesis. This is a condition that slows down the movement of food in the stomach and small intestine. It is more common in people with diabetes mellitus.  Gastroesophageal reflux disease (GERD). This is a digestive condition that makes stomach acid flow back into the esophagus.  Urinary retention. This means that the body is holding onto urine, and the bladder cannot be emptied all the way.  Follow these instructions at home: Eating and drinking  Avoid eating too much.  Try not to swallow air while talking or eating.  Avoid eating while lying down.  Avoid these foods and drinks: ? Foods that cause gas, such as broccoli, cabbage, cauliflower, and baked beans. ? Carbonated drinks. ? Hard candy. ? Chewing gum. Medicines  Take over-the-counter and prescription medicines only as told by your health care provider.  Take probiotic medicines. These medicines contain live  bacteria or yeasts that can help digestion.  Take coated peppermint oil capsules. Activity  Try to exercise regularly. Exercise may help to relieve bloating that is caused by gas and relieve constipation. General instructions  Keep all follow-up visits as told by your health care provider. This is important. Contact a health care provider if:  You have nausea and vomiting.  You have diarrhea.  You have abdominal pain.  You have unusual weight loss or weight gain.  You have severe pain, and medicines do not help. Get help right away if:  You have severe chest pain.  You have trouble breathing.  You have shortness of breath.  You have trouble urinating.  You have darker urine than normal.  You have blood in your stools or have dark, tarry stools. Summary  Abdominal bloating means that the abdomen is swollen.  Common causes of abdominal bloating are swallowing air, constipation, and problems digesting food.  Avoid eating too much and avoid swallowing air.  Avoid foods that cause gas, carbonated drinks, hard candy, and chewing gum. This information is not intended to replace advice given to you by your health care provider. Make sure you discuss any questions you have with your health care provider. Document Released: 04/14/2016 Document Revised: 04/14/2016 Document Reviewed: 04/14/2016 Elsevier Interactive Patient Education  2018 [place] for Gastroesophageal Reflux Disease, Adult When you have gastroesophageal reflux disease (GERD), the foods you eat and your eating habits are very important. Choosing the right foods can help ease the discomfort of GERD. Consider working with a diet and  nutrition specialist (dietitian) to help you make healthy food choices. What general guidelines should I follow? Eating plan  Choose healthy foods low in fat, such as fruits, vegetables, whole grains, low-fat dairy products, and lean meat, fish, and  poultry.  Eat frequent, small meals instead of three large meals each day. Eat your meals slowly, in a relaxed setting. Avoid bending over or lying down until 2-3 hours after eating.  Limit high-fat foods such as fatty meats or fried foods.  Limit your intake of oils, butter, and shortening to less than 8 teaspoons each day.  Avoid the following: ? Foods that cause symptoms. These may be different for different people. Keep a food diary to keep track of foods that cause symptoms. ? Alcohol. ? Drinking large amounts of liquid with meals. ? Eating meals during the 2-3 hours before bed.  Cook foods using methods other than frying. This may include baking, grilling, or broiling. Lifestyle   Maintain a healthy weight. Ask your health care provider what weight is healthy for you. If you need to lose weight, work with your health care provider to do so safely.  Exercise for at least 30 minutes on 5 or more days each week, or as told by your health care provider.  Avoid wearing clothes that fit tightly around your waist and chest.  Do not use any products that contain nicotine or tobacco, such as cigarettes and e-cigarettes. If you need help quitting, ask your health care provider.  Sleep with the head of your bed raised. Use a wedge under the mattress or blocks under the bed frame to raise the head of the bed. What foods are not recommended? The items listed may not be a complete list. Talk with your dietitian about what dietary choices are best for you. Grains Pastries or quick breads with added fat. [place] toast. Vegetables Deep fried vegetables. [place] fries. Any vegetables prepared with added fat. Any vegetables that cause symptoms. For some people this may include tomatoes and tomato products, chili peppers, onions and garlic, and horseradish. Fruits Any fruits prepared with added fat. Any fruits that cause symptoms. For some people this may include citrus fruits, such as oranges,  grapefruit, pineapple, and lemons. Meats and other protein foods High-fat meats, such as fatty beef or pork, hot dogs, ribs, ham, sausage, salami and bacon. Fried meat or protein, including fried fish and fried chicken. Nuts and nut butters. Dairy Whole milk and chocolate milk. Sour cream. Cream. Ice cream. Cream cheese. Milk shakes. Beverages Coffee and tea, with or without caffeine. Carbonated beverages. Sodas. Energy drinks. Fruit juice made with acidic fruits (such as orange or grapefruit). Tomato juice. Alcoholic drinks. Fats and oils Butter. Margarine. Shortening. Ghee. Sweets and desserts Chocolate and cocoa. Donuts. Seasoning and other foods Pepper. Peppermint and spearmint. Any condiments, herbs, or seasonings that cause symptoms. For some people, this may include curry, hot sauce, or vinegar-based salad dressings. Summary  When you have gastroesophageal reflux disease (GERD), food and lifestyle choices are very important to help ease the discomfort of GERD.  Eat frequent, small meals instead of three large meals each day. Eat your meals slowly, in a relaxed setting. Avoid bending over or lying down until 2-3 hours after eating.  Limit high-fat foods such as fatty meat or fried foods. This information is not intended to replace advice given to you by your health care provider. Make sure you discuss any questions you have with your health care provider. Document Released: 03/13/2005 Document  Revised: 03/14/2016 Document Reviewed: 03/14/2016 Elsevier Interactive Patient Education  [employer].

## 2018-01-15 LAB — COMPREHENSIVE METABOLIC PANEL
AG Ratio: 1.5 (calc) (ref 1.0–2.5)
ALT: 16 U/L (ref 9–46)
AST: 19 U/L (ref 10–35)
Albumin: 4.2 g/dL (ref 3.6–5.1)
Alkaline phosphatase (APISO): 62 U/L (ref 40–115)
BUN: 7 mg/dL (ref 7–25)
CO2: 29 mmol/L (ref 20–32)
Calcium: 9.7 mg/dL (ref 8.6–10.3)
Chloride: 102 mmol/L (ref 98–110)
Creat: 0.9 mg/dL (ref 0.70–1.33)
Globulin: 2.8 g/dL (calc) (ref 1.9–3.7)
Glucose, Bld: 88 mg/dL (ref 65–139)
Potassium: 4.4 mmol/L (ref 3.5–5.3)
Sodium: 138 mmol/L (ref 135–146)
Total Bilirubin: 0.5 mg/dL (ref 0.2–1.2)
Total Protein: 7 g/dL (ref 6.1–8.1)

## 2018-01-15 LAB — CBC WITH DIFFERENTIAL/PLATELET
Basophils Absolute: 21 cells/uL (ref 0–200)
Basophils Relative: 0.3 %
Eosinophils Absolute: 189 cells/uL (ref 15–500)
Eosinophils Relative: 2.7 %
HCT: 41.6 % (ref 38.5–50.0)
Hemoglobin: 14.8 g/dL (ref 13.2–17.1)
Lymphs Abs: 2870 cells/uL (ref 850–3900)
MCH: 34.3 pg — ABNORMAL HIGH (ref 27.0–33.0)
MCHC: 35.6 g/dL (ref 32.0–36.0)
MCV: 96.3 fL (ref 80.0–100.0)
MPV: 10.2 fL (ref 7.5–12.5)
Monocytes Relative: 10.5 %
Neutro Abs: 3185 cells/uL (ref 1500–7800)
Neutrophils Relative %: 45.5 %
Platelets: 299 10*3/uL (ref 140–400)
RBC: 4.32 10*6/uL (ref 4.20–5.80)
RDW: 13 % (ref 11.0–15.0)
Total Lymphocyte: 41 %
WBC mixed population: 735 cells/uL (ref 200–950)
WBC: 7 10*3/uL (ref 3.8–10.8)

## 2018-01-15 LAB — IGA: Immunoglobulin A: 446 mg/dL — ABNORMAL HIGH (ref 47–310)

## 2018-01-15 LAB — TISSUE TRANSGLUTAMINASE, IGA: (tTG) Ab, IgA: 1 U/mL

## 2018-01-15 LAB — LIPASE: Lipase: 9 U/L (ref 7–60)

## 2018-01-21 DIAGNOSIS — A630 Anogenital (venereal) warts: Secondary | ICD-10-CM | POA: Insufficient documentation

## 2018-01-21 DIAGNOSIS — R140 Abdominal distension (gaseous): Secondary | ICD-10-CM | POA: Insufficient documentation

## 2018-01-21 NOTE — Progress Notes (Signed)
cc'ed to pcp °

## 2018-01-21 NOTE — Assessment & Plan Note (Signed)
  He complains of ongoing rectal discomfort and intermittent rectal bleeding with suspected anal condyloma which remain untreated. Patient reports he never followed through with Dr. Arnoldo Morale recommendations in the past. Now with ongoing brbpr, he may require colonoscopy prior to surgery evaluation for hemorrhoid disease/anal condyloma. To discuss further with Dr. Gala Romney.

## 2018-01-21 NOTE — Assessment & Plan Note (Signed)
52 y/o male with h/o GERD who presents with complains of pp upper abdominal discomfort mostly on LUQ, bloating especially with spicy foods, fatty foods. He admits to mild to moderate alcohol use, carbonated beverages, and less than healthy diet. He often chooses smoking or eating at work with limited breaks. He has some dysphagia to pills and with carbonated beverages.   Reinforced antireflux diet and diet for bloating. Continue pantoprazole BID. He may require repeat EGD/ED. Further recommendations to follow.

## 2018-01-24 NOTE — Progress Notes (Signed)
LMOM to call.

## 2018-02-04 ENCOUNTER — Telehealth: Payer: Self-pay

## 2018-02-04 NOTE — Telephone Encounter (Signed)
Pt LMOM  for results. He works 3rd shift, but did not say what was best time to call.  I have left Vm for a return call.  See notes under CBC on 01/14/2018.

## 2018-02-07 NOTE — Telephone Encounter (Signed)
LMOM to call.

## 2018-02-07 NOTE — Progress Notes (Signed)
See separate note. I have left Vm for a return call.

## 2018-02-08 NOTE — Progress Notes (Signed)
I mailed letter for pt to call for results.

## 2018-02-08 NOTE — Telephone Encounter (Signed)
Mailed letter for pt to call for results.  

## 2018-02-19 ENCOUNTER — Telehealth: Payer: Self-pay

## 2018-02-19 NOTE — Telephone Encounter (Signed)
PT received a letter to call for results and plan.  He is aware of the lab results from Neil Crouch, [place] dated 01/24/2018. Said he still has some problems with swallowing, but his abdominal pain and bloating have improved some. He said the probiotics seemed to help.  He is not having as much rectal bleeding since he started spending less time on the toilet.  He may be changing jobs soon, so he prefers not to schedule any procedures at this time. He will call when he is ready to pursue more care. He is very appreciative of everything that has been done for him.

## 2018-02-20 NOTE — Telephone Encounter (Signed)
Noted. Thanks.

## 2018-03-12 ENCOUNTER — Other Ambulatory Visit: Payer: Self-pay | Admitting: Cardiology

## 2018-05-06 ENCOUNTER — Other Ambulatory Visit: Payer: Self-pay | Admitting: *Deleted

## 2018-05-06 MED ORDER — METOPROLOL TARTRATE 50 MG PO TABS: 50.0000 mg | ORAL_TABLET | Freq: Two times a day (BID) | ORAL | 0 refills | Status: DC

## 2018-06-11 ENCOUNTER — Other Ambulatory Visit: Payer: Self-pay

## 2018-07-01 ENCOUNTER — Other Ambulatory Visit: Payer: Self-pay | Admitting: Cardiology

## 2018-07-02 ENCOUNTER — Other Ambulatory Visit: Payer: Self-pay | Admitting: Cardiology

## 2018-07-02 MED ORDER — ATORVASTATIN CALCIUM 80 MG PO TABS: 80.0000 mg | ORAL_TABLET | Freq: Every day | ORAL | 0 refills | Status: DC

## 2018-07-02 MED ORDER — PANTOPRAZOLE SODIUM 40 MG PO TBEC: 40.0000 mg | DELAYED_RELEASE_TABLET | Freq: Two times a day (BID) | ORAL | 3 refills | Status: DC

## 2018-07-02 MED ORDER — METOPROLOL TARTRATE 100 MG PO TABS: 100.0000 mg | ORAL_TABLET | Freq: Two times a day (BID) | ORAL | 3 refills | Status: DC

## 2018-07-02 NOTE — Telephone Encounter (Signed)
°*  STAT* If patient is at the pharmacy, call can be transferred to refill team.   1. Which medications need to be refilled?  metoprolol tartrate (LOPRESSOR) 100 MG tablet  atorvastatin (LIPITOR) 80 MG tablet   90 day  pantoprazole (PROTONIX) 40 MG tablet       2. Which pharmacy/location (including street and city if local pharmacy) is medication to be sent to? [facility] [place], [place]   3. Do they need a 30 day or 90 day supply? [place]

## 2018-07-02 NOTE — Telephone Encounter (Signed)
Medication sent to pharmacy  

## 2018-07-02 NOTE — Telephone Encounter (Signed)
°*  STAT* If patient is at the pharmacy, call can be transferred to refill team.   1. Which medications need to be refilled? Spiriva 18 MCG - [place] 2. Which pharmacy/location (including street and city if local pharmacy) is [facility] to [facility]  [id number], [place]  3. Do they need a 30 day or 90 day supply? [place]

## 2018-07-03 ENCOUNTER — Other Ambulatory Visit: Payer: Self-pay | Admitting: *Deleted

## 2018-07-03 MED ORDER — TIOTROPIUM BROMIDE MONOHYDRATE 18 MCG IN CAPS: 18.0000 ug | ORAL_CAPSULE | Freq: Every day | RESPIRATORY_TRACT | 3 refills | Status: DC

## 2018-09-05 ENCOUNTER — Other Ambulatory Visit: Payer: Self-pay | Admitting: Cardiology

## 2018-09-24 ENCOUNTER — Telehealth: Payer: Self-pay | Admitting: *Deleted

## 2018-09-24 NOTE — Telephone Encounter (Signed)
Reports having symptoms last night similar to past symptoms when he had a heart attack. Reports sweating, nausea, chest pain 7/10, lightheaded. Used nitroglycerin x's 1 and felt relief. Also took 3 baby aspirin. Not having active chest pain or any other symptoms. Symptoms discussed with Dr. Harl Bowie and advised to add patient to 9:40 am slot for in the morning. Patient advised if symptoms return or get worse, to go to the ED for an evaluation. Verbalized understanding of plan.   Patient verbally consented for telehealth visits with [facility] and understands that his insurance company will be billed for the encounter.   Unable to check vitals

## 2018-09-25 ENCOUNTER — Observation Stay (HOSPITAL_COMMUNITY)
Admission: EM | Admit: 2018-09-25 | Discharge: 2018-09-26 | Disposition: A | Discharge status: Home / Self Care | Payer: BC Managed Care – PPO | Attending: Cardiology | Admitting: Cardiology

## 2018-09-25 ENCOUNTER — Emergency Department (HOSPITAL_COMMUNITY): Payer: BC Managed Care – PPO

## 2018-09-25 ENCOUNTER — Encounter (HOSPITAL_COMMUNITY): Payer: Self-pay | Admitting: Emergency Medicine

## 2018-09-25 ENCOUNTER — Telehealth (INDEPENDENT_AMBULATORY_CARE_PROVIDER_SITE_OTHER): Payer: BC Managed Care – PPO | Admitting: Cardiology

## 2018-09-25 ENCOUNTER — Encounter: Payer: Self-pay | Admitting: Cardiology

## 2018-09-25 ENCOUNTER — Other Ambulatory Visit: Payer: Self-pay

## 2018-09-25 VITALS — Ht 71.0 in | Wt 185.0 lb

## 2018-09-25 DIAGNOSIS — Z8249 Family history of ischemic heart disease and other diseases of the circulatory system: Secondary | ICD-10-CM | POA: Diagnosis not present

## 2018-09-25 DIAGNOSIS — I252 Old myocardial infarction: Secondary | ICD-10-CM | POA: Insufficient documentation

## 2018-09-25 DIAGNOSIS — E785 Hyperlipidemia, unspecified: Secondary | ICD-10-CM | POA: Insufficient documentation

## 2018-09-25 DIAGNOSIS — Z789 Other specified health status: Secondary | ICD-10-CM

## 2018-09-25 DIAGNOSIS — G4733 Obstructive sleep apnea (adult) (pediatric): Secondary | ICD-10-CM | POA: Insufficient documentation

## 2018-09-25 DIAGNOSIS — Z7982 Long term (current) use of aspirin: Secondary | ICD-10-CM | POA: Insufficient documentation

## 2018-09-25 DIAGNOSIS — Z8674 Personal history of sudden cardiac arrest: Secondary | ICD-10-CM | POA: Insufficient documentation

## 2018-09-25 DIAGNOSIS — Z955 Presence of coronary angioplasty implant and graft: Secondary | ICD-10-CM | POA: Diagnosis not present

## 2018-09-25 DIAGNOSIS — F1020 Alcohol dependence, uncomplicated: Secondary | ICD-10-CM

## 2018-09-25 DIAGNOSIS — I25110 Atherosclerotic heart disease of native coronary artery with unstable angina pectoris: Secondary | ICD-10-CM

## 2018-09-25 DIAGNOSIS — I200 Unstable angina: Secondary | ICD-10-CM | POA: Diagnosis not present

## 2018-09-25 DIAGNOSIS — Z7289 Other problems related to lifestyle: Secondary | ICD-10-CM

## 2018-09-25 DIAGNOSIS — I340 Nonrheumatic mitral (valve) insufficiency: Secondary | ICD-10-CM | POA: Diagnosis not present

## 2018-09-25 DIAGNOSIS — K219 Gastro-esophageal reflux disease without esophagitis: Secondary | ICD-10-CM | POA: Diagnosis not present

## 2018-09-25 DIAGNOSIS — R002 Palpitations: Secondary | ICD-10-CM | POA: Insufficient documentation

## 2018-09-25 DIAGNOSIS — Z1159 Encounter for screening for other viral diseases: Secondary | ICD-10-CM | POA: Insufficient documentation

## 2018-09-25 DIAGNOSIS — I2510 Atherosclerotic heart disease of native coronary artery without angina pectoris: Secondary | ICD-10-CM

## 2018-09-25 DIAGNOSIS — I420 Dilated cardiomyopathy: Secondary | ICD-10-CM | POA: Diagnosis not present

## 2018-09-25 DIAGNOSIS — I10 Essential (primary) hypertension: Secondary | ICD-10-CM | POA: Diagnosis present

## 2018-09-25 DIAGNOSIS — E782 Mixed hyperlipidemia: Secondary | ICD-10-CM

## 2018-09-25 DIAGNOSIS — I255 Ischemic cardiomyopathy: Secondary | ICD-10-CM

## 2018-09-25 DIAGNOSIS — I25118 Atherosclerotic heart disease of native coronary artery with other forms of angina pectoris: Principal | ICD-10-CM | POA: Insufficient documentation

## 2018-09-25 DIAGNOSIS — Z720 Tobacco use: Secondary | ICD-10-CM | POA: Diagnosis present

## 2018-09-25 DIAGNOSIS — J449 Chronic obstructive pulmonary disease, unspecified: Secondary | ICD-10-CM | POA: Diagnosis not present

## 2018-09-25 DIAGNOSIS — Z79899 Other long term (current) drug therapy: Secondary | ICD-10-CM | POA: Insufficient documentation

## 2018-09-25 DIAGNOSIS — F17210 Nicotine dependence, cigarettes, uncomplicated: Secondary | ICD-10-CM | POA: Insufficient documentation

## 2018-09-25 DIAGNOSIS — R0789 Other chest pain: Secondary | ICD-10-CM

## 2018-09-25 LAB — BASIC METABOLIC PANEL
Anion gap: 10 (ref 5–15)
BUN: 9 mg/dL (ref 6–20)
CO2: 24 mmol/L (ref 22–32)
Calcium: 9.4 mg/dL (ref 8.9–10.3)
Chloride: 101 mmol/L (ref 98–111)
Creatinine, Ser: 0.78 mg/dL (ref 0.61–1.24)
GFR calc Af Amer: >60 mL/min (ref >60)
GFR calc non Af Amer: >60 mL/min (ref >60)
Glucose, Bld: 100 mg/dL — ABNORMAL HIGH (ref 70–99)
Potassium: 3.5 mmol/L (ref 3.5–5.1)
Sodium: 135 mmol/L (ref 135–145)

## 2018-09-25 LAB — CBC
HCT: 44.8 % (ref 39.0–52.0)
Hemoglobin: 14.8 g/dL (ref 13.0–17.0)
MCH: 33.2 pg (ref 26.0–34.0)
MCHC: 33 g/dL (ref 30.0–36.0)
MCV: 100.4 fL — ABNORMAL HIGH (ref 80.0–100.0)
Platelets: 299 10*3/uL (ref 150–400)
RBC: 4.46 MIL/uL (ref 4.22–5.81)
RDW: 13.2 % (ref 11.5–15.5)
WBC: 9.6 10*3/uL (ref 4.0–10.5)
nRBC: 0 % (ref 0.0–0.2)

## 2018-09-25 LAB — PHOSPHORUS: Phosphorus: 3.9 mg/dL (ref 2.5–4.6)

## 2018-09-25 LAB — MAGNESIUM: Magnesium: 2 mg/dL (ref 1.7–2.4)

## 2018-09-25 LAB — SARS CORONAVIRUS 2 BY RT PCR (HOSPITAL ORDER, PERFORMED IN ~~LOC~~ HOSPITAL LAB): SARS Coronavirus 2: NEGATIVE

## 2018-09-25 LAB — TROPONIN I (HIGH SENSITIVITY)
Troponin I (High Sensitivity): 3 ng/L (ref <18)
Troponin I (High Sensitivity): 4 ng/L (ref <18)

## 2018-09-25 MED ORDER — ACETAMINOPHEN 650 MG RE SUPP: 650.0000 mg | Freq: Four times a day (QID) | RECTAL | Status: DC | PRN

## 2018-09-25 MED ORDER — AMLODIPINE BESYLATE 5 MG PO TABS
10.0000 mg | ORAL_TABLET | Freq: Every day | ORAL | Status: DC
  Administered 2018-09-25: 10 mg via ORAL
  Filled 2018-09-25 (×2): qty 2

## 2018-09-25 MED ORDER — ALPRAZOLAM 0.25 MG PO TABS
0.2500 mg | ORAL_TABLET | Freq: Two times a day (BID) | ORAL | Status: DC | PRN
  Administered 2018-09-25 – 2018-09-26 (×2): 0.25 mg via ORAL
  Filled 2018-09-25 (×2): qty 1

## 2018-09-25 MED ORDER — ENOXAPARIN SODIUM 40 MG/0.4ML ~~LOC~~ SOLN
40.0000 mg | SUBCUTANEOUS | Status: DC
  Administered 2018-09-25: 40 mg via SUBCUTANEOUS
  Filled 2018-09-25: qty 0.4

## 2018-09-25 MED ORDER — MAGNESIUM SULFATE 2 GM/50ML IV SOLN: 2.0000 g | Freq: Once | INTRAVENOUS | Status: DC

## 2018-09-25 MED ORDER — MAGNESIUM SULFATE 2 GM/50ML IV SOLN: 2.0000 g | Freq: Once | INTRAVENOUS | Status: AC

## 2018-09-25 MED ORDER — SODIUM CHLORIDE 0.9% FLUSH
3.0000 mL | Freq: Once | INTRAVENOUS | Status: AC
  Administered 2018-09-25: 3 mL via INTRAVENOUS

## 2018-09-25 MED ORDER — NICOTINE 21 MG/24HR TD PT24
21.0000 mg | MEDICATED_PATCH | Freq: Every day | TRANSDERMAL | Status: DC
  Administered 2018-09-25 – 2018-09-26 (×2): 21 mg via TRANSDERMAL
  Filled 2018-09-25 (×2): qty 1

## 2018-09-25 MED ORDER — VITAMIN B-1 100 MG PO TABS
100.0000 mg | ORAL_TABLET | Freq: Every day | ORAL | Status: DC
  Administered 2018-09-25: 100 mg via ORAL
  Filled 2018-09-25 (×2): qty 1

## 2018-09-25 MED ORDER — ENOXAPARIN SODIUM 40 MG/0.4ML ~~LOC~~ SOLN: 40.0000 mg | SUBCUTANEOUS | Status: DC

## 2018-09-25 MED ORDER — NITROGLYCERIN 0.4 MG SL SUBL: 0.4000 mg | SUBLINGUAL_TABLET | SUBLINGUAL | 3 refills | Status: DC | PRN

## 2018-09-25 MED ORDER — ACETAMINOPHEN 325 MG PO TABS: 650.0000 mg | ORAL_TABLET | Freq: Four times a day (QID) | ORAL | Status: DC | PRN

## 2018-09-25 MED ORDER — ALBUTEROL SULFATE (2.5 MG/3ML) 0.083% IN NEBU: 2.5000 mg | INHALATION_SOLUTION | Freq: Four times a day (QID) | RESPIRATORY_TRACT | Status: DC | PRN

## 2018-09-25 MED ORDER — VALSARTAN 160 MG PO TABS: 160.0000 mg | ORAL_TABLET | Freq: Every day | ORAL | 1 refills | Status: DC

## 2018-09-25 MED ORDER — POTASSIUM CHLORIDE CRYS ER 20 MEQ PO TBCR
40.0000 meq | EXTENDED_RELEASE_TABLET | Freq: Once | ORAL | Status: AC
  Administered 2018-09-25: 40 meq via ORAL
  Filled 2018-09-25: qty 2

## 2018-09-25 MED ORDER — NITROGLYCERIN 0.4 MG SL SUBL: 0.4000 mg | SUBLINGUAL_TABLET | SUBLINGUAL | Status: DC | PRN

## 2018-09-25 MED ORDER — ASPIRIN 81 MG PO CHEW: 81.0000 mg | CHEWABLE_TABLET | Freq: Every morning | ORAL | Status: DC

## 2018-09-25 MED ORDER — METOPROLOL TARTRATE 50 MG PO TABS
100.0000 mg | ORAL_TABLET | Freq: Two times a day (BID) | ORAL | Status: DC
  Administered 2018-09-25 – 2018-09-26 (×2): 100 mg via ORAL
  Filled 2018-09-25 (×2): qty 2

## 2018-09-25 MED ORDER — METOPROLOL TARTRATE 50 MG PO TABS
50.0000 mg | ORAL_TABLET | Freq: Two times a day (BID) | ORAL | Status: DC
  Administered 2018-09-25 – 2018-09-26 (×2): 50 mg via ORAL
  Filled 2018-09-25 (×2): qty 1

## 2018-09-25 MED ORDER — PANTOPRAZOLE SODIUM 40 MG PO TBEC
40.0000 mg | DELAYED_RELEASE_TABLET | Freq: Two times a day (BID) | ORAL | Status: DC
  Administered 2018-09-25: 40 mg via ORAL
  Filled 2018-09-25 (×2): qty 1

## 2018-09-25 MED ORDER — PROCHLORPERAZINE EDISYLATE 10 MG/2ML IJ SOLN: 5.0000 mg | INTRAMUSCULAR | Status: DC | PRN

## 2018-09-25 MED ORDER — ATORVASTATIN CALCIUM 40 MG PO TABS
80.0000 mg | ORAL_TABLET | Freq: Every day | ORAL | Status: DC
  Administered 2018-09-25: 80 mg via ORAL
  Filled 2018-09-25: qty 2

## 2018-09-25 NOTE — Addendum Note (Signed)
Addended by: Julian Hy T on: 09/25/2018 10:40 AM   Modules accepted: Orders

## 2018-09-25 NOTE — H&P (Signed)
History and Physical    Timothy Walker DOB: 08-Apr-1965 DOA: 09/25/2018  PCP: Sinda Du, MD   Patient coming from: Home.  I have personally briefly reviewed patient's old medical records in [place]  Chief Complaint: Chest pain.  HPI: Timothy Walker is a 53 y.o. male with medical history significant of COPD, CAD, history of a STEMI in 9622 complicated by V. tach/ventricular fibrillation, cardiac arrest, ischemic cardiomyopathy in 2014 echo, GERD, hiatal hernia, hypertension, obstructive sleep apnea, tobacco abuse who is coming to the emergency department with complaints of chest pain yesterday evening while having a bowel movement in his bathroom at home around 2300 on 09/23/2018.  He describes feeling like everything was going to pass out and his vision was blacking out.  He also describes feeling chest pressure and dizziness.  He tried to stand up, but fell on his knees, call his girlfriend to help him and get his sublingual NTG.  He developed profuse sweating, intense nausea and dry heaving for several minutes.  His girlfriend gave him a cold wash rag.  He took aspirin and sublingual nitroglycerin and subsequently felt the help.  He mentions that he has been having dyspnea on exertion at times.  He is feeling more fatigue than usual.  His appetite has been decreased for the past few days and he has not been sleeping well recently.  He complains of frequent palpitations in the past few days.  He denies PND, orthopnea or pitting edema of the lower extremities.  He denies fever, sore throat, rhinorrhea, hemoptysis, but he gets frequent wheezing.  He denies emesis, diarrhea, melena or hematochezia.  He gets occasionally constipated.  No dysuria, frequency or hematuria.  Denies polyuria, polydipsia, polyphagia or blurred vision.  ED Course: Initial vital signs temperature 98.1 F, pulse 77, respirations 25, blood pressure 130/91 mmHg and O2 sat 95% on room air.  He was seen by  Dr. Conni Elliot who recommended cardiac catheterization.  He has been added to the Cath Lab schedule tomorrow.  His white count is 9.6, hemoglobin 14.8 g/dL and platelets 299.  BMP shows a nonfasting glucose of 100 mg/dL, but otherwise all other values are within normal limits.  Magnesium was 2.0 mg/dL. EKG sinus rhythm with nonspecific IVC delay.  Troponin #1 and #2 were normal.  Chest radiograph did not have any active cardiopulmonary disease, but he is showing some atelectasis versus scarring in the lung bases bilaterally.  Review of Systems: As per HPI otherwise 10 point review of systems negative.   Past Medical History:  Diagnosis Date  . COPD (chronic obstructive pulmonary disease) ([place])   . Coronary artery disease    a. 12/2012 Inf STEMI/Cath/PCI: LM nl, LAD nl, D1 50ost, D2 min irregs, D3 small, LCX 80-46m, OM1/2/3 min irregs, RI 20p, RCA 50p/100d (3.5x18 Xience DEs),PDA/PL nl, EF 29% - complicated by VF/CGS/IABP/VDRF  . GERD (gastroesophageal reflux disease)   . Hiatal hernia   . Hypertension   . Ischemic cardiomyopathy    a. 12/2012 Echo: EF 45-50%, basal inf, inflat, mid inf HK, mildly reduced RV fxn.  . Marijuana abuse   . MI (myocardial infarction) ([place]) 2014  . Obstructive sleep apnea   . Tobacco abuse     Past Surgical History:  Procedure Laterality Date  . COLONOSCOPY WITH PROPOFOL N/A 05/14/2014   Dr. Gala Romney: anal canal hemorrhoid, rectosigmoid hyerpplastic polyp, right-sided divetriculosis   . CORONARY ANGIOPLASTY  01/13/2013   STENT TO RCA BY DR COOPER  .  DENTAL SURGERY    . ESOPHAGEAL DILATION N/A 05/14/2014   Procedure: ESOPHAGEAL DILATION [place];  Surgeon: Daneil Dolin, MD;  Location: [facility];  Service: Endoscopy;  Laterality: N/A;  . ESOPHAGOGASTRODUODENOSCOPY (EGD) WITH PROPOFOL N/A 05/14/2014   Dr. Gala Romney: empiric dilation, abnormal esophagus s/p biopsy (normal), small hiatal hernia  . LEFT HEART CATHETERIZATION WITH CORONARY ANGIOGRAM N/A 01/13/2013    Procedure: LEFT HEART CATHETERIZATION WITH CORONARY ANGIOGRAM;  Surgeon: Wellington Hampshire, MD;  Location: [facility] CATH LAB;  Service: Cardiovascular;  Laterality: N/A;  . NASAL SEPTUM SURGERY    . PERCUTANEOUS CORONARY STENT INTERVENTION (PCI-S)  01/13/2013   Procedure: PERCUTANEOUS CORONARY STENT INTERVENTION (PCI-S);  Surgeon: Wellington Hampshire, MD;  Location: [facility] CATH LAB;  Service: Cardiovascular;;  . POLYPECTOMY  05/14/2014   Procedure: POLYPECTOMY;  Surgeon: Daneil Dolin, MD;  Location: [facility];  Service: Endoscopy;;     reports that he has been smoking cigarettes. He started smoking about 36 years ago. He has a 46.50 pack-year smoking history. He has never used smokeless tobacco. He reports current alcohol use of about 2.0 standard drinks of alcohol per week. He reports current drug use. Drug: Marijuana.  No Known Allergies  Family History  Problem Relation Age of Onset  . Heart attack Other   . Hypertension Other   . Colon cancer Neg Hx    Prior to Admission medications   Medication Sig Start Date End Date Taking? Authorizing Provider  acetaminophen (TYLENOL) 500 MG tablet Take 500 mg by mouth daily as needed for mild pain, moderate pain or headache.    Yes [provider]  albuterol (PROVENTIL HFA;VENTOLIN HFA) 108 (90 Base) MCG/ACT inhaler Inhale 1-2 puffs into the lungs every 6 (six) hours as needed for wheezing or shortness of breath. 01/18/16  Yes Arnoldo Lenis, MD  amLODipine (NORVASC) 10 MG tablet TAKE ONE TABLET BY MOUTH ONCE DAILY 03/12/18  Yes Branch, Alphonse Guild, MD  aspirin 81 MG tablet Take 81 mg by mouth every morning.    Yes [provider]  atorvastatin (LIPITOR) 80 MG tablet TAKE 1 TABLET (80 MG TOTAL) BY MOUTH DAILY AT 6 PM. 09/05/18  Yes Branch, Alphonse Guild, MD  losartan (COZAAR) 100 MG tablet Take 100 mg by mouth daily.   Yes [provider]  metoprolol tartrate (LOPRESSOR) 100 MG tablet Take 1 tablet (100 mg total) by mouth 2 (two) times  daily. 07/02/18  Yes Branch, Alphonse Guild, MD  metoprolol tartrate (LOPRESSOR) 50 MG tablet TAKE 1 TABLET BY MOUTH TWICE A DAY 07/01/18  Yes Branch, Alphonse Guild, MD  nitroGLYCERIN (NITROSTAT) 0.4 MG SL tablet Place 1 tablet (0.4 mg total) under the tongue every 5 (five) minutes as needed for chest pain. 09/25/18  Yes Branch, Alphonse Guild, MD  pantoprazole (PROTONIX) 40 MG tablet Take 1 tablet (40 mg total) by mouth 2 (two) times daily. 07/02/18  Yes BranchAlphonse Guild, MD  sildenafil (VIAGRA) 100 MG tablet Take 1 tablet (100 mg total) by mouth daily as needed for erectile dysfunction. 01/14/16  Yes Arnoldo Lenis, MD  tiotropium (SPIRIVA) 18 MCG inhalation capsule Place 1 capsule (18 mcg total) into inhaler and inhale daily. 07/03/18  Yes Arnoldo Lenis, MD    Physical Exam: Vitals:   09/25/18 1500 09/25/18 1530 09/25/18 1600 09/25/18 1630  BP: (!) 142/100 (!) 135/93 (!) 159/96 (!) 160/105  Pulse: 76  71 73  Resp: (!) 23  (!) 22 20  Temp:  TempSrc:      SpO2: 96%  96% 98%    Constitutional: NAD, calm, comfortable Eyes: PERRL, lids and conjunctivae normal ENMT: Mucous membranes are moist. Posterior pharynx clear of any exudate or lesions. Neck: normal, supple, no masses, no thyromegaly Respiratory: Mild decrease in air movement with mild bilateral wheezing, no rhonchi, no crackles. Normal respiratory effort. No accessory muscle use.  Cardiovascular: Regular rate and rhythm with occasional extrasystole, no murmurs / rubs / gallops. No extremity edema. 2+ pedal pulses. No carotid bruits.  Abdomen: Soft, no tenderness, no masses palpated. No hepatosplenomegaly. Bowel sounds positive.  Musculoskeletal: no clubbing / cyanosis. Good ROM, no contractures. Normal muscle tone.  Skin: no rashes, lesions, ulcers on limited dermatological examination. Neurologic: CN 2-12 grossly intact. Sensation intact, DTR normal. Strength 5/5 in all 4.  Psychiatric: Normal judgment and insight. Alert and oriented x  3. Normal mood.   Labs on Admission: I have personally reviewed following labs and imaging studies  CBC: Recent Labs  Lab 09/25/18 1216  WBC 9.6  HGB 14.8  HCT 44.8  MCV 100.4*  PLT 696   Basic Metabolic Panel: Recent Labs  Lab 09/25/18 1216  NA 135  K 3.5  CL 101  CO2 24  GLUCOSE 100*  BUN 9  CREATININE 0.78  CALCIUM 9.4  MG 2.0   GFR: Estimated Creatinine Clearance: 116.3 mL/min (by C-G formula based on SCr of 0.78 mg/dL). Liver Function Tests: No results for input(s): AST, ALT, ALKPHOS, BILITOT, PROT, ALBUMIN in the last 168 hours. No results for input(s): LIPASE, AMYLASE in the last 168 hours. No results for input(s): AMMONIA in the last 168 hours. Coagulation Profile: No results for input(s): INR, PROTIME in the last 168 hours. Cardiac Enzymes: No results for input(s): CKTOTAL, CKMB, CKMBINDEX, TROPONINI in the last 168 hours. BNP (last 3 results) No results for input(s): PROBNP in the last 8760 hours. HbA1C: No results for input(s): HGBA1C in the last 72 hours. CBG: No results for input(s): GLUCAP in the last 168 hours. Lipid Profile: No results for input(s): CHOL, HDL, LDLCALC, TRIG, CHOLHDL, LDLDIRECT in the last 72 hours. Thyroid Function Tests: No results for input(s): TSH, T4TOTAL, FREET4, T3FREE, THYROIDAB in the last 72 hours. Anemia Panel: No results for input(s): VITAMINB12, FOLATE, FERRITIN, TIBC, IRON, RETICCTPCT in the last 72 hours. Urine analysis: No results found for: COLORURINE, APPEARANCEUR, LABSPEC, PHURINE, GLUCOSEU, HGBUR, BILIRUBINUR, KETONESUR, PROTEINUR, UROBILINOGEN, NITRITE, LEUKOCYTESUR  Radiological Exams on Admission: Dg Chest 2 View  Result Date: 09/25/2018 CLINICAL DATA:  Near syncopal episode.  Chest pain. EXAM: CHEST - 2 VIEW COMPARISON:  12/18/2017. FINDINGS: The heart size is stable and mildly enlarged. There is some atelectasis versus scarring in the lung bases bilaterally. There is no focal infiltrate. IMPRESSION: No  active cardiopulmonary disease. Electronically Signed   By: Constance Holster M.D.   On: 09/25/2018 13:37    EKG: Independently reviewed Vent. rate 77 BPM PR interval * ms QRS duration 119 ms QT/QTc 387/438 ms P-R-T axes 71 66 15 Sinus rhythm Consider left atrial enlargement Nonspecific intraventricular conduction delay  Assessment/Plan Principal Problem:   Unstable angina (HCC) Observation/telemetry. Continue aspirin, metoprolol and statin. Sublingual nitroglycerin as needed. Smoking cessation advised. Serial EKG. Check echocardiogram. Cardiology arranging for cath tomorrow.  Active Problems:   CAD (coronary artery disease) Continue beta-blocker, aspirin and statin.    Palpitations  Continue metoprolol. Optimize electrolytes. Quit smoking. Minimize alcohol consumption.    Tobacco abuse Patient is strongly encouraged to cease smoking. Nicotine  replacement therapy ordered. Staff to provide tobacco cessation information.    Alcohol use Advised to cease alcohol consideration. At least shoed decrease to less than 7 ounces of alcohol weekly.    GERD (gastroesophageal reflux disease) Pantoprazole 40 mg p.o. daily.    COPD (chronic obstructive pulmonary disease) (HCC) Smoking cessation advised. Supplemental oxygen as needed. Albuterol MDI every 6 hours as needed.    Hypertension Continue amlodipine 10 mg p.o. daily. Continue metoprolol 150 mg p.o. twice daily.    Hyperlipidemia Continue atorvastatin 80 mg p.o. daily. Monitor LFTs periodically. Check fasting lipid panel in the morning.     Atelectasis vs basal scaring Smoking cessation advised. Incentive spirometry every hour while awake.    DVT prophylaxis: Lovenox SQ. Code Status: Full code. Family Communication:  Disposition Plan: Observation for echo, cardio recommendations Consults called: Cardiology (Dr. Burman Freestone). Admission status: Observation/telemetry.   Reubin Milan MD [facility]  09/25/2018, 5:48 PM   This document was prepared using Dragon voice recognition software and may contain some unintended transcription errors.

## 2018-09-25 NOTE — Patient Instructions (Signed)
Your physician recommends that you schedule a follow-up appointment in: [place] AN EVALUATION AT [place]  Your physician has recommended you make the following change in your medication:   STOP LOSARTAN   START VALSARTAN 160 MG DAILY   Thank you for choosing [facility]!!

## 2018-09-25 NOTE — Consult Note (Signed)
Cardiology Consultation:   Patient ID: [id number] [id number]; [id number]; 1965-11-08   Admit date: 09/25/2018 Date of Consult: 09/25/2018  Primary Care Provider: Sinda Du, MD Primary Cardiologist: Carlyle Dolly, MD   Patient Profile:   Timothy Walker is a 53 y.o. male with a history of hypertension, GERD, OSA, COPD, and CAD status post inferior STEMI complicated by VF back in 2014 status post DES to the RCA who is being seen today for the evaluation of recent onset chest discomfort and exertional fatigue at the request of Dr. Olevia Bowens.  History of Present Illness:   Timothy Walker presents to the ER today having been referred there by Dr. Harl Bowie following a telehealth encounter today.  He states that he has been experiencing recent exertional fatigue both at work and while doing chores around the house, onset has been somewhat difficult to pinpoint but at least within the last few weeks. He works at [employer], has been carrying more lumbar around recently.  He also had more fatigue with weed eating and yard work on Monday.  On Monday evening while having a bowel movement, he states that he developed chest pressure without straining, subsequently feeling of lightheadedness and near syncope.  He required assistance to get out of the bathroom, subsequently became diaphoretic and presyncopal, although never had any frank syncope.  He continued to have chest heaviness, took nitroglycerin and ultimately went to sleep.  Since that time he has had some residual recurring chest heaviness, also a sense of vague palpitations.  He states that some of the symptoms remind him of his cardiac events back in 2014.  He reports compliance with his outpatient medications.  No recent orthopnea or PND.  No leg swelling.  On evaluation in the ER his ECG shows no acute ST segment changes and initial high-sensitivity troponin levels are normal.   Past Medical History:  Diagnosis Date  . COPD (chronic obstructive  pulmonary disease) ([place])   . Coronary artery disease    a. 12/2012 Inf STEMI/Cath/PCI: LM nl, LAD nl, D1 50ost, D2 min irregs, D3 small, LCX 80-21m, OM1/2/3 min irregs, RI 20p, RCA 50p/100d (3.5x18 Xience DEs),PDA/PL nl, EF 89% - complicated by VF/CGS/IABP/VDRF  . GERD (gastroesophageal reflux disease)   . Hiatal hernia   . Hypertension   . Ischemic cardiomyopathy    a. 12/2012 Echo: EF 45-50%, basal inf, inflat, mid inf HK, mildly reduced RV fxn.  . Marijuana abuse   . MI (myocardial infarction) ([place]) 2014  . Obstructive sleep apnea   . Tobacco abuse     Past Surgical History:  Procedure Laterality Date  . COLONOSCOPY WITH PROPOFOL N/A 05/14/2014   Dr. Gala Romney: anal canal hemorrhoid, rectosigmoid hyerpplastic polyp, right-sided divetriculosis   . CORONARY ANGIOPLASTY  01/13/2013   STENT TO RCA BY DR COOPER  . DENTAL SURGERY    . ESOPHAGEAL DILATION N/A 05/14/2014   Procedure: ESOPHAGEAL DILATION [place];  Surgeon: Daneil Dolin, MD;  Location: [facility];  Service: Endoscopy;  Laterality: N/A;  . ESOPHAGOGASTRODUODENOSCOPY (EGD) WITH PROPOFOL N/A 05/14/2014   Dr. Gala Romney: empiric dilation, abnormal esophagus s/p biopsy (normal), small hiatal hernia  . LEFT HEART CATHETERIZATION WITH CORONARY ANGIOGRAM N/A 01/13/2013   Procedure: LEFT HEART CATHETERIZATION WITH CORONARY ANGIOGRAM;  Surgeon: Wellington Hampshire, MD;  Location: [facility]ATH [facility];  Service: Cardiovascular;  Laterality: N/A;  . NASAL SEPTUM SURGERY    . PERCUTANEOUS CORONARY STENT INTERVENTION (PCI-S)  01/13/2013   Procedure: PERCUTANEOUS CORONARY STENT INTERVENTION (PCI-S);  Surgeon: Wellington Hampshire, MD;  Location: [facility] CATH LAB;  Service: Cardiovascular;;  . POLYPECTOMY  05/14/2014   Procedure: POLYPECTOMY;  Surgeon: Daneil Dolin, MD;  Location: [facility];  Service: Endoscopy;;     Outpatient Medications: No current facility-administered medications on file prior to encounter.    Current Outpatient Medications on File Prior to  Encounter  Medication Sig Dispense Refill  . acetaminophen (TYLENOL) 500 MG tablet Take 500 mg by mouth daily as needed for mild pain, moderate pain or headache.     . albuterol (PROVENTIL HFA;VENTOLIN HFA) 108 (90 Base) MCG/ACT inhaler Inhale 1-2 puffs into the lungs every 6 (six) hours as needed for wheezing or shortness of breath. 1 Inhaler 2  . amLODipine (NORVASC) 10 MG tablet TAKE ONE TABLET BY MOUTH ONCE DAILY 90 tablet 3  . aspirin 81 MG tablet Take 81 mg by mouth every morning.     Marland Kitchen atorvastatin (LIPITOR) 80 MG tablet TAKE 1 TABLET (80 MG TOTAL) BY MOUTH DAILY AT 6 PM. 30 tablet 6  . losartan (COZAAR) 100 MG tablet Take 100 mg by mouth daily.    . metoprolol tartrate (LOPRESSOR) 100 MG tablet Take 1 tablet (100 mg total) by mouth 2 (two) times daily. 60 tablet 3  . metoprolol tartrate (LOPRESSOR) 50 MG tablet TAKE 1 TABLET BY MOUTH TWICE A DAY 180 tablet 3  . nitroGLYCERIN (NITROSTAT) 0.4 MG SL tablet Place 1 tablet (0.4 mg total) under the tongue every 5 (five) minutes as needed for chest pain. 25 tablet 3  . pantoprazole (PROTONIX) 40 MG tablet Take 1 tablet (40 mg total) by mouth 2 (two) times daily. 60 tablet 3  . sildenafil (VIAGRA) 100 MG tablet Take 1 tablet (100 mg total) by mouth daily as needed for erectile dysfunction. 6 tablet 11  . tiotropium (SPIRIVA) 18 MCG inhalation capsule Place 1 capsule (18 mcg total) into inhaler and inhale daily. 30 capsule 3    Allergies:   No Known Allergies  Social History:   Social History   Socioeconomic History  . Marital status: Divorced    Spouse name: Not on file  . Number of children: Not on file  . Years of education: Not on file  . Highest education level: Not on file  Occupational History  . Not on file  Social Needs  . Financial resource strain: Not on file  . Food insecurity    Worry: Not on file    Inability: Not on file  . Transportation needs    Medical: Not on file    Non-medical: Not on file  Tobacco Use  .  Smoking status: Current Every Day Smoker    Packs/day: 1.50    Years: 31.00    Pack years: 46.50    Types: Cigarettes    Start date: 11/21/1981  . Smokeless tobacco: Never Used  Substance and Sexual Activity  . Alcohol use: Yes    Alcohol/week: 2.0 standard drinks    Types: 1 Cans of beer, 1 Shots of liquor per week    Comment: social  . Drug use: Yes    Types: Marijuana    Comment: last night  . Sexual activity: Yes  Lifestyle  . Physical activity    Days per week: Not on file    Minutes per session: Not on file  . Stress: Not on file  Relationships  . Social Herbalist on phone: Not on file    Gets together: Not on file  Attends religious service: Not on file    Active member of club or organization: Not on file    Attends meetings of clubs or organizations: Not on file    Relationship status: Not on file  . Intimate partner violence    Fear of current or ex partner: Not on file    Emotionally abused: Not on file    Physically abused: Not on file    Forced sexual activity: Not on file  Other Topics Concern  . Not on file  Social History Narrative  . Not on file    Family History:   The patient's family history includes Heart attack in an other family member; Hypertension in an other family member. There is no history of Colon cancer.  ROS:  Please see the history of present illness.  All other ROS reviewed and negative.     Physical Exam/Data:   Vitals:   09/25/18 1300 09/25/18 1330 09/25/18 1408 09/25/18 1430  BP: 127/85 (!) 134/95 (!) 128/93 (!) 129/97  Pulse: 73 73 76 72  Resp: (!) 21 (!) 21 (!) 22 (!) 21  Temp:      TempSrc:      SpO2: 95% 94% 98% 95%   No intake or output data in the 24 hours ending 09/25/18 1611 There were no vitals filed for this visit. There is no height or weight on file to calculate BMI.   Gen: Patient appears comfortable at rest. HEENT: Conjunctiva and lids normal, oropharynx clear. Neck: Supple, no elevated JVP  or carotid bruits, no thyromegaly. Lungs: Clear to auscultation, nonlabored breathing at rest. Cardiac: Regular rate and rhythm, no S3 or significant systolic murmur, no pericardial rub. Abdomen: Soft, nontender, bowel sounds present, no guarding or rebound. Extremities: No pitting edema, distal pulses 2+. Skin: Warm and dry. Musculoskeletal: No kyphosis. Neuropsychiatric: Alert and oriented x3, affect grossly appropriate.  EKG:  I personally reviewed the tracing from 09/25/2018 which shows sinus rhythm with IVCD.  Telemetry:  I personally reviewed telemetry which shows sinus rhythm.  Relevant CV Studies:  Cardiac catheterization 01/13/2013: Procedural Findings:  Hemodynamics: AO:   119/82   mmHg LV:   119/15     mmHg LVEDP: 28   mmHg  Coronary angiography: Coronary dominance: right    Left Main:   normal   Left Anterior Descending (LAD):   normal in size with minor irregularities throughout its course but no evidence of obstructive disease.   1st diagonal (D1):   small in size with 50% ostial stenosis.   2nd diagonal (D2):   small in size with minor irregularities.   3rd diagonal (D3):   very small in size.   Circumflex (LCx):   normal in size and nondominant. There is an 80-90% mid stenosis at the origin of OM 2.   1st obtuse marginal:   small in size with minor irregularities.   2nd obtuse marginal:   normal in size with no significant disease.   3rd obtuse marginal:   medium in size with no significant disease.     Ramus Intermedius:  normal in size with 20% proximal stenosis.   Right Coronary Artery:  large in size and dominant. There is 50% proximal stenosis. The vessel is occluded distally with large thrombus burden.  Posterior descending artery:  normal  Posterior AV segment:  normal   Posterolateral branchs:   normal  Left ventriculography: Left ventricular systolic function is  normal , LVEF is estimated at  55  %, there is  no  significant mitral  regurgitation .  mild inferior wall hypokinesis   PCI Procedure Note:  Following the diagnostic procedure, the decision was made to proceed with PCI. Weight-based bivalirudin was given for anticoagulation. Brilinta 180 mg was crushed and given through the OG tube.  Once a therapeutic ACT was achieved, a 6 [place] JR 4  guide catheter was inserted.  A  intuition  coronary guidewire was used to cross the lesion.  aspiration thrombectomy was performed with a Pronto catheter with retrieval of a large thrombus.   The lesion was then stented with a  3.5 x 8 mm Xience expedition drug-eluting  stent.  The stent was postdilated with a  4.0 x 15 mm  noncompliant balloon.  Following PCI, there was 0% residual stenosis and TIMI-3 flow. Final angiography confirmed an excellent result. Femoral hemostasis was achieved with Perclose device .  throughout the case, the patient was agitated which made the procedure very difficult. He was given escalating doses of propofol and Versed. He was hypotensive on presentation and was started on dopamine and Levophed.  A transvenous pacemaker was removed at the end of the case. The intra-aortic balloon pump was secured in place. The patient left the cath lab in critical but improved condition. I was able to stop Levophed.   PC  Data: Vessel - distal RCA/Segment -  3  Percent Stenosis (pre)   100% TIMI-flow 0  Stent  3.5 x 18 mm Xience expedition drug-eluting stent  Percent Stenosis (post)  0%  TIMI-flow (post)  3   Final Conclusions:   1. Refractory ventricular fibrillation and cardiogenic shock due to inferior ST elevation myocardial infarction. Occluded distal right coronary artery. There is significant bifurcation disease in the mid left circumflex supplying an overall small to medium sized territory.   2. Profound hypotension on presentation with subsequent improvement with revascularization and intra-aortic balloon pump placement. 3. Normal LV systolic function with  moderately elevated left ventricular end-diastolic pressure. 4. Successful PCI and drug-eluting stent placement to the right coronary artery.  Laboratory Data:  Chemistry Recent Labs  Lab 09/25/18 1216  NA 135  K 3.5  CL 101  CO2 24  GLUCOSE 100*  BUN 9  CREATININE 0.78  CALCIUM 9.4  GFRNONAA >60  GFRAA >60  ANIONGAP 10    Hematology Recent Labs  Lab 09/25/18 1216  WBC 9.6  RBC 4.46  HGB 14.8  HCT 44.8  MCV 100.4*  MCH 33.2  MCHC 33.0  RDW 13.2  PLT 299    Radiology/Studies:  Dg Chest 2 View  Result Date: 09/25/2018 CLINICAL DATA:  Near syncopal episode.  Chest pain. EXAM: CHEST - 2 VIEW COMPARISON:  12/18/2017. FINDINGS: The heart size is stable and mildly enlarged. There is some atelectasis versus scarring in the lung bases bilaterally. There is no focal infiltrate. IMPRESSION: No active cardiopulmonary disease. Electronically Signed   By: Constance Holster M.D.   On: 09/25/2018 13:37    Assessment and Plan:   1.  Recent onset recurring chest discomfort and exertional fatigue as well as an episode of presyncope and diaphoresis, discussed above.  Patient reports similarity in symptoms to previous cardiac event in 2014.  Initial high-sensitivity troponin levels are negative and his ECG shows no acute ST segment changes.  2.  CAD status post inferior STEMI complicated by VF status post DES to the RCA in 2014.  He reports compliance with his outpatient medications including aspirin, Lipitor, ARB, Lopressor, and as needed nitroglycerin which  he has not used in quite some time.  3.  Ischemic cardiomyopathy by history, LVEF 45 to 50% range as of echocardiogram in 2014.  4.  Mixed hyperlipidemia on statin therapy.  LDL was 59 as of 2017.  Records reviewed and case discussed with patient.  He is being admitted to the hospitalist service at [facility].  Would cycle a full set of high-sensitivity troponin levels and follow-up ECG in the morning.  Keep him n.p.o. after  midnight in anticipation of a diagnostic cardiac catheterization tomorrow at [facility].  We will contact the cardiac catheterization lab and get him on the schedule.  He will need an updated echocardiogram and lipid panel as well.  Signed, Rozann Lesches, MD  09/25/2018 4:11 PM

## 2018-09-25 NOTE — ED Provider Notes (Signed)
[facility] EMERGENCY DEPARTMENT Provider Note   CSN: [id number] Arrival date & time: 09/25/18  1155    History   Chief Complaint Chief Complaint  Patient presents with  . Chest Pain    HPI Timothy Walker is a 53 y.o. male with a  pmh of CAD with STEMI in 2014, DES to RCA. Presented at that time with VT/VF arrest and multiple defibrilations. He has LVEF of 45-50% and inferior wall hypokinesis. Monday (2 days ago) night the patient reports an episode of CP that began while having a BM. He began to feel light headed and presyncopal. He had associated Diaphoresis and SOB . He describes that pain as Burning. Pressure like, left sided  8/10 pain.The pain felt like an elephant on his chest and  Pain radiated down the left arm. He also had nausea and retching that lasted approximately 1 hour. He took asa NAD ntg without sig improvement.  Symptoms lasted approximately 1.5 hours. Since that time he has had palpitations and fatigue at work. CP with exertion. Denies hemoptysis, UL leg weakness. CV RF include Daily smoking, HTN, HLD, known CAD.  HPI: A 53 year old patient with a history of hypertension and hypercholesterolemia presents for evaluation of chest pain. Initial onset of pain was approximately 1-3 hours ago. The patient's chest pain is described as heaviness/pressure/tightness and is worse with exertion. The patient complains of nausea and reports some diaphoresis. The patient's chest pain is middle- or left-sided, is not well-localized, is not sharp and does radiate to the arms/jaw/neck. The patient has smoked in the past 90 days. The patient has no history of stroke, has no history of peripheral artery disease, denies any history of treated diabetes, has no relevant family history of coronary artery disease (first degree relative at less than age 78) and does not have an elevated BMI (>=30).   HPI  Past Medical History:  Diagnosis Date  . COPD (chronic obstructive pulmonary disease) ([place])    . Coronary artery disease    a. 12/2012 Inf STEMI/Cath/PCI: LM nl, LAD nl, D1 50ost, D2 min irregs, D3 small, LCX 80-20m, OM1/2/3 min irregs, RI 20p, RCA 50p/100d (3.5x18 Xience DEs),PDA/PL nl, EF 91% - complicated by VF/CGS/IABP/VDRF  . GERD (gastroesophageal reflux disease)   . Hiatal hernia   . Hypertension   . Ischemic cardiomyopathy    a. 12/2012 Echo: EF 45-50%, basal inf, inflat, mid inf HK, mildly reduced RV fxn.  . Marijuana abuse   . MI (myocardial infarction) ([place]) 2014  . Obstructive sleep apnea   . Tobacco abuse     Patient Active Problem List   Diagnosis Date Noted  . Unstable angina ([place]) 09/25/2018  . COPD (chronic obstructive pulmonary disease) ([place])   . Hypertension   . Hyperlipidemia   . Alcohol use   . Palpitations   . Anal condyloma 01/21/2018  . Bloating 01/21/2018  . GERD (gastroesophageal reflux disease) 01/14/2018  . Esophageal dysphagia 01/14/2018  . Hemorrhoids 10/01/2015  . Mucosal abnormality of esophagus   . Hepatomegaly 04/24/2014  . Dysphagia, pharyngoesophageal phase 04/24/2014  . Hematochezia 04/24/2014  . Ventricular fibrillation ([place]) 01/17/2013  . CAD (coronary artery disease) 01/17/2013  . Hypotension 01/17/2013  . ST elevation myocardial infarction (STEMI) of inferior wall, initial episode of care ([place]) 01/16/2013  . Ventricular tachycardia, sustained ([place]) 01/13/2013  . Acute and chronic respiratory failure 01/13/2013  . Tobacco abuse 01/13/2013  . Heart disease 01/13/2013  . Cardiogenic shock ([place]) 01/13/2013    Past Surgical History:  Procedure Laterality Date  . COLONOSCOPY WITH PROPOFOL N/A 05/14/2014   Dr. Gala Romney: anal canal hemorrhoid, rectosigmoid hyerpplastic polyp, right-sided divetriculosis   . CORONARY ANGIOPLASTY  01/13/2013   STENT TO RCA BY DR COOPER  . DENTAL SURGERY    . ESOPHAGEAL DILATION N/A 05/14/2014   Procedure: ESOPHAGEAL DILATION [place];  Surgeon: Daneil Dolin, MD;  Location: [facility];  Service:  Endoscopy;  Laterality: N/A;  . ESOPHAGOGASTRODUODENOSCOPY (EGD) WITH PROPOFOL N/A 05/14/2014   Dr. Gala Romney: empiric dilation, abnormal esophagus s/p biopsy (normal), small hiatal hernia  . LEFT HEART CATHETERIZATION WITH CORONARY ANGIOGRAM N/A 01/13/2013   Procedure: LEFT HEART CATHETERIZATION WITH CORONARY ANGIOGRAM;  Surgeon: Wellington Hampshire, MD;  Location: [facility] CATH LAB;  Service: Cardiovascular;  Laterality: N/A;  . NASAL SEPTUM SURGERY    . PERCUTANEOUS CORONARY STENT INTERVENTION (PCI-S)  01/13/2013   Procedure: PERCUTANEOUS CORONARY STENT INTERVENTION (PCI-S);  Surgeon: Wellington Hampshire, MD;  Location: [facility] CATH LAB;  Service: Cardiovascular;;  . POLYPECTOMY  05/14/2014   Procedure: POLYPECTOMY;  Surgeon: Daneil Dolin, MD;  Location: [facility];  Service: Endoscopy;;        Home Medications    Prior to Admission medications   Medication Sig Start Date End Date Taking? Authorizing Provider  acetaminophen (TYLENOL) 500 MG tablet Take 500 mg by mouth daily as needed for mild pain, moderate pain or headache.    Yes [provider]  albuterol (PROVENTIL HFA;VENTOLIN HFA) 108 (90 Base) MCG/ACT inhaler Inhale 1-2 puffs into the lungs every 6 (six) hours as needed for wheezing or shortness of breath. 01/18/16  Yes Arnoldo Lenis, MD  amLODipine (NORVASC) 10 MG tablet TAKE ONE TABLET BY MOUTH ONCE DAILY 03/12/18  Yes Branch, Alphonse Guild, MD  aspirin 81 MG tablet Take 81 mg by mouth every morning.    Yes [provider]  atorvastatin (LIPITOR) 80 MG tablet TAKE 1 TABLET (80 MG TOTAL) BY MOUTH DAILY AT 6 PM. 09/05/18  Yes Branch, Alphonse Guild, MD  losartan (COZAAR) 100 MG tablet Take 100 mg by mouth daily.   Yes [provider]  metoprolol tartrate (LOPRESSOR) 100 MG tablet Take 1 tablet (100 mg total) by mouth 2 (two) times daily. 07/02/18  Yes Branch, Alphonse Guild, MD  metoprolol tartrate (LOPRESSOR) 50 MG tablet TAKE 1 TABLET BY MOUTH TWICE A DAY 07/01/18  Yes Branch, Alphonse Guild, MD  nitroGLYCERIN (NITROSTAT) 0.4 MG SL tablet Place 1 tablet (0.4 mg total) under the tongue every 5 (five) minutes as needed for chest pain. 09/25/18  Yes Branch, Alphonse Guild, MD  pantoprazole (PROTONIX) 40 MG tablet Take 1 tablet (40 mg total) by mouth 2 (two) times daily. 07/02/18  Yes BranchAlphonse Guild, MD  sildenafil (VIAGRA) 100 MG tablet Take 1 tablet (100 mg total) by mouth daily as needed for erectile dysfunction. 01/14/16  Yes Arnoldo Lenis, MD  tiotropium (SPIRIVA) 18 MCG inhalation capsule Place 1 capsule (18 mcg total) into inhaler and inhale daily. 07/03/18  Yes Branch, Alphonse Guild, MD    Family History Family History  Problem Relation Age of Onset  . Heart attack Other   . Hypertension Other   . Colon cancer Neg Hx     Social History Social History   Tobacco Use  . Smoking status: Current Every Day Smoker    Packs/day: 1.50    Years: 31.00    Pack years: 46.50    Types: Cigarettes    Start date: 11/21/1981  .  Smokeless tobacco: Never Used  Substance Use Topics  . Alcohol use: Yes    Alcohol/week: 2.0 standard drinks    Types: 1 Cans of beer, 1 Shots of liquor per week    Comment: social  . Drug use: Yes    Types: Marijuana    Comment: last night     Allergies   Patient has no known allergies.   Review of Systems Review of Systems Ten systems reviewed and are negative for acute change, except as noted in the HPI.    Physical Exam Updated Vital Signs BP (!) 150/110 (BP Location: Left Arm)   Pulse 72   Temp 98 F (36.7 C) (Oral)   Resp 17   Ht 5\' 11"  (1.803 m)   Wt 82.3 kg   SpO2 97%   BMI 25.31 kg/m   Physical Exam Vitals signs and nursing note reviewed.  Constitutional:      General: He is not in acute distress.    Appearance: He is well-developed. He is not diaphoretic.  HENT:     Head: Normocephalic and atraumatic.  Eyes:     General: No scleral icterus.    Conjunctiva/sclera: Conjunctivae normal.  Neck:     Musculoskeletal:  Normal range of motion and neck supple.  Cardiovascular:     Rate and Rhythm: Normal rate and regular rhythm.     Heart sounds: Normal heart sounds. No murmur.  Pulmonary:     Effort: Pulmonary effort is normal. No respiratory distress.     Breath sounds: Normal breath sounds.  Abdominal:     Palpations: Abdomen is soft.     Tenderness: There is no abdominal tenderness.  Skin:    General: Skin is warm and dry.  Neurological:     Mental Status: He is alert.  Psychiatric:        Behavior: Behavior normal.      ED Treatments / Results  Labs (all labs ordered are listed, but only abnormal results are displayed) Labs Reviewed  BASIC METABOLIC PANEL - Abnormal; Notable for the following components:      Result Value   Glucose, Bld 100 (*)    All other components within normal limits  CBC - Abnormal; Notable for the following components:   MCV 100.4 (*)    All other components within normal limits  SARS CORONAVIRUS 2 (HOSPITAL ORDER, [facility])  TROPONIN I (HIGH SENSITIVITY)  TROPONIN I (HIGH SENSITIVITY)  MAGNESIUM  PHOSPHORUS  LIPID PANEL    EKG EKG Interpretation  Date/Time:  Wednesday September 25 2018 12:07:47 EDT Ventricular Rate:  77 PR Interval:    QRS Duration: 119 QT Interval:  387 QTC Calculation: 438 R Axis:   66 Text Interpretation:  Sinus rhythm Consider left atrial enlargement Nonspecific intraventricular conduction delay When compared with ECG of 11/28/2014 No significant change was found Confirmed by Francine Graven [id number]) on 09/25/2018 12:23:03 PM   Radiology Dg Chest 2 View  Result Date: 09/25/2018 CLINICAL DATA:  Near syncopal episode.  Chest pain. EXAM: CHEST - 2 VIEW COMPARISON:  12/18/2017. FINDINGS: The heart size is stable and mildly enlarged. There is some atelectasis versus scarring in the lung bases bilaterally. There is no focal infiltrate. IMPRESSION: No active cardiopulmonary disease. Electronically Signed   By:  Constance Holster M.D.   On: 09/25/2018 13:37    Procedures Procedures (including critical care time)  Medications Ordered in ED Medications  acetaminophen (TYLENOL) tablet 650 mg (has no administration in time  range)    Or  acetaminophen (TYLENOL) suppository 650 mg (has no administration in time range)  prochlorperazine (COMPAZINE) injection 5 mg (has no administration in time range)  enoxaparin (LOVENOX) injection 40 mg (40 mg Subcutaneous Given 09/25/18 1656)  ALPRAZolam (XANAX) tablet 0.25 mg (0.25 mg Oral Given 09/25/18 2036)  metoprolol tartrate (LOPRESSOR) tablet 50 mg (50 mg Oral Given 09/25/18 2037)  metoprolol tartrate (LOPRESSOR) tablet 100 mg (100 mg Oral Given 09/25/18 2035)  atorvastatin (LIPITOR) tablet 80 mg (80 mg Oral Given 09/25/18 2035)  aspirin chewable tablet 81 mg (has no administration in time range)  amLODipine (NORVASC) tablet 10 mg (10 mg Oral Given 09/25/18 2036)  albuterol (PROVENTIL) (2.5 MG/3ML) 0.083% nebulizer solution 2.5 mg (has no administration in time range)  nitroGLYCERIN (NITROSTAT) SL tablet 0.4 mg (has no administration in time range)  pantoprazole (PROTONIX) EC tablet 40 mg (40 mg Oral Given 09/25/18 2036)  nicotine (NICODERM CQ - dosed in mg/24 hours) patch 21 mg (21 mg Transdermal Patch Applied 09/25/18 2037)  thiamine (VITAMIN B-1) tablet 100 mg (100 mg Oral Given 09/25/18 2036)  sodium chloride flush (NS) 0.9 % injection 3 mL (3 mLs Intravenous Given 09/25/18 1230)  potassium chloride SA (K-DUR) CR tablet 40 mEq (40 mEq Oral Given 09/25/18 1654)  magnesium sulfate IVPB 2 g 50 mL (0 g Intravenous Duplicate 03/27/92 1740)     Initial Impression / Assessment and Plan / ED Course  I have reviewed the triage vital signs and the nursing notes.  Pertinent labs & imaging results that were available during my care of the patient were reviewed by me and considered in my medical decision making (see chart for details).  Clinical Course as of Sep 25 2222  Wed Sep 25, 2018  1405 Troponin I (High Sensitivity) [AH]  1502 I spoke with Dr. Domenic Polite who asks that we admit the patient here and he will consult on the patient    [AH]    Clinical Course User Index [AH] Margarita Mail, PA-C    HEAR Score: 70  Is a 53 year old male with significant coronary coronary history who presents with exertional chest pain, palpitations after concerning event that occurred 2 days ago.  Reviewed the patient's labs shows negative coronavirus, CBC shows macrocytosis without anemia, 2- high-sensitivity troponins, BMP shows slightly elevated blood glucose. I spoken with Dr. Domenic Polite of cardiology who asked that the patient be admitted here so he can evaluate him.  He will be admitted by Dr. Olevia Bowens. I reviewed the patient's EKG which is unchanged previous tracing.  I reviewed the patient's 2 view chest x-ray which shows no acute abnormalities.  Patient wishing to be admitted to the hospital and is agreeable with the plan.  Final Clinical Impressions(s) / ED Diagnoses   Final diagnoses:  None    ED Discharge Orders    None       Margarita Mail, PA-C 09/25/18 [facility], [place], DO 09/28/18 [phone]

## 2018-09-25 NOTE — ED Notes (Signed)
ED TO INPATIENT HANDOFF REPORT  ED Nurse Name and Phone #: Jamey Ripa [phone]  S Name/Age/Gender Timothy Walker 53 y.o. male Room/Bed: APA06/APA06  Code Status   Code Status: Full Code  Home/SNF/Other Home Patient oriented to: self, place, time and situation Is this baseline? Yes   Triage Complete: Triage complete  Chief Complaint THINKS HAD HEART ATTACK  Triage Note Patient reports he had a near syncopal episode on Monday night accompanied by diaphoresis, nausea, and chest pain. Patient has history of massive MI with defib and CPR. Reports continuing chest discomfort and feeling like his heart is out of rhythm.   Allergies No Known Allergies  Level of Care/Admitting Diagnosis ED Disposition    ED Disposition Condition Comment   Admit  Hospital Area: [facility] [[id number]]  Level of Care: Telemetry [5]  Covid Evaluation: Screening Protocol (No Symptoms)  Diagnosis: Unstable angina [facility]) [[id number]]  Admitting Physician: Reubin Milan [[id number]]  Attending Physician: Reubin Milan [[id number]]  PT Class (Do Not Modify): Observation [104]  PT Acc Code (Do Not Modify): Observation [10022]       B Medical/Surgery History Past Medical History:  Diagnosis Date  . COPD (chronic obstructive pulmonary disease) ([place])   . Coronary artery disease    a. 12/2012 Inf STEMI/Cath/PCI: LM nl, LAD nl, D1 50ost, D2 min irregs, D3 small, LCX 80-5m, OM1/2/3 min irregs, RI 20p, RCA 50p/100d (3.5x18 Xience DEs),PDA/PL nl, EF 92% - complicated by VF/CGS/IABP/VDRF  . GERD (gastroesophageal reflux disease)   . Hiatal hernia   . Hypertension   . Ischemic cardiomyopathy    a. 12/2012 Echo: EF 45-50%, basal inf, inflat, mid inf HK, mildly reduced RV fxn.  . Marijuana abuse   . MI (myocardial infarction) ([place]) 2014  . Obstructive sleep apnea   . Tobacco abuse    Past Surgical History:  Procedure Laterality Date  . COLONOSCOPY WITH PROPOFOL N/A 05/14/2014   Dr. Gala Romney: anal canal  hemorrhoid, rectosigmoid hyerpplastic polyp, right-sided divetriculosis   . CORONARY ANGIOPLASTY  01/13/2013   STENT TO RCA BY DR COOPER  . DENTAL SURGERY    . ESOPHAGEAL DILATION N/A 05/14/2014   Procedure: ESOPHAGEAL DILATION [place];  Surgeon: Daneil Dolin, MD;  Location: [facility];  Service: Endoscopy;  Laterality: N/A;  . ESOPHAGOGASTRODUODENOSCOPY (EGD) WITH PROPOFOL N/A 05/14/2014   Dr. Gala Romney: empiric dilation, abnormal esophagus s/p biopsy (normal), small hiatal hernia  . LEFT HEART CATHETERIZATION WITH CORONARY ANGIOGRAM N/A 01/13/2013   Procedure: LEFT HEART CATHETERIZATION WITH CORONARY ANGIOGRAM;  Surgeon: Wellington Hampshire, MD;  Location: [facility] CATH LAB;  Service: Cardiovascular;  Laterality: N/A;  . NASAL SEPTUM SURGERY    . PERCUTANEOUS CORONARY STENT INTERVENTION (PCI-S)  01/13/2013   Procedure: PERCUTANEOUS CORONARY STENT INTERVENTION (PCI-S);  Surgeon: Wellington Hampshire, MD;  Location: [facility] CATH LAB;  Service: Cardiovascular;;  . POLYPECTOMY  05/14/2014   Procedure: POLYPECTOMY;  Surgeon: Daneil Dolin, MD;  Location: [facility];  Service: Endoscopy;;     A IV Location/Drains/Wounds Patient Lines/Drains/Airways Status   Active Line/Drains/Airways    Name:   Placement date:   Placement time:   Site:   Days:   Peripheral IV 09/25/18 Right Antecubital   09/25/18    1220    Antecubital   less than 1          Intake/Output Last 24 hours No intake or output data in the 24 hours ending 09/25/18 1715  Labs/Imaging Results for orders placed or performed during  the hospital encounter of 09/25/18 (from the past 48 hour(s))  Basic metabolic panel     Status: Abnormal   Collection Time: 09/25/18 12:16 PM  Result Value Ref Range   Sodium 135 135 - 145 mmol/L   Potassium 3.5 3.5 - 5.1 mmol/L   Chloride 101 98 - 111 mmol/L   CO2 24 22 - 32 mmol/L   Glucose, Bld 100 (H) 70 - 99 mg/dL   BUN 9 6 - 20 mg/dL   Creatinine, Ser 0.78 0.61 - 1.24 mg/dL   Calcium 9.4 8.9 - 10.3 mg/dL    GFR calc non Af Amer >60 >60 mL/min   GFR calc Af Amer >60 >60 mL/min   Anion gap 10 5 - 15    Comment: Performed at [facility], [address] [place], [place] [address]  CBC     Status: Abnormal   Collection Time: 09/25/18 12:16 PM  Result Value Ref Range   WBC 9.6 4.0 - 10.5 K/uL   RBC 4.46 4.22 - 5.81 MIL/uL   Hemoglobin 14.8 13.0 - 17.0 g/dL   HCT 44.8 39.0 - 52.0 %   MCV 100.4 (H) 80.0 - 100.0 fL   MCH 33.2 26.0 - 34.0 pg   MCHC 33.0 30.0 - 36.0 g/dL   RDW 13.2 11.5 - 15.5 %   Platelets 299 150 - 400 K/uL   nRBC 0.0 0.0 - 0.2 %    Comment: Performed at [facility], [address], [place], [place] [address]  Troponin I (High Sensitivity)     Status: None   Collection Time: 09/25/18 12:16 PM  Result Value Ref Range   Troponin I (High Sensitivity) 3.00 <18 ng/L    Comment: (NOTE) Elevated high sensitivity troponin I (hsTnI) values and significant  changes across serial measurements Walker suggest ACS but many other  chronic and acute conditions are known to elevate hsTnI results.  Refer to the "Links" section for chest pain algorithms and additional  guidance. Performed at [facility], [address] [place], [place] [address]   Magnesium     Status: None   Collection Time: 09/25/18 12:16 PM  Result Value Ref Range   Magnesium 2.0 1.7 - 2.4 mg/dL    Comment: Performed at [facility], [address], [place], [place] [address]  SARS Coronavirus 2 (CEPHEID - Performed in [place] hospital lab), Hosp Order     Status: None   Collection Time: 09/25/18  1:49 PM   Specimen: Nasopharyngeal Swab  Result Value Ref Range   SARS Coronavirus 2 NEGATIVE NEGATIVE    Comment: (NOTE) If result is NEGATIVE SARS-CoV-2 target nucleic acids are NOT DETECTED. The SARS-CoV-2 RNA is generally detectable in upper and lower  respiratory specimens during the acute phase of infection. The lowest  concentration of SARS-CoV-2 viral copies this assay can detect is 250  copies / mL. A  negative result does not preclude SARS-CoV-2 infection  and should not be used as the sole basis for treatment or other  patient management decisions.  A negative result Walker occur with  improper specimen collection / handling, submission of specimen other  than nasopharyngeal swab, presence of viral mutation(s) within the  areas targeted by this assay, and inadequate number of viral copies  (<250 copies / mL). A negative result must be combined with clinical  observations, patient history, and epidemiological information. If result is POSITIVE SARS-CoV-2 target nucleic acids are DETECTED. The SARS-CoV-2 RNA is generally detectable in upper and lower  respiratory specimens dur ing the acute phase of infection.  Positive  results are indicative of active infection with SARS-CoV-2.  Clinical  correlation with patient history and other diagnostic information is  necessary to determine patient infection status.  Positive results do  not rule out bacterial infection or co-infection with other viruses. If result is PRESUMPTIVE POSTIVE SARS-CoV-2 nucleic acids Walker BE PRESENT.   A presumptive positive result was obtained on the submitted specimen  and confirmed on repeat testing.  While 2019 novel coronavirus  (SARS-CoV-2) nucleic acids Walker be present in the submitted sample  additional confirmatory testing Walker be necessary for epidemiological  and / or clinical management purposes  to differentiate between  SARS-CoV-2 and other Sarbecovirus currently known to infect humans.  If clinically indicated additional testing with an alternate test  methodology [phone]) is advised. The SARS-CoV-2 RNA is generally  detectable in upper and lower respiratory sp ecimens during the acute  phase of infection. The expected result is Negative. Fact Sheet for Patients:  StrictlyIdeas.no Fact Sheet for Healthcare Providers: BankingDealers.co.za This test is not  yet approved or cleared by the [place] FDA and has been authorized for detection and/or diagnosis of SARS-CoV-2 by FDA under an Emergency Use Authorization (EUA).  This EUA will remain in effect (meaning this test can be used) for the duration of the COVID-19 declaration under Section 564(b)(1) of the Act, 21 U.S.C. section 360bbb-3(b)(1), unless the authorization is terminated or revoked sooner. Performed at [facility], [address], [place], [place] [address]   Troponin I (High Sensitivity)     Status: None   Collection Time: 09/25/18  2:02 PM  Result Value Ref Range   Troponin I (High Sensitivity) 4.00 <18 ng/L    Comment: (NOTE) Elevated high sensitivity troponin I (hsTnI) values and significant  changes across serial measurements Walker suggest ACS but many other  chronic and acute conditions are known to elevate hsTnI results.  Refer to the "Links" section for chest pain algorithms and additional  guidance. Performed at [facility], [address] [place], [place] [address]    Dg Chest 2 View  Result Date: 09/25/2018 CLINICAL DATA:  Near syncopal episode.  Chest pain. EXAM: CHEST - 2 VIEW COMPARISON:  12/18/2017. FINDINGS: The heart size is stable and mildly enlarged. There is some atelectasis versus scarring in the lung bases bilaterally. There is no focal infiltrate. IMPRESSION: No active cardiopulmonary disease. Electronically Signed   By: Constance Holster M.D.   On: 09/25/2018 13:37    Pending Labs Unresulted Labs (From admission, onward)    Start     Ordered   10/02/18 0500  Creatinine, serum  (enoxaparin (LOVENOX)    CrCl >/= 30 ml/min)  Weekly,   R    Comments: while on enoxaparin therapy    09/25/18 1612   09/26/18 0500  HIV antibody (Routine Testing)  Tomorrow morning,   R     09/25/18 1612   09/25/18 1609  Phosphorus  Add-on,   AD     09/25/18 1608          Vitals/Pain Today's Vitals   09/25/18 1500 09/25/18 1530 09/25/18 1600 09/25/18 1630  BP:  (!) 142/100 (!) 135/93 (!) 159/96 (!) 160/105  Pulse: 76  71 73  Resp: (!) 23  (!) 22 20  Temp:      TempSrc:      SpO2: 96%  96% 98%  PainSc:        Isolation Precautions No active  isolations  Medications Medications  acetaminophen (TYLENOL) tablet 650 mg (has no administration in time range)    Or  acetaminophen (TYLENOL) suppository 650 mg (has no administration in time range)  prochlorperazine (COMPAZINE) injection 5 mg (has no administration in time range)  enoxaparin (LOVENOX) injection 40 mg (40 mg Subcutaneous Given 09/25/18 1656)  ALPRAZolam (XANAX) tablet 0.25 mg (has no administration in time range)  sodium chloride flush (NS) 0.9 % injection 3 mL (3 mLs Intravenous Given 09/25/18 1230)  potassium chloride SA (K-DUR) CR tablet 40 mEq (40 mEq Oral Given 09/25/18 1654)    Mobility walks Low fall risk   Focused Assessments    R Recommendations: See Admitting Provider Note  Report given to:   Additional Notes:

## 2018-09-25 NOTE — Progress Notes (Addendum)
Virtual Visit via Telephone Note   This visit type was conducted due to national recommendations for restrictions regarding the COVID-19 Pandemic (e.g. social distancing) in an effort to limit this patient's exposure and mitigate transmission in our community.  Due to his co-morbid illnesses, this patient is at least at moderate risk for complications without adequate follow up.  This format is felt to be most appropriate for this patient at this time.  The patient did not have access to video technology/had technical difficulties with video requiring transitioning to audio format only (telephone).  All issues noted in this document were discussed and addressed.  No physical exam could be performed with this format.  Please refer to the patient's chart for his  consent to telehealth for [facility].   Date:  09/25/2018   ID:  Timothy Walker, DOB Mar 20, 1966, MRN [id number]  Patient Location: Home Provider Location: Office  PCP:  Sinda Du, MD  Cardiologist:  Carlyle Dolly, MD  Electrophysiologist:  None   Evaluation Performed:  Follow-Up Visit  Chief Complaint:  Chest pain  History of Present Illness:    Timothy Walker is a 53 y.o. male seen today for follow up of the following medical problems. This is a focused visit on his history of CAD and recent chest pain, for more detailed history please refer to prior notes.   1. CAD  - admit to [facility] 01/13/13 to 01/17/13 with STEMI, DES to RCA.  - patient presented in cardiogenic shock, suffered VT/VF arrest requiring multiple defibrillations and IV amiodarone. Following PCI required balloon pump and vasopressors and temporary transvenous pacing.  - Echo LVEF 45-50%, inferior hypokinesis, normal diastolic function    - episode of chest pain on Monday night. Started while in restroom having a bowel movement. Felt lightheaded/dizzy, like passing out. Diaphoretic. Buring/stinging/pressure, left sided. 8/10 in severity. +SOB.  Severe dizziness with standing. Was able to lay down in bed. Starting having nausea, dry heaving. Symptoms lasted about 1 hour - took NG that was old and took ASA without much benefit.  - woke up around 5AM feeling better, though still some soreness.  - soreness since then comes and goes.  - fatigue at work, palpitations, pain with activity.  - reports symptoms similar to his 2014 MI but not as severe    The patient does not have symptoms concerning for COVID-19 infection (fever, chills, cough, or new shortness of breath).    Past Medical History:  Diagnosis Date   COPD (chronic obstructive pulmonary disease) ([place])    Coronary artery disease    a. 12/2012 Inf STEMI/Cath/PCI: LM nl, LAD nl, D1 50ost, D2 min irregs, D3 small, LCX 80-33m, OM1/2/3 min irregs, RI 20p, RCA 50p/100d (3.5x18 Xience DEs),PDA/PL nl, EF 96% - complicated by VF/CGS/IABP/VDRF   GERD (gastroesophageal reflux disease)    Hiatal hernia    Hypertension    Ischemic cardiomyopathy    a. 12/2012 Echo: EF 45-50%, basal inf, inflat, mid inf HK, mildly reduced RV fxn.   Marijuana abuse    MI (myocardial infarction) ([place]) 2014   Obstructive sleep apnea    Tobacco abuse    Past Surgical History:  Procedure Laterality Date   COLONOSCOPY WITH PROPOFOL N/A 05/14/2014   Dr. Gala Romney: anal canal hemorrhoid, rectosigmoid hyerpplastic polyp, right-sided divetriculosis    CORONARY ANGIOPLASTY  01/13/2013   STENT TO RCA BY DR [facility]   DENTAL SURGERY     ESOPHAGEAL DILATION N/A 05/14/2014   Procedure: ESOPHAGEAL DILATION 56  FRENCH MALONEY;  Surgeon: Daneil Dolin, MD;  Location: [facility];  Service: Endoscopy;  Laterality: N/A;   ESOPHAGOGASTRODUODENOSCOPY (EGD) WITH PROPOFOL N/A 05/14/2014   Dr. Gala Romney: empiric dilation, abnormal esophagus s/p biopsy (normal), small hiatal hernia   LEFT HEART CATHETERIZATION WITH CORONARY ANGIOGRAM N/A 01/13/2013   Procedure: LEFT HEART CATHETERIZATION WITH CORONARY ANGIOGRAM;  Surgeon:  Wellington Hampshire, MD;  Location: [place] CATH LAB;  Service: Cardiovascular;  Laterality: N/A;   NASAL SEPTUM SURGERY     PERCUTANEOUS CORONARY STENT INTERVENTION (PCI-S)  01/13/2013   Procedure: PERCUTANEOUS CORONARY STENT INTERVENTION (PCI-S);  Surgeon: Wellington Hampshire, MD;  Location: [facility] CATH LAB;  Service: Cardiovascular;;   POLYPECTOMY  05/14/2014   Procedure: POLYPECTOMY;  Surgeon: Daneil Dolin, MD;  Location: [facility];  Service: Endoscopy;;     No outpatient medications have been marked as taking for the 09/25/18 encounter (Appointment) with Arnoldo Lenis, MD.     Allergies:   Patient has no known allergies.   Social History   Tobacco Use   Smoking status: Current Every Day Smoker    Packs/day: 1.50    Years: 31.00    Pack years: 46.50    Types: Cigarettes    Start date: 11/21/1981   Smokeless tobacco: Never Used  Substance Use Topics   Alcohol use: Yes    Alcohol/week: 2.0 standard drinks    Types: 1 Cans of beer, 1 Shots of liquor per week    Comment: social   Drug use: Not Currently    Types: Marijuana     Family Hx: The patient's family history is negative for Colon cancer.  ROS:   Please see the history of present illness.     All other systems reviewed and are negative.   Prior CV studies:   The following studies were reviewed today:  Cath 12/2012 Hemodynamics: AO: 119/82 mmHg  LV: 119/15 mmHg  LVEDP: 28 mmHg  Coronary angiography: Coronary dominance: right   Left Main: normal   Left Anterior Descending (LAD): normal in size with minor irregularities throughout its course but no evidence of obstructive disease.   1st diagonal (D1): small in size with 50% ostial stenosis.   2nd diagonal (D2): small in size with minor irregularities.   3rd diagonal (D3): very small in size.   Circumflex (LCx): normal in size and nondominant. There is an 80-90% mid stenosis at the origin of OM 2.   1st obtuse marginal: small in size with minor  irregularities.   2nd obtuse marginal: normal in size with no significant disease.   3rd obtuse marginal: medium in size with no significant disease.   Ramus Intermedius: normal in size with 20% proximal stenosis.   Right Coronary Artery: large in size and dominant. There is 50% proximal stenosis. The vessel is occluded distally with large thrombus burden.   Posterior descending artery: normal   Posterior AV segment: normal   Posterolateral branchs: normal Left ventriculography: Left ventricular systolic function is normal , LVEF is estimated at 55 %, there is no significant mitral regurgitation . mild inferior wall hypokinesis  PCI Data: Vessel - distal RCA/Segment - 3  Percent Stenosis (pre) 100%  TIMI-flow 0  Stent 3.5 x 18 mm Xience expedition drug-eluting stent  Percent Stenosis (post) 0%  TIMI-flow (post) 3  Final Conclusions:  1. Refractory ventricular fibrillation and cardiogenic shock due to inferior ST elevation myocardial infarction. Occluded distal right coronary artery. There is significant bifurcation disease in the mid left circumflex supplying  an overall small to medium sized territory.  2. Profound hypotension on presentation with subsequent improvement with revascularization and intra-aortic balloon pump placement. 3. Normal LV systolic function with moderately elevated left ventricular end-diastolic pressure. 4. Successful PCI and drug-eluting stent placement to the right coronary artery.  01/13/13 Echo:  LVEF 45-50%, inferior hypokinesis, normal diastolic function   65/79/03 14 day event monitor: no significant arrhythmias, palpitations correlate with sinus rhythm, occasional PVCs.   02/2014 PFTs + obstruction, + COPD   Labs/Other Tests and Data Reviewed:    EKG:  n/a  Recent Labs: 01/14/2018: ALT 16; BUN 7; Creat 0.90; Hemoglobin 14.8; Platelets 299; Potassium 4.4; Sodium 138   Recent Lipid Panel Lab Results  Component  Value Date/Time   CHOL 148 01/13/2013 08:16 AM   TRIG 178 (H) 01/13/2013 08:16 AM   HDL 31 (L) 01/13/2013 08:16 AM   CHOLHDL 4.8 01/13/2013 08:16 AM   LDLCALC 81 01/13/2013 08:16 AM    Wt Readings from Last 3 Encounters:  01/14/18 190 lb 12.8 oz (86.5 kg)  07/09/17 196 lb (88.9 kg)  01/14/16 187 lb (84.8 kg)     Objective:    Vital Signs:  There were no vitals taken for this visit.  Normal affect. NOrmal speech pattern and tone. Comfortable, no apparent distress. NO audible signs of SOB or wheezing    ASSESSMENT & PLAN:    1. CAD  -severe chest pain episode Monday night with ongoing intermittent symptoms. Did not seek medical care Monday night.  - symptoms similar to his prior MI in 2014 - have asked him to present to the ER for evaluation. At a minimum would require overnight rule out, if EKG/trop changes or ongoing symptoms may require inpatient cath.     Trouble getting losartan from his pharmacy, we will d/c and start valsartan 160mg  daily    COVID-19 Education: The signs and symptoms of COVID-19 were discussed with the patient and how to seek care for testing (follow up with PCP or arrange E-visit).  The importance of social distancing was discussed today.  Time:   Today, I have spent 18 minutes with the patient with telehealth technology discussing the above problems.     Medication Adjustments/Labs and Tests Ordered: Current medicines are reviewed at length with the patient today.  Concerns regarding medicines are outlined above.   Tests Ordered: No orders of the defined types were placed in this encounter.   Medication Changes: No orders of the defined types were placed in this encounter.   Follow Up:  Pending ER evaluation  Signed, Carlyle Dolly, MD  09/25/2018 9:04 AM    [place]

## 2018-09-25 NOTE — ED Notes (Signed)
Primary contact per pt is his girlfriend Joseph Art [phone]

## 2018-09-25 NOTE — Addendum Note (Signed)
Addended by: Julian Hy T on: 09/25/2018 10:55 AM   Modules accepted: Orders

## 2018-09-25 NOTE — ED Triage Notes (Signed)
Patient reports he had a near syncopal episode on Monday night accompanied by diaphoresis, nausea, and chest pain. Patient has history of massive MI with defib and CPR. Reports continuing chest discomfort and feeling like his heart is out of rhythm.

## 2018-09-26 ENCOUNTER — Encounter (HOSPITAL_COMMUNITY)
Admission: EM | Disposition: A | Discharge status: Home / Self Care | Payer: Self-pay | Source: Home / Self Care | Attending: Emergency Medicine

## 2018-09-26 ENCOUNTER — Observation Stay (HOSPITAL_BASED_OUTPATIENT_CLINIC_OR_DEPARTMENT_OTHER): Payer: BC Managed Care – PPO

## 2018-09-26 DIAGNOSIS — I340 Nonrheumatic mitral (valve) insufficiency: Secondary | ICD-10-CM | POA: Diagnosis not present

## 2018-09-26 DIAGNOSIS — E782 Mixed hyperlipidemia: Secondary | ICD-10-CM | POA: Diagnosis not present

## 2018-09-26 DIAGNOSIS — I25110 Atherosclerotic heart disease of native coronary artery with unstable angina pectoris: Secondary | ICD-10-CM | POA: Diagnosis not present

## 2018-09-26 DIAGNOSIS — K219 Gastro-esophageal reflux disease without esophagitis: Secondary | ICD-10-CM

## 2018-09-26 DIAGNOSIS — I25118 Atherosclerotic heart disease of native coronary artery with other forms of angina pectoris: Secondary | ICD-10-CM

## 2018-09-26 DIAGNOSIS — I200 Unstable angina: Secondary | ICD-10-CM | POA: Diagnosis not present

## 2018-09-26 DIAGNOSIS — I10 Essential (primary) hypertension: Secondary | ICD-10-CM

## 2018-09-26 HISTORY — PX: LEFT HEART CATH AND CORONARY ANGIOGRAPHY: CATH118249

## 2018-09-26 LAB — LIPID PANEL
Cholesterol: 131 mg/dL (ref 0–200)
HDL: 29 mg/dL — ABNORMAL LOW (ref >40)
LDL Cholesterol: 57 mg/dL (ref 0–99)
Total CHOL/HDL Ratio: 4.5 RATIO
Triglycerides: 227 mg/dL — ABNORMAL HIGH (ref <150)
VLDL: 45 mg/dL — ABNORMAL HIGH (ref 0–40)

## 2018-09-26 LAB — ECHOCARDIOGRAM COMPLETE
Height: 71 in
Weight: 2959.46 oz

## 2018-09-26 SURGERY — LEFT HEART CATH AND CORONARY ANGIOGRAPHY
Anesthesia: LOCAL

## 2018-09-26 MED ORDER — ASPIRIN 81 MG PO CHEW
81.0000 mg | CHEWABLE_TABLET | ORAL | Status: AC
  Administered 2018-09-26: 81 mg via ORAL
  Filled 2018-09-26: qty 1

## 2018-09-26 MED ORDER — HYDRALAZINE HCL 20 MG/ML IJ SOLN
INTRAMUSCULAR | Status: AC
  Filled 2018-09-26: qty 1

## 2018-09-26 MED ORDER — FENTANYL CITRATE (PF) 100 MCG/2ML IJ SOLN
INTRAMUSCULAR | Status: AC
  Filled 2018-09-26: qty 2

## 2018-09-26 MED ORDER — SODIUM CHLORIDE 0.9 % WEIGHT BASED INFUSION
3.0000 mL/kg/h | INTRAVENOUS | Status: AC
  Administered 2018-09-26: 3 mL/kg/h via INTRAVENOUS

## 2018-09-26 MED ORDER — SODIUM CHLORIDE 0.9 % WEIGHT BASED INFUSION: 1.0000 mL/kg/h | INTRAVENOUS | Status: DC

## 2018-09-26 MED ORDER — SODIUM CHLORIDE 0.9% FLUSH: 3.0000 mL | Freq: Two times a day (BID) | INTRAVENOUS | Status: DC

## 2018-09-26 MED ORDER — NITROGLYCERIN 1 MG/10 ML FOR IR/CATH LAB
INTRA_ARTERIAL | Status: AC
  Filled 2018-09-26: qty 10

## 2018-09-26 MED ORDER — SODIUM CHLORIDE 0.9 % IV SOLN: 250.0000 mL | INTRAVENOUS | Status: DC | PRN

## 2018-09-26 MED ORDER — LABETALOL HCL 5 MG/ML IV SOLN: 10.0000 mg | INTRAVENOUS | Status: DC | PRN

## 2018-09-26 MED ORDER — SODIUM CHLORIDE 0.9% FLUSH: 3.0000 mL | INTRAVENOUS | Status: DC | PRN

## 2018-09-26 MED ORDER — NICOTINE 21 MG/24HR TD PT24: 21.0000 mg | MEDICATED_PATCH | Freq: Every day | TRANSDERMAL | 0 refills | Status: DC

## 2018-09-26 MED ORDER — FENTANYL CITRATE (PF) 100 MCG/2ML IJ SOLN
INTRAMUSCULAR | Status: DC | PRN
  Administered 2018-09-26: 25 ug via INTRAVENOUS

## 2018-09-26 MED ORDER — HEPARIN (PORCINE) IN NACL 1000-0.9 UT/500ML-% IV SOLN
INTRAVENOUS | Status: DC | PRN
  Administered 2018-09-26 (×2): 500 mL

## 2018-09-26 MED ORDER — VERAPAMIL HCL 2.5 MG/ML IV SOLN
INTRAVENOUS | Status: AC
  Filled 2018-09-26: qty 2

## 2018-09-26 MED ORDER — HEPARIN SODIUM (PORCINE) 1000 UNIT/ML IJ SOLN
INTRAMUSCULAR | Status: DC | PRN
  Administered 2018-09-26: 4000 [IU] via INTRAVENOUS

## 2018-09-26 MED ORDER — SODIUM CHLORIDE 0.9 % IV SOLN: INTRAVENOUS | Status: DC

## 2018-09-26 MED ORDER — ONDANSETRON HCL 4 MG/2ML IJ SOLN: 4.0000 mg | Freq: Four times a day (QID) | INTRAMUSCULAR | Status: DC | PRN

## 2018-09-26 MED ORDER — IOHEXOL 350 MG/ML SOLN
INTRAVENOUS | Status: DC | PRN
  Administered 2018-09-26: 45 mL via INTRACARDIAC

## 2018-09-26 MED ORDER — MIDAZOLAM HCL 2 MG/2ML IJ SOLN
INTRAMUSCULAR | Status: AC
  Filled 2018-09-26: qty 2

## 2018-09-26 MED ORDER — HYDRALAZINE HCL 20 MG/ML IJ SOLN
10.0000 mg | INTRAMUSCULAR | Status: DC | PRN
  Administered 2018-09-26: 10 mg via INTRAVENOUS

## 2018-09-26 MED ORDER — HEPARIN (PORCINE) IN NACL 1000-0.9 UT/500ML-% IV SOLN
INTRAVENOUS | Status: AC
  Filled 2018-09-26: qty 1000

## 2018-09-26 MED ORDER — LIDOCAINE HCL (PF) 1 % IJ SOLN
INTRAMUSCULAR | Status: DC | PRN
  Administered 2018-09-26: 2 mL via SUBCUTANEOUS

## 2018-09-26 MED ORDER — MIDAZOLAM HCL 2 MG/2ML IJ SOLN
INTRAMUSCULAR | Status: DC | PRN
  Administered 2018-09-26: 1 mg via INTRAVENOUS

## 2018-09-26 MED ORDER — VERAPAMIL HCL 2.5 MG/ML IV SOLN
INTRA_ARTERIAL | Status: DC | PRN
  Administered 2018-09-26: 10 mL via INTRA_ARTERIAL

## 2018-09-26 MED ORDER — LIDOCAINE HCL (PF) 1 % IJ SOLN
INTRAMUSCULAR | Status: AC
  Filled 2018-09-26: qty 30

## 2018-09-26 SURGICAL SUPPLY — 12 items
CATH INFINITI 5FR ANG PIGTAIL (CATHETERS) ×1 IMPLANT
CATH OPTITORQUE TIG 4.0 5F (CATHETERS) ×2 IMPLANT
DEVICE RAD COMP TR BAND LRG (VASCULAR PRODUCTS) ×1 IMPLANT
GLIDESHEATH SLEND A-KIT 6F 22G (SHEATH) ×2 IMPLANT
GUIDEWIRE INQWIRE 1.5J.035X260 (WIRE) ×1 IMPLANT
INQWIRE 1.5J .035X260CM (WIRE) ×2
KIT HEART LEFT (KITS) ×2 IMPLANT
PACK CARDIAC CATHETERIZATION (CUSTOM PROCEDURE TRAY) ×2 IMPLANT
SYR MEDRAD MARK 7 150ML (SYRINGE) ×2 IMPLANT
TRANSDUCER W/STOPCOCK (MISCELLANEOUS) ×2 IMPLANT
TUBING CIL FLEX 10 FLL-RA (TUBING) ×2 IMPLANT
WIRE HI TORQ VERSACORE-J 145CM (WIRE) ×2 IMPLANT

## 2018-09-26 NOTE — Discharge Summary (Addendum)
Discharge Summary    Patient ID: Timothy Walker MRN: [id number]; DOB: 1965/11/06  Admit date: 09/25/2018 Discharge date: 09/26/2018  Primary Care Provider: Sinda Du, MD  Primary Cardiologist: Carlyle Dolly, MD   Discharge Diagnoses    Principal Problem:   Unstable angina [facility]) Active Problems:   Tobacco abuse   CAD (coronary artery disease)   GERD (gastroesophageal reflux disease)   COPD (chronic obstructive pulmonary disease) ([place])   Hypertension   Hyperlipidemia   Alcohol use   Palpitations  Allergies No Known Allergies  Diagnostic Studies/Procedures    Echocardiogram 09/26/2018:  1. The left ventricle has mildly reduced systolic function, with an ejection fraction of 45-50%. The cavity size was mildly dilated. There is mildly increased left ventricular wall thickness. Left ventricular diastolic Doppler parameters are consistent  with impaired relaxation.  2. There is akinesis of the left ventricular, basal-mid inferior wall.  3. The right ventricle has normal systolic function. The cavity was normal. There is no increase in right ventricular wall thickness. Right ventricular systolic pressure is mildly elevated with an estimated pressure of 39.8 mmHg.  4. The mitral valve is grossly normal. There is mild mitral regurgitation.  5. The tricuspid valve is grossly normal.  6. The aortic valve is tricuspid.  7. The aortic root is normal in size and structure.  8. The inferior vena cava was dilated in size with >50% respiratory variability.  Cardiac catheterization 09/26/2018:  IMPRESSION: Mr. Rinks distal dominant RCA stent was widely patent.  He had high-grade lesion in a nondominant circumflex similar as described at the cath in 2014.  He had moderate LV dysfunction with an EF in the 40% range and severe inferior hypokinesia.  I do not see a culprit vessel new since his previous cath.  He does have moderate LV dysfunction and medical therapy will will be  recommended for this.  The sheath was removed and a TR band was placed on the right wrist to achieve patent hemostasis.  The patient left the lab in stable condition.  He can be discharged home later today with TOC 7 followed by return office visit with Dr. Harl Bowie in 3 to 4 weeks.  History of Present Illness     53 y.o.malew/ PMH of CAD (s/p STEMI in 2014 with DES to RCA and complicated by VF), HTN, HLD, COPD, GERD, and OSA who had a telehealth visit with Dr. Harl Bowie on 09/25/2018 and ED evaluation was recommended after reports of worsening exertional fatigue and chest discomfort.He was admitted to [facility] His enzymes were negative. His EKG showed no acute changes however because of his symptoms and prior history of CAD he was transferred to [facility] for diagnostic cath performed 07/02/020.   Hospital Course     Cardiac catheterization performed on 09/26/2018 showed distal dominant RCA stent which was widely patent. There is a high-grade lesion in a nondominant circumflex similar as described at the cath in 2014 and moderate LV dysfunction with an EF in the 40% range and severe inferior hypokinesia. There was no culprit vessel identified since his previous cath. He does have moderate LV dysfunction with medical therapy recommended.   Other problems include:  1. Chest Discomfort and Fatigue concerning for Unstable Angina - presented with worsening chest discomfort and fatigue for the past 3-4 days. Reports symptoms resemble his prior MI in 2014. - HS Troponin values have been negative. EKG shows no acute ischemic changes.  - catheterization recommended at the time of admission and the patient  is in agreement for the procedure. Has been NPO since midnight. Started on IVF this AM. Engineer, maintenance (IT) and [facility] made aware of transfer.  - continue ASA, BB, and statin therapy.  2. CAD - s/p STEMI in 2014 with DES to RCA and complicated by VF. Plan for repeat catheterization later today. -  continue ASA, statin, and BB therapy.   3. Ischemic Cardiomyopathy - EF at 45-50% by echocardiogram in 2014. Repeat echo is pending for assessment of LV function. CXR showed no active cardiopulmonary disease and he appears euvolemic by examination. - continue BB therapy. Will plan to restart Losartan following catheterization.   4. HLD - repeat FLP shows total cholesterol of 131, HDL 29, and LDL 57. At goal of LDL less than 70. Continue Atorvastatin 80mg  daily.   5. HTN - BP has been at 127/85 - 161/110 within the past 24 hours.  - on Amlodipine 10mg  daily, Losartan 100mg  daily, and Lopressor 150mg  BID PTA. Will restart Losartan prior to discharge    6. Tobacco Use -Continues to smoke 1.0 - 1.5 ppd. Cessation advised. Nicotine patch in place.   Consultants: None   The patient was seen and examined by Dr. Gwenlyn Found who feels that he is stable and ready for discharge today after TR band removal and cath site stable.  _____________  Discharge Vitals Blood pressure (!) 153/97, pulse 74, temperature 98.2 F (36.8 C), resp. rate (!) 23, height 5\' 11"  (1.803 m), weight 83.9 kg, SpO2 98 %.  Filed Weights   09/25/18 1752 09/26/18 0700  Weight: 82.3 kg 83.9 kg   Labs & Radiologic Studies    CBC Recent Labs    09/25/18 1216  WBC 9.6  HGB 14.8  HCT 44.8  MCV 100.4*  PLT 244   Basic Metabolic Panel Recent Labs    09/25/18 1216  NA 135  K 3.5  CL 101  CO2 24  GLUCOSE 100*  BUN 9  CREATININE 0.78  CALCIUM 9.4  MG 2.0  PHOS 3.9   Fasting Lipid Panel Recent Labs    09/26/18 0434  CHOL 131  HDL 29*  LDLCALC 57  TRIG 227*  CHOLHDL 4.5   ____________  Dg Chest 2 View  Result Date: 09/25/2018 CLINICAL DATA:  Near syncopal episode.  Chest pain. EXAM: CHEST - 2 VIEW COMPARISON:  12/18/2017. FINDINGS: The heart size is stable and mildly enlarged. There is some atelectasis versus scarring in the lung bases bilaterally. There is no focal infiltrate. IMPRESSION: No active  cardiopulmonary disease. Electronically Signed   By: Constance Holster M.D.   On: 09/25/2018 13:37   Disposition   Pt is being discharged home today in good condition.  Follow-up Plans & Appointments    Follow-up Information    Arnoldo Lenis, MD Follow up on 10/01/2018.   Specialty: Cardiology Why: Your follow up appoitment will be on 10/01/2018 at 140pm. Please arrive 80min prior to your appointmenmt time.  Contact information: [address] [place] [address] [phone]          Discharge Instructions    Call MD for:  difficulty breathing, headache or visual disturbances   Complete by: As directed    Call MD for:  extreme fatigue   Complete by: As directed    Call MD for:  hives   Complete by: As directed    Call MD for:  persistant dizziness or light-headedness   Complete by: As directed    Call MD for:  persistant nausea  and vomiting   Complete by: As directed    Call MD for:  redness, tenderness, or signs of infection (pain, swelling, redness, odor or green/yellow discharge around incision site)   Complete by: As directed    Call MD for:  severe uncontrolled pain   Complete by: As directed    Call MD for:  temperature >100.4   Complete by: As directed    Diet - low sodium heart healthy   Complete by: As directed    Discharge instructions   Complete by: As directed    No driving for 3 days. No lifting over 5 lbs for 1 week. No sexual activity for 1 week. Keep procedure site clean & dry. If you notice increased pain, swelling, bleeding or pus, call/return!  You may shower, but no soaking baths/hot tubs/pools for 1 week.   Increase activity slowly   Complete by: As directed      Discharge Medications   Allergies as of 09/26/2018   No Known Allergies     Medication List    TAKE these medications   acetaminophen 500 MG tablet Commonly known as: TYLENOL Take 500 mg by mouth daily as needed for mild pain, moderate pain or headache.   albuterol 108  (90 Base) MCG/ACT inhaler Commonly known as: VENTOLIN HFA Inhale 1-2 puffs into the lungs every 6 (six) hours as needed for wheezing or shortness of breath.   amLODipine 10 MG tablet Commonly known as: NORVASC TAKE ONE TABLET BY MOUTH ONCE DAILY   aspirin 81 MG tablet Take 81 mg by mouth every morning.   atorvastatin 80 MG tablet Commonly known as: LIPITOR TAKE 1 TABLET (80 MG TOTAL) BY MOUTH DAILY AT 6 PM.   losartan 100 MG tablet Commonly known as: COZAAR Take 100 mg by mouth daily.   metoprolol tartrate 50 MG tablet Commonly known as: LOPRESSOR TAKE 1 TABLET BY MOUTH TWICE A DAY   metoprolol tartrate 100 MG tablet Commonly known as: LOPRESSOR Take 1 tablet (100 mg total) by mouth 2 (two) times daily.   nicotine 21 mg/24hr patch Commonly known as: NICODERM CQ - dosed in mg/24 hours Place 1 patch (21 mg total) onto the skin daily. Start taking on: September 27, 2018   nitroGLYCERIN 0.4 MG SL tablet Commonly known as: Nitrostat Place 1 tablet (0.4 mg total) under the tongue every 5 (five) minutes as needed for chest pain.   pantoprazole 40 MG tablet Commonly known as: PROTONIX Take 1 tablet (40 mg total) by mouth 2 (two) times daily.   sildenafil 100 MG tablet Commonly known as: Viagra Take 1 tablet (100 mg total) by mouth daily as needed for erectile dysfunction.   tiotropium 18 MCG inhalation capsule Commonly known as: SPIRIVA Place 1 capsule (18 mcg total) into inhaler and inhale daily.        Acute coronary syndrome (MI, NSTEMI, STEMI, etc) this admission?: No.    Outstanding Labs/Studies   None   Duration of Discharge Encounter   Greater than 30 minutes including physician time.  Signed, Kathyrn Drown, NP 09/26/2018, 3:57 PM  Agree with note by Kathyrn Drown NP  Mr. Hassell Done was admitted with atypical chest pain.  His enzymes are negative and his EKG showed no acute changes.  He did have inferior STEMI in 2014.  His RCA stent is widely patent.  His  nondominant circumflex has unchanged anatomy and he has "no culprit lesions".  He does have moderate LV dysfunction with inferior severe hypokinesia.  I  cathed him radially.  He was stable for discharge home.  TOC 7, return office visit with Dr. Harl Bowie.    Lorretta Harp, M.D., [place], [facility], [place] [address] [place], [place]  [address]  [phone] 09/27/2018 8:08 AM

## 2018-09-26 NOTE — Discharge Instructions (Signed)
Radial Site Care  This sheet gives you information about how to care for yourself after your procedure. Your health care provider may also give you more specific instructions. If you have problems or questions, contact your health care provider. What can I expect after the procedure? After the procedure, it is common to have:  Bruising and tenderness at the catheter insertion area. Follow these instructions at home: Medicines  Take over-the-counter and prescription medicines only as told by your health care provider. Insertion site care  Follow instructions from your health care provider about how to take care of your insertion site. Make sure you: ? Wash your hands with soap and water before you change your bandage (dressing). If soap and water are not available, use hand sanitizer. ? Change your dressing as told by your health care provider. ? Leave stitches (sutures), skin glue, or adhesive strips in place. These skin closures may need to stay in place for 2 weeks or longer. If adhesive strip edges start to loosen and curl up, you may trim the loose edges. Do not remove adhesive strips completely unless your health care provider tells you to do that.  Check your insertion site every day for signs of infection. Check for: ? Redness, swelling, or pain. ? Fluid or blood. ? Pus or a bad smell. ? Warmth.  Do not take baths, swim, or use a hot tub until your health care provider approves.  You may shower 24-48 hours after the procedure, or as directed by your health care provider. ? Remove the dressing and gently wash the site with plain soap and water. ? Pat the area dry with a clean towel. ? Do not rub the site. That could cause bleeding.  Do not apply powder or lotion to the site. Activity   For 24 hours after the procedure, or as directed by your health care provider: ? Do not flex or bend the affected arm. ? Do not push or pull heavy objects with the affected arm. ? Do not  drive yourself home from the hospital or clinic. You may drive 24 hours after the procedure unless your health care provider tells you not to. ? Do not operate machinery or power tools.  Do not lift anything that is heavier than 10 lb (4.5 kg), or the limit that you are told, until your health care provider says that it is safe.  Ask your health care provider when it is okay to: ? Return to work or school. ? Resume usual physical activities or sports. ? Resume sexual activity. General instructions  If the catheter site starts to bleed, raise your arm and put firm pressure on the site. If the bleeding does not stop, get help right away. This is a medical emergency.  If you went home on the same day as your procedure, a responsible adult should be with you for the first 24 hours after you arrive home.  Keep all follow-up visits as told by your health care provider. This is important. Contact a health care provider if:  You have a fever.  You have redness, swelling, or yellow drainage around your insertion site. Get help right away if:  You have unusual pain at the radial site.  The catheter insertion area swells very fast.  The insertion area is bleeding, and the bleeding does not stop when you hold steady pressure on the area.  Your arm or hand becomes pale, cool, tingly, or numb. These symptoms may represent a serious problem   that is an emergency. Do not wait to see if the symptoms will go away. Get medical help right away. Call your local emergency services (911 in the [place] Do not drive yourself to the hospital. Summary  After the procedure, it is common to have bruising and tenderness at the site.  Follow instructions from your health care provider about how to take care of your radial site wound. Check the wound every day for signs of infection.  Do not lift anything that is heavier than 10 lb (4.5 kg), or the limit that you are told, until your health care provider says  that it is safe. This information is not intended to replace advice given to you by your health care provider. Make sure you discuss any questions you have with your health care provider. Document Released: 04/15/2010 Document Revised: 04/18/2017 Document Reviewed: 04/18/2017 Elsevier Patient Education  2020 [employer]  Moderate Conscious Sedation, Adult, Care After These instructions provide you with information about caring for yourself after your procedure. Your health care provider may also give you more specific instructions. Your treatment has been planned according to current medical practices, but problems sometimes occur. Call your health care provider if you have any problems or questions after your procedure. What can I expect after the procedure? After your procedure, it is common:  To feel sleepy for several hours.  To feel clumsy and have poor balance for several hours.  To have poor judgment for several hours.  To vomit if you eat too soon. Follow these instructions at home: For at least 24 hours after the procedure:   Do not: ? Participate in activities where you could fall or become injured. ? Drive. ? Use heavy machinery. ? Drink alcohol. ? Take sleeping pills or medicines that cause drowsiness. ? Make important decisions or sign legal documents. ? Take care of children on your own.  Rest. Eating and drinking  Follow the diet recommended by your health care provider.  If you vomit: ? Drink water, juice, or soup when you can drink without vomiting. ? Make sure you have little or no nausea before eating solid foods. General instructions  Have a responsible adult stay with you until you are awake and alert.  Take over-the-counter and prescription medicines only as told by your health care provider.  If you smoke, do not smoke without supervision.  Keep all follow-up visits as told by your health care provider. This is important. Contact a health care  provider if:  You keep feeling nauseous or you keep vomiting.  You feel light-headed.  You develop a rash.  You have a fever. Get help right away if:  You have trouble breathing. This information is not intended to replace advice given to you by your health care provider. Make sure you discuss any questions you have with your health care provider. Document Released: 01/01/2013 Document Revised: 02/23/2017 Document Reviewed: 07/03/2015 Elsevier Patient Education  2020 [employer] Inc.  

## 2018-09-26 NOTE — Plan of Care (Signed)

## 2018-09-26 NOTE — Progress Notes (Addendum)
Progress Note  Patient Name: Timothy Walker Date of Encounter: 09/26/2018  Primary Cardiologist: Carlyle Dolly, MD   Subjective   Denies any recurrent chest pain overnight or this morning. Still has a "funny feeling" in his chest. No dyspnea on exertion, orthopnea, or PND. Anxious about his catheterization later today. Has been NPO since midnight.   Inpatient Medications    Scheduled Meds: . amLODipine  10 mg Oral Daily  . aspirin  81 mg Oral q morning - 10a  . atorvastatin  80 mg Oral q1800  . enoxaparin (LOVENOX) injection  40 mg Subcutaneous Q24H  . metoprolol tartrate  100 mg Oral BID  . metoprolol tartrate  50 mg Oral BID  . nicotine  21 mg Transdermal Daily  . pantoprazole  40 mg Oral BID  . sodium chloride flush  3 mL Intravenous Q12H  . thiamine  100 mg Oral Daily   Continuous Infusions: . sodium chloride    . sodium chloride     PRN Meds: sodium chloride, acetaminophen **OR** acetaminophen, albuterol, ALPRAZolam, nitroGLYCERIN, prochlorperazine, sodium chloride flush   Vital Signs    Vitals:   09/25/18 1955 09/25/18 2034 09/26/18 0518 09/26/18 0700  BP:  (!) 150/110 (!) 146/96   Pulse:  72 68   Resp:  17 17   Temp:  98 F (36.7 C) 98.2 F (36.8 C)   TempSrc:  Oral    SpO2: 94% 97% 93%   Weight:    83.9 kg  Height:        Intake/Output Summary (Last 24 hours) at 09/26/2018 0733 Last data filed at 09/26/2018 0650 Gross per 24 hour  Intake 0 ml  Output -  Net 0 ml    Last 3 Weights 09/26/2018 09/25/2018 09/25/2018  Weight (lbs) 184 lb 15.5 oz 181 lb 7 oz 185 lb  Weight (kg) 83.9 kg 82.3 kg 83.915 kg      Telemetry    NSR, HR in 60's to 70's with occasional PVC's.  - Personally Reviewed  ECG    NSR, HR 74, with nonspecific IVCD. No acute ST changes when compared to prior tracings.  - Personally Reviewed  Physical Exam   General: Well developed, well nourished, male appearing in no acute distress. Head: Normocephalic, atraumatic.  Neck: Supple  without bruits, JVD not elevated. Lungs:  Resp regular and unlabored, mild expiratory wheezing. Heart: RRR, S1, S2, no S3, S4, or murmur; no rub. Abdomen: Soft, non-tender, non-distended with normoactive bowel sounds. No hepatomegaly. No rebound/guarding. No obvious abdominal masses. Extremities: No clubbing, cyanosis, or lower extremity edema. Distal pedal pulses are 2+ bilaterally. Neuro: Alert and oriented X 3. Moves all extremities spontaneously. Psych: Normal affect.  Labs    Chemistry Recent Labs  Lab 09/25/18 1216  NA 135  K 3.5  CL 101  CO2 24  GLUCOSE 100*  BUN 9  CREATININE 0.78  CALCIUM 9.4  GFRNONAA >60  GFRAA >60  ANIONGAP 10     Hematology Recent Labs  Lab 09/25/18 1216  WBC 9.6  RBC 4.46  HGB 14.8  HCT 44.8  MCV 100.4*  MCH 33.2  MCHC 33.0  RDW 13.2  PLT 299    Cardiac EnzymesNo results for input(s): TROPONINI in the last 168 hours. No results for input(s): TROPIPOC in the last 168 hours.   BNPNo results for input(s): BNP, PROBNP in the last 168 hours.   DDimer No results for input(s): DDIMER in the last 168 hours.   Radiology  Dg Chest 2 View  Result Date: 09/25/2018 CLINICAL DATA:  Near syncopal episode.  Chest pain. EXAM: CHEST - 2 VIEW COMPARISON:  12/18/2017. FINDINGS: The heart size is stable and mildly enlarged. There is some atelectasis versus scarring in the lung bases bilaterally. There is no focal infiltrate. IMPRESSION: No active cardiopulmonary disease. Electronically Signed   By: Constance Holster M.D.   On: 09/25/2018 13:37    Cardiac Studies   Echocardiogram: 2014 Study Conclusions   - Left ventricle: There is hypokinesis of the basal inferior  nd inferolateral and mid inferior walls. The cavity size  was normal. Wall thickness was normal. Systolic function  was mildly reduced. The estimated ejection fraction was in  the range of 45% to 50%. Left ventricular diastolic  function parameters were normal.  -  Right ventricle: The cavity size was mildly dilated.  Systolic function was mildly reduced.  - Atrial septum: No defect or patent foramen ovale was  identified.   Catheterization: 12/2012 Coronary dominance: right    Left Main:   normal   Left Anterior Descending (LAD):   normal in size with minor irregularities throughout its course but no evidence of obstructive disease.   1st diagonal (D1):   small in size with 50% ostial stenosis.   2nd diagonal (D2):   small in size with minor irregularities.   3rd diagonal (D3):   very small in size.   Circumflex (LCx):   normal in size and nondominant. There is an 80-90% mid stenosis at the origin of OM 2.   1st obtuse marginal:   small in size with minor irregularities.   2nd obtuse marginal:   normal in size with no significant disease.   3rd obtuse marginal:   medium in size with no significant disease.     Ramus Intermedius:  normal in size with 20% proximal stenosis.   Right Coronary Artery:  large in size and dominant. There is 50% proximal stenosis. The vessel is occluded distally with large thrombus burden.  Posterior descending artery:  normal  Posterior AV segment:  normal   Posterolateral branchs:   normal  Left ventriculography: Left ventricular systolic function is  normal , LVEF is estimated at  55  %, there is  no  significant mitral regurgitation .  mild inferior wall hypokinesis   PCI Procedure Note:  Following the diagnostic procedure, the decision was made to proceed with PCI. Weight-based bivalirudin was given for anticoagulation. Brilinta 180 mg was crushed and given through the OG tube.  Once a therapeutic ACT was achieved, a 6 [place] JR 4  guide catheter was inserted.  A  intuition  coronary guidewire was used to cross the lesion.  aspiration thrombectomy was performed with a Pronto catheter with retrieval of a large thrombus.   The lesion was then stented with a  3.5 x 8 mm Xience expedition drug-eluting   stent.  The stent was postdilated with a  4.0 x 15 mm  noncompliant balloon.  Following PCI, there was 0% residual stenosis and TIMI-3 flow. Final angiography confirmed an excellent result. Femoral hemostasis was achieved with Perclose device .  throughout the case, the patient was agitated which made the procedure very difficult. He was given escalating doses of propofol and Versed. He was hypotensive on presentation and was started on dopamine and Levophed.  A transvenous pacemaker was removed at the end of the case. The intra-aortic balloon pump was secured in place. The patient left the cath lab in critical  but improved condition. I was able to stop Levophed.   PC  Data: Vessel - distal RCA/Segment -  3  Percent Stenosis (pre)   100% TIMI-flow 0  Stent  3.5 x 18 mm Xience expedition drug-eluting stent  Percent Stenosis (post)  0%  TIMI-flow (post)  3   Final Conclusions:   1. Refractory ventricular fibrillation and cardiogenic shock due to inferior ST elevation myocardial infarction. Occluded distal right coronary artery. There is significant bifurcation disease in the mid left circumflex supplying an overall small to medium sized territory.   2. Profound hypotension on presentation with subsequent improvement with revascularization and intra-aortic balloon pump placement. 3. Normal LV systolic function with moderately elevated left ventricular end-diastolic pressure. 4. Successful PCI and drug-eluting stent placement to the right coronary artery.   Patient Profile     53 y.o. male w/ PMH of CAD (s/p STEMI in 2014 with DES to RCA and complicated by VF), HTN, HLD, COPD, GERD, and OSA who had a telehealth visit with Dr. Harl Bowie on 09/25/2018 and ED evaluation was recommended after reports of worsening exertional fatigue and chest discomfort.   Assessment & Plan    1. Chest Discomfort and Fatigue concerning for Unstable Angina - presented with worsening chest discomfort and fatigue for the  past 3-4 days. Reports symptoms resemble his prior MI in 2014. - HS Troponin values have been negative. EKG shows no acute ischemic changes.  - catheterization recommended at the time of admission and the patient is in agreement for the procedure. Has been NPO since midnight. Started on IVF this AM. Engineer, maintenance (IT) and [facility] made aware of transfer.  - continue ASA, BB, and statin therapy.  2. CAD - s/p STEMI in 2014 with DES to RCA and complicated by VF. Plan for repeat catheterization later today. - continue ASA, statin, and BB therapy.   3. Ischemic Cardiomyopathy - EF at 45-50% by echocardiogram in 2014. Repeat echo is pending for assessment of LV function. CXR showed no active cardiopulmonary disease and he appears euvolemic by examination. - continue BB therapy. Will plan to restart Losartan following catheterization.   4. HLD - repeat FLP shows total cholesterol of 131, HDL 29, and LDL 57. At goal of LDL less than 70. Continue Atorvastatin 80mg  daily.   5. HTN - BP has been at 127/85 - 161/110 within the past 24 hours.  - on Amlodipine 10mg  daily, Losartan 100mg  daily, and Lopressor 150mg  BID PTA. Losartan held in anticipation of cath.   6. Tobacco Use - continues to smoke 1.0 - 1.5 ppd. Cessation advised. Nicotine patch in place.   For questions or updates, please contact [place] Please consult [email or web address] for contact info under Cardiology/STEMI.   Arna Medici , PA-C 7:33 AM 09/26/2018 Pager: [phone]   Attending note:  Patient seen and examined.  Case discussed with Ms. Ahmed Prima PA-C.  Mr. Haik does not report any recurrent chest pain under observation.  Still has an intermittent sense of palpitations however his cardiac monitor has not revealed any arrhythmias, single PVC noted.  On examination he appears comfortable.  Afebrile, heart rate in the 60s to 70s in sinus rhythm by telemetry.  Blood pressure 140s over 90s.  Lungs are clear without  labored breathing.  Cardiac exam reveals RRR without gallop.  Lab work shows normal high-sensitivity troponin I levels, LDL 57.  Anticipate transfer to [facility] today for diagnostic cardiac catheterization.  Risks and benefits discussed and he is in agreement  to proceed.  He is currently n.p.o.  Satira Sark, M.D., F.A.C.C.

## 2018-09-26 NOTE — Progress Notes (Signed)
  Echocardiogram 2D Echocardiogram has been performed.  Timothy Walker 09/26/2018, 12:00 PM

## 2018-09-26 NOTE — Progress Notes (Signed)
Subjective: He was admitted yesterday with chest pain.  He has been having some stuttering chest pain at home.  He has had some dizziness and pressure.  He feels like he is having cardiac arrhythmias.  This is been going on for several days.  He has been more short of breath than usual and feels like he is not able to manage his normal activities as usual.  Objective: Vital signs in last 24 hours: Temp:  [98 F (36.7 C)-98.2 F (36.8 C)] 98.2 F (36.8 C) (07/02 0518) Pulse Rate:  [68-77] 68 (07/02 0518) Resp:  [14-25] 17 (07/02 0518) BP: (127-161)/(85-110) 146/96 (07/02 0518) SpO2:  [93 %-98 %] 93 % (07/02 0518) Weight:  [82.3 kg-83.9 kg] 83.9 kg (07/02 0700) Weight change:  Last BM Date: 09/24/18  Intake/Output from previous day: No intake/output data recorded.  PHYSICAL EXAM General appearance: alert, cooperative and no distress Resp: clear to auscultation bilaterally Cardio: regular rate and rhythm, S1, S2 normal, no murmur, click, rub or gallop GI: soft, non-tender; bowel sounds normal; no masses,  no organomegaly Extremities: extremities normal, atraumatic, no cyanosis or edema  Lab Results:  Results for orders placed or performed during the hospital encounter of 09/25/18 (from the past 48 hour(s))  Basic metabolic panel     Status: Abnormal   Collection Time: 09/25/18 12:16 PM  Result Value Ref Range   Sodium 135 135 - 145 mmol/L   Potassium 3.5 3.5 - 5.1 mmol/L   Chloride 101 98 - 111 mmol/L   CO2 24 22 - 32 mmol/L   Glucose, Bld 100 (H) 70 - 99 mg/dL   BUN 9 6 - 20 mg/dL   Creatinine, Ser 0.78 0.61 - 1.24 mg/dL   Calcium 9.4 8.9 - 10.3 mg/dL   GFR calc non Af Amer >60 >60 mL/min   GFR calc Af Amer >60 >60 mL/min   Anion gap 10 5 - 15    Comment: Performed at [facility], [address], [place], [place] [address]  CBC     Status: Abnormal   Collection Time: 09/25/18 12:16 PM  Result Value Ref Range   WBC 9.6 4.0 - 10.5 K/uL   RBC 4.46 4.22 - 5.81 MIL/uL    Hemoglobin 14.8 13.0 - 17.0 g/dL   HCT 44.8 39.0 - 52.0 %   MCV 100.4 (H) 80.0 - 100.0 fL   MCH 33.2 26.0 - 34.0 pg   MCHC 33.0 30.0 - 36.0 g/dL   RDW 13.2 11.5 - 15.5 %   Platelets 299 150 - 400 K/uL   nRBC 0.0 0.0 - 0.2 %    Comment: Performed at [facility], [address], [place], [place] [address]  Troponin I (High Sensitivity)     Status: None   Collection Time: 09/25/18 12:16 PM  Result Value Ref Range   Troponin I (High Sensitivity) 3.00 <18 ng/L    Comment: (NOTE) Elevated high sensitivity troponin I (hsTnI) values and significant  changes across serial measurements may suggest ACS but many other  chronic and acute conditions are known to elevate hsTnI results.  Refer to the "Links" section for chest pain algorithms and additional  guidance. Performed at [facility], [address], [place], [place] [address]   Magnesium     Status: None   Collection Time: 09/25/18 12:16 PM  Result Value Ref Range   Magnesium 2.0 1.7 - 2.4 mg/dL    Comment: Performed at [facility], [address] [place], [place] [address]  Phosphorus  Status: None   Collection Time: 09/25/18 12:16 PM  Result Value Ref Range   Phosphorus 3.9 2.5 - 4.6 mg/dL    Comment: Performed at [facility], [address], [place], [place] [address]  SARS Coronavirus 2 (CEPHEID - Performed in [place] hospital lab), Hosp Order     Status: None   Collection Time: 09/25/18  1:49 PM   Specimen: Nasopharyngeal Swab  Result Value Ref Range   SARS Coronavirus 2 NEGATIVE NEGATIVE    Comment: (NOTE) If result is NEGATIVE SARS-CoV-2 target nucleic acids are NOT DETECTED. The SARS-CoV-2 RNA is generally detectable in upper and lower  respiratory specimens during the acute phase of infection. The lowest  concentration of SARS-CoV-2 viral copies this assay can detect is 250  copies / mL. A negative result does not preclude SARS-CoV-2 infection  and should not be used as the sole basis for treatment or other   patient management decisions.  A negative result may occur with  improper specimen collection / handling, submission of specimen other  than nasopharyngeal swab, presence of viral mutation(s) within the  areas targeted by this assay, and inadequate number of viral copies  (<250 copies / mL). A negative result must be combined with clinical  observations, patient history, and epidemiological information. If result is POSITIVE SARS-CoV-2 target nucleic acids are DETECTED. The SARS-CoV-2 RNA is generally detectable in upper and lower  respiratory specimens dur ing the acute phase of infection.  Positive  results are indicative of active infection with SARS-CoV-2.  Clinical  correlation with patient history and other diagnostic information is  necessary to determine patient infection status.  Positive results do  not rule out bacterial infection or co-infection with other viruses. If result is PRESUMPTIVE POSTIVE SARS-CoV-2 nucleic acids MAY BE PRESENT.   A presumptive positive result was obtained on the submitted specimen  and confirmed on repeat testing.  While 2019 novel coronavirus  (SARS-CoV-2) nucleic acids may be present in the submitted sample  additional confirmatory testing may be necessary for epidemiological  and / or clinical management purposes  to differentiate between  SARS-CoV-2 and other Sarbecovirus currently known to infect humans.  If clinically indicated additional testing with an alternate test  methodology [phone]) is advised. The SARS-CoV-2 RNA is generally  detectable in upper and lower respiratory sp ecimens during the acute  phase of infection. The expected result is Negative. Fact Sheet for Patients:  StrictlyIdeas.no Fact Sheet for Healthcare Providers: BankingDealers.co.za This test is not yet approved or cleared by the [place] FDA and has been authorized for detection and/or diagnosis of SARS-CoV-2  by FDA under an Emergency Use Authorization (EUA).  This EUA will remain in effect (meaning this test can be used) for the duration of the COVID-19 declaration under Section 564(b)(1) of the Act, 21 U.S.C. section 360bbb-3(b)(1), unless the authorization is terminated or revoked sooner. Performed at [facility], [address], [place], [place] [address]   Troponin I (High Sensitivity)     Status: None   Collection Time: 09/25/18  2:02 PM  Result Value Ref Range   Troponin I (High Sensitivity) 4.00 <18 ng/L    Comment: (NOTE) Elevated high sensitivity troponin I (hsTnI) values and significant  changes across serial measurements may suggest ACS but many other  chronic and acute conditions are known to elevate hsTnI results.  Refer to the "Links" section for chest pain algorithms and additional  guidance. Performed at [facility], [address], [place], [place] [address]  Lipid panel     Status: Abnormal   Collection Time: 09/26/18  4:34 AM  Result Value Ref Range   Cholesterol 131 0 - 200 mg/dL   Triglycerides 227 (H) <150 mg/dL   HDL 29 (L) >40 mg/dL   Total CHOL/HDL Ratio 4.5 RATIO   VLDL 45 (H) 0 - 40 mg/dL   LDL Cholesterol 57 0 - 99 mg/dL    Comment:        Total Cholesterol/HDL:CHD Risk Coronary Heart Disease Risk Table                     Men   Women  1/2 Average Risk   3.4   3.3  Average Risk       5.0   4.4  2 X Average Risk   9.6   7.1  3 X Average Risk  23.4   11.0        Use the calculated Patient Ratio above and the CHD Risk Table to determine the patient's CHD Risk.        ATP III CLASSIFICATION (LDL):  <100     mg/dL   Optimal  100-129  mg/dL   Near or Above                    Optimal  130-159  mg/dL   Borderline  160-189  mg/dL   High  >190     mg/dL   Very High Performed at [facility] [place], [place] [address]     ABGS No results for input(s): PHART, PO2ART, TCO2, HCO3 in the last 72 hours.  Invalid input(s):  PCO2 CULTURES Recent Results (from the past 240 hour(s))  SARS Coronavirus 2 (CEPHEID - Performed in [place] hospital lab), Hosp Order     Status: None   Collection Time: 09/25/18  1:49 PM   Specimen: Nasopharyngeal Swab  Result Value Ref Range Status   SARS Coronavirus 2 NEGATIVE NEGATIVE Final    Comment: (NOTE) If result is NEGATIVE SARS-CoV-2 target nucleic acids are NOT DETECTED. The SARS-CoV-2 RNA is generally detectable in upper and lower  respiratory specimens during the acute phase of infection. The lowest  concentration of SARS-CoV-2 viral copies this assay can detect is 250  copies / mL. A negative result does not preclude SARS-CoV-2 infection  and should not be used as the sole basis for treatment or other  patient management decisions.  A negative result may occur with  improper specimen collection / handling, submission of specimen other  than nasopharyngeal swab, presence of viral mutation(s) within the  areas targeted by this assay, and inadequate number of viral copies  (<250 copies / mL). A negative result must be combined with clinical  observations, patient history, and epidemiological information. If result is POSITIVE SARS-CoV-2 target nucleic acids are DETECTED. The SARS-CoV-2 RNA is generally detectable in upper and lower  respiratory specimens dur ing the acute phase of infection.  Positive  results are indicative of active infection with SARS-CoV-2.  Clinical  correlation with patient history and other diagnostic information is  necessary to determine patient infection status.  Positive results do  not rule out bacterial infection or co-infection with other viruses. If result is PRESUMPTIVE POSTIVE SARS-CoV-2 nucleic acids MAY BE PRESENT.   A presumptive positive result was obtained on the submitted specimen  and confirmed on repeat testing.  While 2019 novel coronavirus  (SARS-CoV-2) nucleic acids may be present in the submitted  sample  additional  confirmatory testing may be necessary for epidemiological  and / or clinical management purposes  to differentiate between  SARS-CoV-2 and other Sarbecovirus currently known to infect humans.  If clinically indicated additional testing with an alternate test  methodology [phone]) is advised. The SARS-CoV-2 RNA is generally  detectable in upper and lower respiratory sp ecimens during the acute  phase of infection. The expected result is Negative. Fact Sheet for Patients:  StrictlyIdeas.no Fact Sheet for Healthcare Providers: BankingDealers.co.za This test is not yet approved or cleared by the [place] FDA and has been authorized for detection and/or diagnosis of SARS-CoV-2 by FDA under an Emergency Use Authorization (EUA).  This EUA will remain in effect (meaning this test can be used) for the duration of the COVID-19 declaration under Section 564(b)(1) of the Act, 21 U.S.C. section 360bbb-3(b)(1), unless the authorization is terminated or revoked sooner. Performed at [facility], [address], [place], [place] [address]    Studies/Results: Dg Chest 2 View  Result Date: 09/25/2018 CLINICAL DATA:  Near syncopal episode.  Chest pain. EXAM: CHEST - 2 VIEW COMPARISON:  12/18/2017. FINDINGS: The heart size is stable and mildly enlarged. There is some atelectasis versus scarring in the lung bases bilaterally. There is no focal infiltrate. IMPRESSION: No active cardiopulmonary disease. Electronically Signed   By: Constance Holster M.D.   On: 09/25/2018 13:37    Medications:  Prior to Admission:  Medications Prior to Admission  Medication Sig Dispense Refill Last Dose  . acetaminophen (TYLENOL) 500 MG tablet Take 500 mg by mouth daily as needed for mild pain, moderate pain or headache.      . albuterol (PROVENTIL HFA;VENTOLIN HFA) 108 (90 Base) MCG/ACT inhaler Inhale 1-2 puffs into the lungs every 6 (six) hours as needed for wheezing or  shortness of breath. 1 Inhaler 2   . amLODipine (NORVASC) 10 MG tablet TAKE ONE TABLET BY MOUTH ONCE DAILY 90 tablet 3 09/24/2018 at Unknown time  . aspirin 81 MG tablet Take 81 mg by mouth every morning.    09/24/2018 at 1800  . atorvastatin (LIPITOR) 80 MG tablet TAKE 1 TABLET (80 MG TOTAL) BY MOUTH DAILY AT 6 PM. 30 tablet 6 09/24/2018 at Unknown time  . losartan (COZAAR) 100 MG tablet Take 100 mg by mouth daily.   Past Month at Unknown time  . metoprolol tartrate (LOPRESSOR) 100 MG tablet Take 1 tablet (100 mg total) by mouth 2 (two) times daily. 60 tablet 3 09/25/2018 at 600  . metoprolol tartrate (LOPRESSOR) 50 MG tablet TAKE 1 TABLET BY MOUTH TWICE A DAY 180 tablet 3 09/25/2018 at 600  . nitroGLYCERIN (NITROSTAT) 0.4 MG SL tablet Place 1 tablet (0.4 mg total) under the tongue every 5 (five) minutes as needed for chest pain. 25 tablet 3   . pantoprazole (PROTONIX) 40 MG tablet Take 1 tablet (40 mg total) by mouth 2 (two) times daily. 60 tablet 3 09/25/2018 at Unknown time  . sildenafil (VIAGRA) 100 MG tablet Take 1 tablet (100 mg total) by mouth daily as needed for erectile dysfunction. 6 tablet 11   . tiotropium (SPIRIVA) 18 MCG inhalation capsule Place 1 capsule (18 mcg total) into inhaler and inhale daily. 30 capsule 3 Past Week at Unknown time   Scheduled: . amLODipine  10 mg Oral Daily  . aspirin  81 mg Oral q morning - 10a  . atorvastatin  80 mg Oral q1800  . enoxaparin (LOVENOX) injection  40 mg Subcutaneous Q24H  .  metoprolol tartrate  100 mg Oral BID  . metoprolol tartrate  50 mg Oral BID  . nicotine  21 mg Transdermal Daily  . pantoprazole  40 mg Oral BID  . sodium chloride flush  3 mL Intravenous Q12H  . thiamine  100 mg Oral Daily   Continuous: . sodium chloride    . sodium chloride     CVE:LFYBOF chloride, acetaminophen **OR** acetaminophen, albuterol, ALPRAZolam, nitroGLYCERIN, prochlorperazine, sodium chloride flush  Assesment: He was admitted with unstable angina.  He has  ruled out for acute MI.  He does have significant coronary disease and he is being set up for cardiac catheterization Principal Problem:   Unstable angina (HCC) Active Problems:   Tobacco abuse   CAD (coronary artery disease)   GERD (gastroesophageal reflux disease)   COPD (chronic obstructive pulmonary disease) (HCC)   Hypertension   Hyperlipidemia   Alcohol use   Palpitations    Plan: Transfer to [facility] for cardiac cath    LOS: 0 days   Timothy Walker 09/26/2018, 8:11 AM

## 2018-09-26 NOTE — H&P (View-Only) (Signed)
Progress Note  Patient Name: Timothy Walker Date of Encounter: 09/26/2018  Primary Cardiologist: Carlyle Dolly, MD   Subjective   Denies any recurrent chest pain overnight or this morning. Still has a "funny feeling" in his chest. No dyspnea on exertion, orthopnea, or PND. Anxious about his catheterization later today. Has been NPO since midnight.   Inpatient Medications    Scheduled Meds: . amLODipine  10 mg Oral Daily  . aspirin  81 mg Oral q morning - 10a  . atorvastatin  80 mg Oral q1800  . enoxaparin (LOVENOX) injection  40 mg Subcutaneous Q24H  . metoprolol tartrate  100 mg Oral BID  . metoprolol tartrate  50 mg Oral BID  . nicotine  21 mg Transdermal Daily  . pantoprazole  40 mg Oral BID  . sodium chloride flush  3 mL Intravenous Q12H  . thiamine  100 mg Oral Daily   Continuous Infusions: . sodium chloride    . sodium chloride     PRN Meds: sodium chloride, acetaminophen **OR** acetaminophen, albuterol, ALPRAZolam, nitroGLYCERIN, prochlorperazine, sodium chloride flush   Vital Signs    Vitals:   09/25/18 1955 09/25/18 2034 09/26/18 0518 09/26/18 0700  BP:  (!) 150/110 (!) 146/96   Pulse:  72 68   Resp:  17 17   Temp:  98 F (36.7 C) 98.2 F (36.8 C)   TempSrc:  Oral    SpO2: 94% 97% 93%   Weight:    83.9 kg  Height:        Intake/Output Summary (Last 24 hours) at 09/26/2018 0733 Last data filed at 09/26/2018 0650 Gross per 24 hour  Intake 0 ml  Output -  Net 0 ml    Last 3 Weights 09/26/2018 09/25/2018 09/25/2018  Weight (lbs) 184 lb 15.5 oz 181 lb 7 oz 185 lb  Weight (kg) 83.9 kg 82.3 kg 83.915 kg      Telemetry    NSR, HR in 60's to 70's with occasional PVC's.  - Personally Reviewed  ECG    NSR, HR 74, with nonspecific IVCD. No acute ST changes when compared to prior tracings.  - Personally Reviewed  Physical Exam   General: Well developed, well nourished, male appearing in no acute distress. Head: Normocephalic, atraumatic.  Neck: Supple  without bruits, JVD not elevated. Lungs:  Resp regular and unlabored, mild expiratory wheezing. Heart: RRR, S1, S2, no S3, S4, or murmur; no rub. Abdomen: Soft, non-tender, non-distended with normoactive bowel sounds. No hepatomegaly. No rebound/guarding. No obvious abdominal masses. Extremities: No clubbing, cyanosis, or lower extremity edema. Distal pedal pulses are 2+ bilaterally. Neuro: Alert and oriented X 3. Moves all extremities spontaneously. Psych: Normal affect.  Labs    Chemistry Recent Labs  Lab 09/25/18 1216  NA 135  K 3.5  CL 101  CO2 24  GLUCOSE 100*  BUN 9  CREATININE 0.78  CALCIUM 9.4  GFRNONAA >60  GFRAA >60  ANIONGAP 10     Hematology Recent Labs  Lab 09/25/18 1216  WBC 9.6  RBC 4.46  HGB 14.8  HCT 44.8  MCV 100.4*  MCH 33.2  MCHC 33.0  RDW 13.2  PLT 299    Cardiac EnzymesNo results for input(s): TROPONINI in the last 168 hours. No results for input(s): TROPIPOC in the last 168 hours.   BNPNo results for input(s): BNP, PROBNP in the last 168 hours.   DDimer No results for input(s): DDIMER in the last 168 hours.   Radiology  Dg Chest 2 View  Result Date: 09/25/2018 CLINICAL DATA:  Near syncopal episode.  Chest pain. EXAM: CHEST - 2 VIEW COMPARISON:  12/18/2017. FINDINGS: The heart size is stable and mildly enlarged. There is some atelectasis versus scarring in the lung bases bilaterally. There is no focal infiltrate. IMPRESSION: No active cardiopulmonary disease. Electronically Signed   By: Constance Holster M.D.   On: 09/25/2018 13:37    Cardiac Studies   Echocardiogram: 2014 Study Conclusions   - Left ventricle: There is hypokinesis of the basal inferior  nd inferolateral and mid inferior walls. The cavity size  was normal. Wall thickness was normal. Systolic function  was mildly reduced. The estimated ejection fraction was in  the range of 45% to 50%. Left ventricular diastolic  function parameters were normal.  -  Right ventricle: The cavity size was mildly dilated.  Systolic function was mildly reduced.  - Atrial septum: No defect or patent foramen ovale was  identified.   Catheterization: 12/2012 Coronary dominance: right    Left Main:   normal   Left Anterior Descending (LAD):   normal in size with minor irregularities throughout its course but no evidence of obstructive disease.   1st diagonal (D1):   small in size with 50% ostial stenosis.   2nd diagonal (D2):   small in size with minor irregularities.   3rd diagonal (D3):   very small in size.   Circumflex (LCx):   normal in size and nondominant. There is an 80-90% mid stenosis at the origin of OM 2.   1st obtuse marginal:   small in size with minor irregularities.   2nd obtuse marginal:   normal in size with no significant disease.   3rd obtuse marginal:   medium in size with no significant disease.     Ramus Intermedius:  normal in size with 20% proximal stenosis.   Right Coronary Artery:  large in size and dominant. There is 50% proximal stenosis. The vessel is occluded distally with large thrombus burden.  Posterior descending artery:  normal  Posterior AV segment:  normal   Posterolateral branchs:   normal  Left ventriculography: Left ventricular systolic function is  normal , LVEF is estimated at  55  %, there is  no  significant mitral regurgitation .  mild inferior wall hypokinesis   PCI Procedure Note:  Following the diagnostic procedure, the decision was made to proceed with PCI. Weight-based bivalirudin was given for anticoagulation. Brilinta 180 mg was crushed and given through the OG tube.  Once a therapeutic ACT was achieved, a 6 [place] JR 4  guide catheter was inserted.  A  intuition  coronary guidewire was used to cross the lesion.  aspiration thrombectomy was performed with a Pronto catheter with retrieval of a large thrombus.   The lesion was then stented with a  3.5 x 8 mm Xience expedition drug-eluting   stent.  The stent was postdilated with a  4.0 x 15 mm  noncompliant balloon.  Following PCI, there was 0% residual stenosis and TIMI-3 flow. Final angiography confirmed an excellent result. Femoral hemostasis was achieved with Perclose device .  throughout the case, the patient was agitated which made the procedure very difficult. He was given escalating doses of propofol and Versed. He was hypotensive on presentation and was started on dopamine and Levophed.  A transvenous pacemaker was removed at the end of the case. The intra-aortic balloon pump was secured in place. The patient left the cath lab in critical  but improved condition. I was able to stop Levophed.   PC  Data: Vessel - distal RCA/Segment -  3  Percent Stenosis (pre)   100% TIMI-flow 0  Stent  3.5 x 18 mm Xience expedition drug-eluting stent  Percent Stenosis (post)  0%  TIMI-flow (post)  3   Final Conclusions:   1. Refractory ventricular fibrillation and cardiogenic shock due to inferior ST elevation myocardial infarction. Occluded distal right coronary artery. There is significant bifurcation disease in the mid left circumflex supplying an overall small to medium sized territory.   2. Profound hypotension on presentation with subsequent improvement with revascularization and intra-aortic balloon pump placement. 3. Normal LV systolic function with moderately elevated left ventricular end-diastolic pressure. 4. Successful PCI and drug-eluting stent placement to the right coronary artery.   Patient Profile     53 y.o. male w/ PMH of CAD (s/p STEMI in 2014 with DES to RCA and complicated by VF), HTN, HLD, COPD, GERD, and OSA who had a telehealth visit with Dr. Harl Bowie on 09/25/2018 and ED evaluation was recommended after reports of worsening exertional fatigue and chest discomfort.   Assessment & Plan    1. Chest Discomfort and Fatigue concerning for Unstable Angina - presented with worsening chest discomfort and fatigue for the  past 3-4 days. Reports symptoms resemble his prior MI in 2014. - HS Troponin values have been negative. EKG shows no acute ischemic changes.  - catheterization recommended at the time of admission and the patient is in agreement for the procedure. Has been NPO since midnight. Started on IVF this AM. Engineer, maintenance (IT) and [facility] made aware of transfer.  - continue ASA, BB, and statin therapy.  2. CAD - s/p STEMI in 2014 with DES to RCA and complicated by VF. Plan for repeat catheterization later today. - continue ASA, statin, and BB therapy.   3. Ischemic Cardiomyopathy - EF at 45-50% by echocardiogram in 2014. Repeat echo is pending for assessment of LV function. CXR showed no active cardiopulmonary disease and he appears euvolemic by examination. - continue BB therapy. Will plan to restart Losartan following catheterization.   4. HLD - repeat FLP shows total cholesterol of 131, HDL 29, and LDL 57. At goal of LDL less than 70. Continue Atorvastatin 80mg  daily.   5. HTN - BP has been at 127/85 - 161/110 within the past 24 hours.  - on Amlodipine 10mg  daily, Losartan 100mg  daily, and Lopressor 150mg  BID PTA. Losartan held in anticipation of cath.   6. Tobacco Use - continues to smoke 1.0 - 1.5 ppd. Cessation advised. Nicotine patch in place.   For questions or updates, please contact [place] Please consult [email or web address] for contact info under Cardiology/STEMI.   Arna Medici , PA-C 7:33 AM 09/26/2018 Pager: [phone]   Attending note:  Patient seen and examined.  Case discussed with Ms. Ahmed Prima PA-C.  Mr. Loppnow does not report any recurrent chest pain under observation.  Still has an intermittent sense of palpitations however his cardiac monitor has not revealed any arrhythmias, single PVC noted.  On examination he appears comfortable.  Afebrile, heart rate in the 60s to 70s in sinus rhythm by telemetry.  Blood pressure 140s over 90s.  Lungs are clear without  labored breathing.  Cardiac exam reveals RRR without gallop.  Lab work shows normal high-sensitivity troponin I levels, LDL 57.  Anticipate transfer to [facility] today for diagnostic cardiac catheterization.  Risks and benefits discussed and he is in agreement  to proceed.  He is currently n.p.o.  Satira Sark, M.D., F.A.C.C.

## 2018-09-26 NOTE — Interval H&P Note (Signed)
Cath Lab Visit (complete for each Cath Lab visit)  Clinical Evaluation Leading to the Procedure:   ACS: Yes.    Non-ACS:    Anginal Classification: CCS III  Anti-ischemic medical therapy: Maximal Therapy (2 or more classes of medications)  Non-Invasive Test Results: No non-invasive testing performed  Prior CABG: No previous CABG      History and Physical Interval Note:  09/26/2018 1:39 PM  Timothy Walker  has presented today for surgery, with the diagnosis of unstable angina.  The various methods of treatment have been discussed with the patient and family. After consideration of risks, benefits and other options for treatment, the patient has consented to  Procedure(s): LEFT HEART CATH AND CORONARY ANGIOGRAPHY (N/A) as a surgical intervention.  The patient's history has been reviewed, patient examined, no change in status, stable for surgery.  I have reviewed the patient's chart and labs.  Questions were answered to the patient's satisfaction.     Quay Burow

## 2018-09-26 NOTE — Progress Notes (Signed)
Telephone report given to [facility] at this time.

## 2018-09-30 ENCOUNTER — Encounter (HOSPITAL_COMMUNITY): Payer: Self-pay | Admitting: Cardiovascular Disease

## 2018-10-01 ENCOUNTER — Encounter: Payer: Self-pay | Admitting: Cardiology

## 2018-10-01 ENCOUNTER — Ambulatory Visit: Payer: BC Managed Care – PPO | Admitting: Cardiology

## 2018-10-01 ENCOUNTER — Other Ambulatory Visit: Payer: Self-pay

## 2018-10-01 VITALS — BP 132/90 | HR 84 | Temp 97.5°F | Ht 71.0 in | Wt 185.0 lb

## 2018-10-01 DIAGNOSIS — R0789 Other chest pain: Secondary | ICD-10-CM

## 2018-10-01 DIAGNOSIS — I10 Essential (primary) hypertension: Secondary | ICD-10-CM

## 2018-10-01 DIAGNOSIS — R002 Palpitations: Secondary | ICD-10-CM

## 2018-10-01 DIAGNOSIS — E782 Mixed hyperlipidemia: Secondary | ICD-10-CM

## 2018-10-01 DIAGNOSIS — I2510 Atherosclerotic heart disease of native coronary artery without angina pectoris: Secondary | ICD-10-CM

## 2018-10-01 MED ORDER — VALSARTAN 160 MG PO TABS: 160.0000 mg | ORAL_TABLET | Freq: Every day | ORAL | 2 refills | Status: DC

## 2018-10-01 MED ORDER — NITROGLYCERIN 0.4 MG SL SUBL: 0.4000 mg | SUBLINGUAL_TABLET | SUBLINGUAL | 3 refills | Status: DC | PRN

## 2018-10-01 NOTE — Patient Instructions (Signed)
Medication Instructions:  STOP LOSARTAN   START VALSARTAN 160 MG DAILY   Labwork: NONE  Testing/Procedures:  PLEASE CALL OUR OFFICE WHEN YOU ARE READY TO WEAR THE 1 WEEK EVENT MONITOR   Follow-Up: Your physician recommends that you schedule a follow-up appointment in: 4 MONTHS    Any Other Special Instructions Will Be Listed Below (If Applicable).     If you need a refill on your cardiac medications before your next appointment, please call your pharmacy.

## 2018-10-01 NOTE — Progress Notes (Signed)
Clinical Summary Timothy Walker is a 53 y.o.male seen today for follow up of the following medical problems.   1. CAD  - admit to [facility] 01/13/13 to 01/17/13 with STEMI, DES to RCA.  - patient presented in cardiogenic shock, suffered VT/VF arrest requiring multiple defibrillations and IV amiodarone. Following PCI required balloon pump and vasopressors and temporary transvenous pacing.  - Echo LVEF 45-50%, inferior hypokinesis, normal diastolic function    - recent admission for chest pain 09/2018 cath mid LAD 30%, ramus 30%, LCX mid 90% small nondom and stable, RCA 30% prox.  09/2018 echo LVEF 06-30%, grade I diastolic dysfunction, basal mid inferior akinesis.   - can have feeling of palpitations, heavy pounding. Occurs daily. Can occur at any time. Lasts just a few seconds. No other significant chest symptoms - no coffee, sun drop 2L at time daily, daily iced tea, occasional EtoH about 3 times week.   2. HTN  - compliant with meds  3. Hyperlipidemia  - he is compliant with meds  4. COPD - abnormal PFTs in 2015 - mixed compliance with inhalers.      SH: heavy stress at home Past Medical History:  Diagnosis Date  . COPD (chronic obstructive pulmonary disease) ([place])   . Coronary artery disease    a. 12/2012 Inf STEMI/Cath/PCI: LM nl, LAD nl, D1 50ost, D2 min irregs, D3 small, LCX 80-2m, OM1/2/3 min irregs, RI 20p, RCA 50p/100d (3.5x18 Xience DEs),PDA/PL nl, EF 16% - complicated by VF/CGS/IABP/VDRF  . GERD (gastroesophageal reflux disease)   . Hiatal hernia   . Hypertension   . Ischemic cardiomyopathy    a. 12/2012 Echo: EF 45-50%, basal inf, inflat, mid inf HK, mildly reduced RV fxn.  . Marijuana abuse   . MI (myocardial infarction) ([place]) 2014  . Obstructive sleep apnea   . Tobacco abuse      No Known Allergies   Current Outpatient Medications  Medication Sig Dispense Refill  . acetaminophen (TYLENOL) 500 MG tablet Take 500 mg by mouth daily as  needed for mild pain, moderate pain or headache.     . albuterol (PROVENTIL HFA;VENTOLIN HFA) 108 (90 Base) MCG/ACT inhaler Inhale 1-2 puffs into the lungs every 6 (six) hours as needed for wheezing or shortness of breath. 1 Inhaler 2  . amLODipine (NORVASC) 10 MG tablet TAKE ONE TABLET BY MOUTH ONCE DAILY 90 tablet 3  . aspirin 81 MG tablet Take 81 mg by mouth every morning.     Marland Kitchen atorvastatin (LIPITOR) 80 MG tablet TAKE 1 TABLET (80 MG TOTAL) BY MOUTH DAILY AT 6 PM. 30 tablet 6  . losartan (COZAAR) 100 MG tablet Take 100 mg by mouth daily.    . metoprolol tartrate (LOPRESSOR) 100 MG tablet Take 1 tablet (100 mg total) by mouth 2 (two) times daily. 60 tablet 3  . metoprolol tartrate (LOPRESSOR) 50 MG tablet TAKE 1 TABLET BY MOUTH TWICE A DAY 180 tablet 3  . nicotine (NICODERM CQ - DOSED IN MG/24 HOURS) 21 mg/24hr patch Place 1 patch (21 mg total) onto the skin daily. 28 patch 0  . nitroGLYCERIN (NITROSTAT) 0.4 MG SL tablet Place 1 tablet (0.4 mg total) under the tongue every 5 (five) minutes as needed for chest pain. 25 tablet 3  . pantoprazole (PROTONIX) 40 MG tablet Take 1 tablet (40 mg total) by mouth 2 (two) times daily. 60 tablet 3  . sildenafil (VIAGRA) 100 MG tablet Take 1 tablet (100 mg total) by mouth daily  as needed for erectile dysfunction. 6 tablet 11  . tiotropium (SPIRIVA) 18 MCG inhalation capsule Place 1 capsule (18 mcg total) into inhaler and inhale daily. 30 capsule 3   No current facility-administered medications for this visit.      Past Surgical History:  Procedure Laterality Date  . COLONOSCOPY WITH PROPOFOL N/A 05/14/2014   Dr. Gala Romney: anal canal hemorrhoid, rectosigmoid hyerpplastic polyp, right-sided divetriculosis   . CORONARY ANGIOPLASTY  01/13/2013   STENT TO RCA BY DR COOPER  . DENTAL SURGERY    . ESOPHAGEAL DILATION N/A 05/14/2014   Procedure: ESOPHAGEAL DILATION [place];  Surgeon: Daneil Dolin, MD;  Location: [facility];  Service: Endoscopy;  Laterality:  N/A;  . ESOPHAGOGASTRODUODENOSCOPY (EGD) WITH PROPOFOL N/A 05/14/2014   Dr. Gala Romney: empiric dilation, abnormal esophagus s/p biopsy (normal), small hiatal hernia  . LEFT HEART CATH AND CORONARY ANGIOGRAPHY N/A 09/26/2018   Procedure: LEFT HEART CATH AND CORONARY ANGIOGRAPHY;  Surgeon: Lorretta Harp, MD;  Location: [place] [facility];  Service: Cardiovascular;  Laterality: N/A;  . LEFT HEART CATHETERIZATION WITH CORONARY ANGIOGRAM N/A 01/13/2013   Procedure: LEFT HEART CATHETERIZATION WITH CORONARY ANGIOGRAM;  Surgeon: Wellington Hampshire, MD;  Location: [place] CATH LAB;  Service: Cardiovascular;  Laterality: N/A;  . NASAL SEPTUM SURGERY    . PERCUTANEOUS CORONARY STENT INTERVENTION (PCI-S)  01/13/2013   Procedure: PERCUTANEOUS CORONARY STENT INTERVENTION (PCI-S);  Surgeon: Wellington Hampshire, MD;  Location: [facility] CATH LAB;  Service: Cardiovascular;;  . POLYPECTOMY  05/14/2014   Procedure: POLYPECTOMY;  Surgeon: Daneil Dolin, MD;  Location: [facility];  Service: Endoscopy;;     No Known Allergies    Family History  Problem Relation Age of Onset  . Heart attack Other   . Hypertension Other   . Colon cancer Neg Hx      Social History Timothy Walker reports that he has been smoking cigarettes. He started smoking about 36 years ago. He has a 46.50 pack-year smoking history. He has never used smokeless tobacco. Timothy Walker reports current alcohol use of about 2.0 standard drinks of alcohol per week.   Review of Systems CONSTITUTIONAL: No weight loss, fever, chills, weakness or fatigue.  HEENT: Eyes: No visual loss, blurred vision, double vision or yellow sclerae.No hearing loss, sneezing, congestion, runny nose or sore throat.  SKIN: No rash or itching.  CARDIOVASCULAR: per hpi RESPIRATORY: No shortness of breath, cough or sputum.  GASTROINTESTINAL: No anorexia, nausea, vomiting or diarrhea. No abdominal pain or blood.  GENITOURINARY: No burning on urination, no polyuria NEUROLOGICAL: No headache,  dizziness, syncope, paralysis, ataxia, numbness or tingling in the extremities. No change in bowel or bladder control.  MUSCULOSKELETAL: No muscle, back pain, joint pain or stiffness.  LYMPHATICS: No enlarged nodes. No history of splenectomy.  PSYCHIATRIC: No history of depression or anxiety.  ENDOCRINOLOGIC: No reports of sweating, cold or heat intolerance. No polyuria or polydipsia.  Marland Kitchen   Physical Examination Today's Vitals   10/01/18 1319  BP: 132/90  Pulse: 84  Temp: (!) 97.5 F (36.4 C)  SpO2: 93%  Weight: 185 lb (83.9 kg)  Height: 5\' 11"  (1.803 m)   Body mass index is 25.8 kg/m.  Gen: resting comfortably, no acute distress HEENT: no scleral icterus, pupils equal round and reactive, no palptable cervical adenopathy,  CV: RRR, no m/r/g no jvd Resp: Clear to auscultation bilaterally GI: abdomen is soft, non-tender, non-distended, normal bowel sounds, no hepatosplenomegaly MSK: extremities are warm, no edema.  Skin: warm,  no rash Neuro:  no focal deficits Psych: appropriate affect   Diagnostic Studies  09/2018 echo IMPRESSIONS    1. The left ventricle has mildly reduced systolic function, with an ejection fraction of 45-50%. The cavity size was mildly dilated. There is mildly increased left ventricular wall thickness. Left ventricular diastolic Doppler parameters are consistent  with impaired relaxation.  2. There is akinesis of the left ventricular, basal-mid inferior wall.  3. The right ventricle has normal systolic function. The cavity was normal. There is no increase in right ventricular wall thickness. Right ventricular systolic pressure is mildly elevated with an estimated pressure of 39.8 mmHg.  4. The mitral valve is grossly normal. There is mild mitral regurgitation.  5. The tricuspid valve is grossly normal.  6. The aortic valve is tricuspid.  7. The aortic root is normal in size and structure.  8. The inferior vena cava was dilated in size with >50%  respiratory variability.  09/2018 cath  Previously placed Mid RCA to Dist RCA stent (unknown type) is widely patent.  Mid Cx lesion is 90% stenosed.  Mid LAD lesion is 30% stenosed.  Prox RCA lesion is 30% stenosed.  Ramus lesion is 30% stenosed.  There is moderate left ventricular systolic dysfunction.  LV end diastolic pressure is mildly elevated.  The left ventricular ejection fraction is 35-45% by visual estimate.   KEIJI MELLAND is a 53 y.o. male IMPRESSION: Mr. Gell distal dominant RCA stent was widely patent.  He had high-grade lesion in a nondominant circumflex similar as described at the cath in 2014.  He had moderate LV dysfunction with an EF in the 40% range and severe inferior hypokinesia.  I do not see a culprit vessel new since his previous cath.  He does have moderate LV dysfunction and medical therapy will will be recommended for this.  The sheath was removed and a TR band was placed on the right wrist to achieve patent hemostasis.  The patient left the lab in stable condition.  He can be discharged home later today with TOC 7 followed by return office visit with Dr. Harl Bowie in 3 to 4 weeks.    Assessment and Plan  1. CAD/chest pain/palpitations - recent admission with chest pain. EKG and enzymes benign, cath with stable disease - no significant chest pain since discharge, ongoing palpitations. Plan for 1 week event monitor, he wishes to call us back when he is ready for the home monitor. Asked also to wean caffeine  2. HTN  -reasonable control, continue current meds  3. Hyperlipidemia  - continue statin     Arnoldo Lenis, M.D.

## 2018-10-04 ENCOUNTER — Encounter (HOSPITAL_COMMUNITY): Payer: Self-pay | Admitting: Cardiovascular Disease

## 2018-10-09 ENCOUNTER — Encounter: Payer: Self-pay | Admitting: Orthopaedic Surgery

## 2018-10-09 ENCOUNTER — Ambulatory Visit (INDEPENDENT_AMBULATORY_CARE_PROVIDER_SITE_OTHER): Payer: BC Managed Care – PPO

## 2018-10-09 ENCOUNTER — Ambulatory Visit
Admission: EM | Admit: 2018-10-09 | Discharge: 2018-10-09 | Disposition: A | Discharge status: Home / Self Care | Payer: BC Managed Care – PPO | Attending: Emergency Medicine | Admitting: Emergency Medicine

## 2018-10-09 ENCOUNTER — Ambulatory Visit (INDEPENDENT_AMBULATORY_CARE_PROVIDER_SITE_OTHER): Payer: BC Managed Care – PPO | Admitting: Orthopaedic Surgery

## 2018-10-09 ENCOUNTER — Other Ambulatory Visit: Payer: Self-pay

## 2018-10-09 ENCOUNTER — Ambulatory Visit: Payer: Self-pay

## 2018-10-09 VITALS — BP 146/97 | Ht 71.0 in | Wt 185.0 lb

## 2018-10-09 DIAGNOSIS — S62101A Fracture of unspecified carpal bone, right wrist, initial encounter for closed fracture: Secondary | ICD-10-CM

## 2018-10-09 DIAGNOSIS — S52601A Unspecified fracture of lower end of right ulna, initial encounter for closed fracture: Secondary | ICD-10-CM | POA: Diagnosis not present

## 2018-10-09 DIAGNOSIS — M25531 Pain in right wrist: Secondary | ICD-10-CM

## 2018-10-09 DIAGNOSIS — S62109A Fracture of unspecified carpal bone, unspecified wrist, initial encounter for closed fracture: Secondary | ICD-10-CM | POA: Insufficient documentation

## 2018-10-09 NOTE — ED Provider Notes (Signed)
[place]   [id number] 10/09/18 Arrival Time: 8144  CC: RT wrist pain  SUBJECTIVE: History from: patient. Timothy Walker is a 53 y.o. male hx significant for COPD, CAD, GERD, hiatal hernia, HTN, ischemic cardiomyopathy, marijuana abuse, OSA, and tobacco use, complains of right wrist pain pain that began 2 weeks ago.  States he hit the inside of his wrist up against the wall after "losing his cool."  Localizes the pain to the inside of wrist.  Describes the pain as intermittent and 7/10.  Has tried OTC medications as well as icing with relief.  Symptoms are made worse with wrist ROM and making a fist.  Complains of associated swelling, and bruising, now resolved.  Denies fever, chills, erythema, weakness, numbness and tingling, saddle paresthesias, loss of bowel or bladder function.      ROS: As per HPI.  Past Medical History:  Diagnosis Date  . COPD (chronic obstructive pulmonary disease) ([place])   . Coronary artery disease    a. 12/2012 Inf STEMI/Cath/PCI: LM nl, LAD nl, D1 50ost, D2 min irregs, D3 small, LCX 80-15m, OM1/2/3 min irregs, RI 20p, RCA 50p/100d (3.5x18 Xience DEs),PDA/PL nl, EF 81% - complicated by VF/CGS/IABP/VDRF  . GERD (gastroesophageal reflux disease)   . Hiatal hernia   . Hypertension   . Ischemic cardiomyopathy    a. 12/2012 Echo: EF 45-50%, basal inf, inflat, mid inf HK, mildly reduced RV fxn.  . Marijuana abuse   . MI (myocardial infarction) ([place]) 2014  . Obstructive sleep apnea   . Tobacco abuse    Past Surgical History:  Procedure Laterality Date  . COLONOSCOPY WITH PROPOFOL N/A 05/14/2014   Dr. Gala Romney: anal canal hemorrhoid, rectosigmoid hyerpplastic polyp, right-sided divetriculosis   . CORONARY ANGIOPLASTY  01/13/2013   STENT TO RCA BY DR COOPER  . DENTAL SURGERY    . ESOPHAGEAL DILATION N/A 05/14/2014   Procedure: ESOPHAGEAL DILATION [place];  Surgeon: Daneil Dolin, MD;  Location: [facility];  Service: Endoscopy;  Laterality: N/A;  .  ESOPHAGOGASTRODUODENOSCOPY (EGD) WITH PROPOFOL N/A 05/14/2014   Dr. Gala Romney: empiric dilation, abnormal esophagus s/p biopsy (normal), small hiatal hernia  . LEFT HEART CATH AND CORONARY ANGIOGRAPHY N/A 09/26/2018   Procedure: LEFT HEART CATH AND CORONARY ANGIOGRAPHY;  Surgeon: Lorretta Harp, MD;  Location: [place] CV LAB;  Service: Cardiovascular;  Laterality: N/A;  . LEFT HEART CATHETERIZATION WITH CORONARY ANGIOGRAM N/A 01/13/2013   Procedure: LEFT HEART CATHETERIZATION WITH CORONARY ANGIOGRAM;  Surgeon: Wellington Hampshire, MD;  Location: [place] CATH LAB;  Service: Cardiovascular;  Laterality: N/A;  . NASAL SEPTUM SURGERY    . PERCUTANEOUS CORONARY STENT INTERVENTION (PCI-S)  01/13/2013   Procedure: PERCUTANEOUS CORONARY STENT INTERVENTION (PCI-S);  Surgeon: Wellington Hampshire, MD;  Location: [facility] CATH LAB;  Service: Cardiovascular;;  . POLYPECTOMY  05/14/2014   Procedure: POLYPECTOMY;  Surgeon: Daneil Dolin, MD;  Location: [facility];  Service: Endoscopy;;   No Known Allergies No current facility-administered medications on file prior to encounter.    Current Outpatient Medications on File Prior to Encounter  Medication Sig Dispense Refill  . acetaminophen (TYLENOL) 500 MG tablet Take 500 mg by mouth daily as needed for mild pain, moderate pain or headache.     . albuterol (PROVENTIL HFA;VENTOLIN HFA) 108 (90 Base) MCG/ACT inhaler Inhale 1-2 puffs into the lungs every 6 (six) hours as needed for wheezing or shortness of breath. 1 Inhaler 2  . amLODipine (NORVASC) 10 MG tablet TAKE ONE TABLET BY  MOUTH ONCE DAILY 90 tablet 3  . aspirin 81 MG tablet Take 81 mg by mouth every morning.     Marland Kitchen atorvastatin (LIPITOR) 80 MG tablet TAKE 1 TABLET (80 MG TOTAL) BY MOUTH DAILY AT 6 PM. 30 tablet 6  . metoprolol tartrate (LOPRESSOR) 100 MG tablet Take 1 tablet (100 mg total) by mouth 2 (two) times daily. 60 tablet 3  . metoprolol tartrate (LOPRESSOR) 50 MG tablet TAKE 1 TABLET BY MOUTH TWICE A DAY 180 tablet 3   . nicotine (NICODERM CQ - DOSED IN MG/24 HOURS) 21 mg/24hr patch Place 1 patch (21 mg total) onto the skin daily. 28 patch 0  . nitroGLYCERIN (NITROSTAT) 0.4 MG SL tablet Place 1 tablet (0.4 mg total) under the tongue every 5 (five) minutes as needed for chest pain. 25 tablet 3  . pantoprazole (PROTONIX) 40 MG tablet Take 1 tablet (40 mg total) by mouth 2 (two) times daily. 60 tablet 3  . sildenafil (VIAGRA) 100 MG tablet Take 1 tablet (100 mg total) by mouth daily as needed for erectile dysfunction. 6 tablet 11  . tiotropium (SPIRIVA) 18 MCG inhalation capsule Place 1 capsule (18 mcg total) into inhaler and inhale daily. 30 capsule 3  . valsartan (DIOVAN) 160 MG tablet Take 1 tablet (160 mg total) by mouth daily. 90 tablet 2   Social History   Socioeconomic History  . Marital status: Divorced    Spouse name: Not on file  . Number of children: Not on file  . Years of education: Not on file  . Highest education level: Not on file  Occupational History  . Not on file  Social Needs  . Financial resource strain: Not on file  . Food insecurity    Worry: Not on file    Inability: Not on file  . Transportation needs    Medical: Not on file    Non-medical: Not on file  Tobacco Use  . Smoking status: Current Every Day Smoker    Packs/day: 1.50    Years: 31.00    Pack years: 46.50    Types: Cigarettes    Start date: 11/21/1981  . Smokeless tobacco: Never Used  Substance and Sexual Activity  . Alcohol use: Yes    Alcohol/week: 2.0 standard drinks    Types: 1 Cans of beer, 1 Shots of liquor per week    Comment: social  . Drug use: Yes    Types: Marijuana    Comment: last night  . Sexual activity: Yes  Lifestyle  . Physical activity    Days per week: Not on file    Minutes per session: Not on file  . Stress: Not on file  Relationships  . Social Herbalist on phone: Not on file    Gets together: Not on file    Attends religious service: Not on file    Active member of  club or organization: Not on file    Attends meetings of clubs or organizations: Not on file    Relationship status: Not on file  . Intimate partner violence    Fear of current or ex partner: Not on file    Emotionally abused: Not on file    Physically abused: Not on file    Forced sexual activity: Not on file  Other Topics Concern  . Not on file  Social History Narrative  . Not on file   Family History  Problem Relation Age of Onset  . Heart attack Other   .  Hypertension Other   . Colon cancer Neg Hx     OBJECTIVE:  Vitals:   10/09/18 1103  BP: (!) 146/97  Pulse: 78  Resp: 18  Temp: 98.1 F (36.7 C)  TempSrc: Oral  SpO2: 94%    General appearance: ALERT; in no acute distress.  Head: NCAT Lungs: Normal respiratory effort CV: Radial pulse 2+. Cap refill < 2 seconds Musculoskeletal: RT wrist Inspection: Obvious swelling over distal medial forearm Palpation: TTP over distal ulna ROM: LROM about the wrist Strength: decreased grip strength due to discomfort Skin: warm and dry Neurologic: Ambulates without difficulty; Sensation intact about the upper extremities Psychological: alert and cooperative; normal mood and affect  DIAGNOSTIC STUDIES:  Dg Wrist Complete Right  Result Date: 10/09/2018 CLINICAL DATA:  Right wrist pain and swelling after hitting a wall yesterday. EXAM: RIGHT WRIST - COMPLETE 3+ VIEW COMPARISON:  None. FINDINGS: Longitudinal fracture through the ulnar aspect of distal ulna dorsally, including the base of the ulnar styloid. No significant displacement or angulation. Associated soft tissue swelling. Mild 1st metacarpal/carpal degenerative changes. IMPRESSION: Nondisplaced distal ulna fracture. Electronically Signed   By: Claudie Revering M.D.   On: 10/09/2018 11:47     ASSESSMENT & PLAN:  1. Closed nondisplaced fracture of styloid process of right ulna, initial encounter     X-rays showed distal ulnar fracture Sugar tong splint placed Continue  conservative management of rest, ice, and elevation Continue with tylenol as needed for pain and inflammation Follow up with orthopedist for further evaluation and management Return or go to the ER if you have any new or worsening symptoms (fever, chills, chest pain, abdominal pain, changes in bowel or bladder habits, pain radiating into lower legs, etc...)   Reviewed expectations re: course of current medical issues. Questions answered. Outlined signs and symptoms indicating need for more acute intervention. Patient verbalized understanding. After Visit Summary given.    Lestine Box, PA-C 10/09/18 1159

## 2018-10-09 NOTE — ED Triage Notes (Signed)
Pt states he hit wall on 7/2 but pain has worsened, pain is right wrist and lateral forearm

## 2018-10-09 NOTE — Progress Notes (Signed)
Office Visit Note   Patient: Timothy Walker           Date of Birth: 26-Jan-1966           MRN: [id number] Visit Date: 10/09/2018              Requested by: Sinda Du, MD [address] [place],  [place] [address] PCP: Sinda Du, MD   Assessment & Plan: Visit Diagnoses:  1. Pain in right wrist   2. Closed fracture of right wrist, initial encounter     Plan: 2 weeks status post injury to right wrist when he struck a wall.  Seen at an urgent care this morning for persistent pain with x-rays demonstrating a fracture of the ulnar styloid.  He was placed in a splint and sent to the office for further evaluation.  There appears to be some slight displacement of the fracture.  Apply volar wrist splint note regarding limited activity at work and see again in 2 weeks.  This was not an on-the-job injury  Follow-Up Instructions: Return in about 2 weeks (around 10/23/2018).   Orders:  Orders Placed This Encounter  Procedures  . XR Forearm Right   No orders of the defined types were placed in this encounter.     Procedures: No procedures performed   Clinical Data: No additional findings.   Subjective: Chief Complaint  Patient presents with  . Right Wrist - Pain    DOI 09/26/2018  Patient presents today with right wrist pain. He hit a wall on 09/26/2018. He went to the [facility] in [place] today and had x-rays. He was told he had a ulna styloid fracture and placed into a splint. Prior to today he was treating his wrist with ice, salonpas, and epsom salt.   HPI  Review of Systems   Objective: Vital Signs: BP (!) 146/97   Ht 5\' 11"  (1.803 m)   Wt 185 lb (83.9 kg)   BMI 25.80 kg/m   Physical Exam Constitutional:      Appearance: He is well-developed.  Eyes:     Pupils: Pupils are equal, round, and reactive to light.  Pulmonary:     Effort: Pulmonary effort is normal.  Skin:    General: Skin is warm and dry.  Neurological:     Mental Status: He is  alert and oriented to person, place, and time.  Psychiatric:        Behavior: Behavior normal.     Ortho Exam right wrist was removed from the ulnar gutter splint.  There is tenderness of the distal ulna but not over the distal radius.  Mild swelling.  Neurologically intact some pain with range of motion of his wrist and with radial ulnar deviation related to the distal ulna.  Skin intact. Specialty Comments:  No specialty comments available.  Imaging: Dg Wrist Complete Right  Result Date: 10/09/2018 CLINICAL DATA:  Right wrist pain and swelling after hitting a wall yesterday. EXAM: RIGHT WRIST - COMPLETE 3+ VIEW COMPARISON:  None. FINDINGS: Longitudinal fracture through the ulnar aspect of distal ulna dorsally, including the base of the ulnar styloid. No significant displacement or angulation. Associated soft tissue swelling. Mild 1st metacarpal/carpal degenerative changes. IMPRESSION: Nondisplaced distal ulna fracture. Electronically Signed   By: Claudie Revering M.D.   On: 10/09/2018 11:47   Xr Forearm Right  Result Date: 10/09/2018 Films of the right forearm were obtained in several projections.  There is a fracture of the ulnar styloid.  There might be some displacement    PMFS History: Patient Active Problem List   Diagnosis Date Noted  . Wrist fracture, closed 10/09/2018  . Unstable angina ([place]) 09/25/2018  . COPD (chronic obstructive pulmonary disease) ([place])   . Hypertension   . Hyperlipidemia   . Alcohol use   . Palpitations   . Anal condyloma 01/21/2018  . Bloating 01/21/2018  . GERD (gastroesophageal reflux disease) 01/14/2018  . Esophageal dysphagia 01/14/2018  . Hemorrhoids 10/01/2015  . Mucosal abnormality of esophagus   . Hepatomegaly 04/24/2014  . Dysphagia, pharyngoesophageal phase 04/24/2014  . Hematochezia 04/24/2014  . Ventricular fibrillation ([place]) 01/17/2013  . CAD (coronary artery disease) 01/17/2013  . Hypotension 01/17/2013  . ST elevation myocardial  infarction (STEMI) of inferior wall, initial episode of care ([place]) 01/16/2013  . Ventricular tachycardia, sustained ([place]) 01/13/2013  . Acute and chronic respiratory failure 01/13/2013  . Tobacco abuse 01/13/2013  . Heart disease 01/13/2013  . Cardiogenic shock ([place]) 01/13/2013   Past Medical History:  Diagnosis Date  . COPD (chronic obstructive pulmonary disease) ([place])   . Coronary artery disease    a. 12/2012 Inf STEMI/Cath/PCI: LM nl, LAD nl, D1 50ost, D2 min irregs, D3 small, LCX 80-22m, OM1/2/3 min irregs, RI 20p, RCA 50p/100d (3.5x18 Xience DEs),PDA/PL nl, EF 03% - complicated by VF/CGS/IABP/VDRF  . GERD (gastroesophageal reflux disease)   . Hiatal hernia   . Hypertension   . Ischemic cardiomyopathy    a. 12/2012 Echo: EF 45-50%, basal inf, inflat, mid inf HK, mildly reduced RV fxn.  . Marijuana abuse   . MI (myocardial infarction) ([place]) 2014  . Obstructive sleep apnea   . Tobacco abuse     Family History  Problem Relation Age of Onset  . Heart attack Other   . Hypertension Other   . Colon cancer Neg Hx     Past Surgical History:  Procedure Laterality Date  . COLONOSCOPY WITH PROPOFOL N/A 05/14/2014   Dr. Gala Romney: anal canal hemorrhoid, rectosigmoid hyerpplastic polyp, right-sided divetriculosis   . CORONARY ANGIOPLASTY  01/13/2013   STENT TO RCA BY DR COOPER  . DENTAL SURGERY    . ESOPHAGEAL DILATION N/A 05/14/2014   Procedure: ESOPHAGEAL DILATION [place];  Surgeon: Daneil Dolin, MD;  Location: [facility];  Service: Endoscopy;  Laterality: N/A;  . ESOPHAGOGASTRODUODENOSCOPY (EGD) WITH PROPOFOL N/A 05/14/2014   Dr. Gala Romney: empiric dilation, abnormal esophagus s/p biopsy (normal), small hiatal hernia  . LEFT HEART CATH AND CORONARY ANGIOGRAPHY N/A 09/26/2018   Procedure: LEFT HEART CATH AND CORONARY ANGIOGRAPHY;  Surgeon: Lorretta Harp, MD;  Location: [facility];  Service: Cardiovascular;  Laterality: N/A;  . LEFT HEART CATHETERIZATION WITH CORONARY ANGIOGRAM  N/A 01/13/2013   Procedure: LEFT HEART CATHETERIZATION WITH CORONARY ANGIOGRAM;  Surgeon: Wellington Hampshire, MD;  Location: [facility];  Service: Cardiovascular;  Laterality: N/A;  . NASAL SEPTUM SURGERY    . PERCUTANEOUS CORONARY STENT INTERVENTION (PCI-S)  01/13/2013   Procedure: PERCUTANEOUS CORONARY STENT INTERVENTION (PCI-S);  Surgeon: Wellington Hampshire, MD;  Location: [facility];  Service: Cardiovascular;;  . POLYPECTOMY  05/14/2014   Procedure: POLYPECTOMY;  Surgeon: Daneil Dolin, MD;  Location: [facility];  Service: Endoscopy;;   Social History   Occupational History  . Not on file  Tobacco Use  . Smoking status: Current Every Day Smoker    Packs/day: 1.50    Years: 31.00    Pack years: 46.50    Types: Cigarettes  Start date: 11/21/1981  . Smokeless tobacco: Never Used  Substance and Sexual Activity  . Alcohol use: Yes    Alcohol/week: 2.0 standard drinks    Types: 1 Cans of beer, 1 Shots of liquor per week    Comment: social  . Drug use: Yes    Types: Marijuana    Comment: last night  . Sexual activity: Yes

## 2018-10-09 NOTE — Discharge Instructions (Signed)
X-rays showed distal ulnar styloid Splint placed Continue conservative management of rest, ice, and elevation Continue with tylenol as needed for pain and inflammation Follow up with orthopedist for further evaluation and management Return or go to the ER if you have any new or worsening symptoms (fever, chills, chest pain, abdominal pain, changes in bowel or bladder habits, pain radiating into lower legs, etc...)

## 2018-10-17 ENCOUNTER — Ambulatory Visit: Payer: BC Managed Care – PPO | Admitting: Student

## 2018-10-23 ENCOUNTER — Other Ambulatory Visit: Payer: Self-pay

## 2018-10-23 ENCOUNTER — Ambulatory Visit (INDEPENDENT_AMBULATORY_CARE_PROVIDER_SITE_OTHER): Payer: BC Managed Care – PPO | Admitting: Orthopaedic Surgery

## 2018-10-23 ENCOUNTER — Encounter: Payer: Self-pay | Admitting: Orthopaedic Surgery

## 2018-10-23 ENCOUNTER — Ambulatory Visit (INDEPENDENT_AMBULATORY_CARE_PROVIDER_SITE_OTHER): Payer: BC Managed Care – PPO

## 2018-10-23 VITALS — BP 132/92 | HR 81 | Ht 71.0 in | Wt 185.0 lb

## 2018-10-23 DIAGNOSIS — M25531 Pain in right wrist: Secondary | ICD-10-CM | POA: Diagnosis not present

## 2018-10-23 DIAGNOSIS — S62101D Fracture of unspecified carpal bone, right wrist, subsequent encounter for fracture with routine healing: Secondary | ICD-10-CM

## 2018-10-23 NOTE — Progress Notes (Signed)
Office Visit Note   Patient: Timothy Walker           Date of Birth: 1965-08-10           MRN: [id number] Visit Date: 10/23/2018              Requested by: Sinda Du, MD [address] [place],  [place] [address] PCP: Sinda Du, MD   Assessment & Plan: Visit Diagnoses:  1. Pain in right wrist   2. Closed fracture of right wrist with routine healing, subsequent encounter     Plan: 1 month status post right wrist injury with the above identified fracture in good position.  We will continue with the splint and light duty work and reevaluate in 2 weeks.  I do not think you will need further films  Follow-Up Instructions: Return in about 2 weeks (around 11/06/2018).   Orders:  Orders Placed This Encounter  Procedures  . XR Wrist 2 Views Right   No orders of the defined types were placed in this encounter.     Procedures: No procedures performed   Clinical Data: No additional findings.   Subjective: Chief Complaint  Patient presents with  . Right Wrist - Pain  Patient presents today for follow up on his right wrist. He fractured his wrist and has been wearing a volar splint for the past two weeks. He said that his wrist is still swollen and painful. He has been taking Ibuprofen if needed.  No new injuries or trauma.  On occasion he has some pain into the ulnar 2 digits  HPI  Review of Systems   Objective: Vital Signs: BP (!) 132/92   Pulse 81   Ht 5\' 11"  (1.803 m)   Wt 185 lb (83.9 kg)   BMI 25.80 kg/m   Physical Exam  Ortho Exam right wrist without obvious deformity.  Still having some tenderness along the distal ulna.  Minimal Tinel's over the ulnar nerve about the distal ulna.  No pain about the elbow.  Normal AB duction and ad duction of the fingers.  Normal sensation.  Normal capillary refill.   Specialty Comments:  No specialty comments available.  Imaging: Xr Wrist 2 Views Right  Result Date: 10/23/2018 Films of the right wrist were  obtained in several projections.  The previously identified fracture of the distal ulna is in good position.  There is a transverse component as well as a fracture of the ulnar styloid.  I believe this some early callus formation.  No fractures about the carpus or the radius.    PMFS History: Patient Active Problem List   Diagnosis Date Noted  . Wrist fracture, closed 10/09/2018  . Unstable angina ([place]) 09/25/2018  . COPD (chronic obstructive pulmonary disease) ([place])   . Hypertension   . Hyperlipidemia   . Alcohol use   . Palpitations   . Anal condyloma 01/21/2018  . Bloating 01/21/2018  . GERD (gastroesophageal reflux disease) 01/14/2018  . Esophageal dysphagia 01/14/2018  . Hemorrhoids 10/01/2015  . Mucosal abnormality of esophagus   . Hepatomegaly 04/24/2014  . Dysphagia, pharyngoesophageal phase 04/24/2014  . Hematochezia 04/24/2014  . Ventricular fibrillation ([place]) 01/17/2013  . CAD (coronary artery disease) 01/17/2013  . Hypotension 01/17/2013  . ST elevation myocardial infarction (STEMI) of inferior wall, initial episode of care ([place]) 01/16/2013  . Ventricular tachycardia, sustained ([place]) 01/13/2013  . Acute and chronic respiratory failure 01/13/2013  . Tobacco abuse 01/13/2013  . Heart disease 01/13/2013  . Cardiogenic shock (  [place]) 01/13/2013   Past Medical History:  Diagnosis Date  . COPD (chronic obstructive pulmonary disease) ([place])   . Coronary artery disease    a. 12/2012 Inf STEMI/Cath/PCI: LM nl, LAD nl, D1 50ost, D2 min irregs, D3 small, LCX 80-51m, OM1/2/3 min irregs, RI 20p, RCA 50p/100d (3.5x18 Xience DEs),PDA/PL nl, EF 50% - complicated by VF/CGS/IABP/VDRF  . GERD (gastroesophageal reflux disease)   . Hiatal hernia   . Hypertension   . Ischemic cardiomyopathy    a. 12/2012 Echo: EF 45-50%, basal inf, inflat, mid inf HK, mildly reduced RV fxn.  . Marijuana abuse   . MI (myocardial infarction) ([place]) 2014  . Obstructive sleep apnea   . Tobacco abuse      Family History  Problem Relation Age of Onset  . Heart attack Other   . Hypertension Other   . Colon cancer Neg Hx     Past Surgical History:  Procedure Laterality Date  . COLONOSCOPY WITH PROPOFOL N/A 05/14/2014   Dr. Gala Romney: anal canal hemorrhoid, rectosigmoid hyerpplastic polyp, right-sided divetriculosis   . CORONARY ANGIOPLASTY  01/13/2013   STENT TO RCA BY DR COOPER  . DENTAL SURGERY    . ESOPHAGEAL DILATION N/A 05/14/2014   Procedure: ESOPHAGEAL DILATION [place];  Surgeon: Daneil Dolin, MD;  Location: [facility];  Service: Endoscopy;  Laterality: N/A;  . ESOPHAGOGASTRODUODENOSCOPY (EGD) WITH PROPOFOL N/A 05/14/2014   Dr. Gala Romney: empiric dilation, abnormal esophagus s/p biopsy (normal), small hiatal hernia  . LEFT HEART CATH AND CORONARY ANGIOGRAPHY N/A 09/26/2018   Procedure: LEFT HEART CATH AND CORONARY ANGIOGRAPHY;  Surgeon: Lorretta Harp, MD;  Location: [facility];  Service: Cardiovascular;  Laterality: N/A;  . LEFT HEART CATHETERIZATION WITH CORONARY ANGIOGRAM N/A 01/13/2013   Procedure: LEFT HEART CATHETERIZATION WITH CORONARY ANGIOGRAM;  Surgeon: Wellington Hampshire, MD;  Location: [facility];  Service: Cardiovascular;  Laterality: N/A;  . NASAL SEPTUM SURGERY    . PERCUTANEOUS CORONARY STENT INTERVENTION (PCI-S)  01/13/2013   Procedure: PERCUTANEOUS CORONARY STENT INTERVENTION (PCI-S);  Surgeon: Wellington Hampshire, MD;  Location: [facility] CATH LAB;  Service: Cardiovascular;;  . POLYPECTOMY  05/14/2014   Procedure: POLYPECTOMY;  Surgeon: Daneil Dolin, MD;  Location: [facility];  Service: Endoscopy;;   Social History   Occupational History  . Not on file  Tobacco Use  . Smoking status: Current Every Day Smoker    Packs/day: 1.50    Years: 31.00    Pack years: 46.50    Types: Cigarettes    Start date: 11/21/1981  . Smokeless tobacco: Never Used  Substance and Sexual Activity  . Alcohol use: Yes    Alcohol/week: 2.0 standard drinks    Types: 1 Cans of beer, 1 Shots  of liquor per week    Comment: social  . Drug use: Yes    Types: Marijuana    Comment: last night  . Sexual activity: Yes

## 2018-11-04 ENCOUNTER — Telehealth: Payer: Self-pay | Admitting: Orthopaedic Surgery

## 2018-11-04 ENCOUNTER — Other Ambulatory Visit: Payer: Self-pay | Admitting: Cardiology

## 2018-11-04 NOTE — Telephone Encounter (Signed)
Tried to call patient. No answer. Voicemail is full. Patient is okay to go by the [facility] office and pick up note to return to full duty.

## 2018-11-04 NOTE — Telephone Encounter (Signed)
This patient was last evaluated on 10/23/18 for a right wrist fracture. He was working light duty and suppose to follow up in two weeks from last visit.  Please advise.

## 2018-11-04 NOTE — Telephone Encounter (Signed)
Ok to write? 

## 2018-11-04 NOTE — Telephone Encounter (Signed)
Patient left a voicemail stating his wrist has healed and requesting a letter stating he is able to return to work full duty.  Patient is requesting the letter be faxed to the [facility] office and he will pick it up there.  Please advise

## 2018-11-05 ENCOUNTER — Ambulatory Visit: Payer: BC Managed Care – PPO | Admitting: Cardiology

## 2018-11-05 NOTE — Telephone Encounter (Signed)
Called patient. No answer. Left message that he can go to the [place] location and pick up a work note releasing him to full duty.

## 2018-11-06 ENCOUNTER — Ambulatory Visit: Payer: BC Managed Care – PPO | Admitting: Orthopaedic Surgery

## 2018-11-06 ENCOUNTER — Other Ambulatory Visit: Payer: Self-pay

## 2018-11-06 ENCOUNTER — Encounter: Payer: Self-pay | Admitting: Orthopaedic Surgery

## 2018-11-06 ENCOUNTER — Telehealth: Payer: BLUE CROSS/BLUE SHIELD | Admitting: Cardiology

## 2018-11-08 ENCOUNTER — Other Ambulatory Visit: Payer: Self-pay | Admitting: Cardiology

## 2018-11-13 ENCOUNTER — Other Ambulatory Visit: Payer: Self-pay | Admitting: Cardiology

## 2018-12-05 ENCOUNTER — Other Ambulatory Visit: Payer: Self-pay | Admitting: Cardiology

## 2018-12-05 NOTE — Telephone Encounter (Signed)
°*  STAT* If patient is at the pharmacy, call can be transferred to refill team.   1. Which medications need to be refilled?  Losartan Potassium 100 mg tab 30.0 ea thirty Prescribed refills requested  6   2. Which pharmacy/location (including street and city if local pharmacy) is medication to be sent to? [facility]  3. Do they need a 30 day or 90 day supply?

## 2018-12-06 NOTE — Telephone Encounter (Signed)
Losartan was d/c'd in July 2020 pt is on Valsartan

## 2018-12-26 ENCOUNTER — Other Ambulatory Visit: Payer: Self-pay | Admitting: Cardiology

## 2018-12-27 ENCOUNTER — Telehealth: Payer: Self-pay | Admitting: Cardiology

## 2018-12-27 ENCOUNTER — Other Ambulatory Visit: Payer: Self-pay | Admitting: Cardiology

## 2018-12-27 NOTE — Telephone Encounter (Signed)
Stated that he can not get any of his meds per [facility]  Told him that they have sent numerous request for his losartin

## 2018-12-27 NOTE — Telephone Encounter (Signed)
Mailbox full - ned to confirm which medications he needs refilled

## 2018-12-31 ENCOUNTER — Other Ambulatory Visit: Payer: Self-pay | Admitting: Cardiology

## 2019-01-02 MED ORDER — LOSARTAN POTASSIUM 100 MG PO TABS: 100.0000 mg | ORAL_TABLET | Freq: Every day | ORAL | 1 refills | Status: DC

## 2019-01-02 MED ORDER — METOPROLOL TARTRATE 100 MG PO TABS: 100.0000 mg | ORAL_TABLET | Freq: Two times a day (BID) | ORAL | 1 refills | Status: DC

## 2019-01-02 NOTE — Telephone Encounter (Signed)
Update medication list - Medication sent to pharmacy

## 2019-01-02 NOTE — Telephone Encounter (Signed)
Pt requesting to change back to losartan 100 mg daily ([facility] was out and now they have it and its much cheaper for pt) will forward to covering provider if ok to change this back

## 2019-01-02 NOTE — Telephone Encounter (Signed)
That would be fine 

## 2019-02-04 ENCOUNTER — Other Ambulatory Visit: Payer: Self-pay

## 2019-02-04 DIAGNOSIS — Z20822 Contact with and (suspected) exposure to covid-19: Secondary | ICD-10-CM

## 2019-02-06 LAB — NOVEL CORONAVIRUS, NAA: SARS-CoV-2, NAA: NOT DETECTED

## 2019-02-07 ENCOUNTER — Ambulatory Visit: Payer: Self-pay

## 2019-02-07 NOTE — Telephone Encounter (Signed)
Provided covid-19testing result pt.  Voiced understanding.  Patient states that he is experiencing no taste and no smell recommended Patient tested  for covid -19 test.  Patient voiced understanding.

## 2019-02-11 ENCOUNTER — Other Ambulatory Visit: Payer: Self-pay

## 2019-02-11 DIAGNOSIS — Z20822 Contact with and (suspected) exposure to covid-19: Secondary | ICD-10-CM

## 2019-02-13 ENCOUNTER — Telehealth: Payer: Self-pay

## 2019-02-13 LAB — NOVEL CORONAVIRUS, NAA: SARS-CoV-2, NAA: NOT DETECTED

## 2019-02-13 NOTE — Telephone Encounter (Signed)
Pt. Given results of COVID 19 results. Verbalizes understanding.

## 2019-02-14 ENCOUNTER — Ambulatory Visit: Payer: BC Managed Care – PPO | Admitting: Cardiology

## 2019-03-28 ENCOUNTER — Other Ambulatory Visit: Payer: Self-pay | Admitting: Cardiology

## 2019-07-23 ENCOUNTER — Other Ambulatory Visit: Payer: Self-pay | Admitting: Cardiology

## 2019-07-24 ENCOUNTER — Other Ambulatory Visit: Payer: Self-pay | Admitting: Cardiology

## 2019-08-07 ENCOUNTER — Other Ambulatory Visit: Payer: Self-pay | Admitting: Cardiology

## 2019-08-29 ENCOUNTER — Other Ambulatory Visit: Payer: Self-pay | Admitting: Cardiology

## 2019-09-12 ENCOUNTER — Other Ambulatory Visit: Payer: Self-pay | Admitting: Cardiology

## 2019-09-24 ENCOUNTER — Telehealth: Payer: Self-pay | Admitting: Cardiology

## 2019-09-24 NOTE — Telephone Encounter (Signed)
     1. Which medications need to be refilled? (please list name of each medication and dose if known)  Spiriva Handihaler   2. Which pharmacy/location (including street and city if local pharmacy) is medication to be sent to? [facility]  3. Do they need a 30 day or 90 day supply?

## 2019-09-25 MED ORDER — SPIRIVA HANDIHALER 18 MCG IN CAPS: 18.0000 ug | ORAL_CAPSULE | Freq: Every day | RESPIRATORY_TRACT | 1 refills | Status: DC

## 2019-09-25 NOTE — Telephone Encounter (Signed)
Im ok refilling spiriva for him   Zandra Abts MD

## 2019-10-03 ENCOUNTER — Ambulatory Visit (INDEPENDENT_AMBULATORY_CARE_PROVIDER_SITE_OTHER): Payer: BC Managed Care – PPO | Admitting: Family Medicine

## 2019-10-03 ENCOUNTER — Other Ambulatory Visit: Payer: Self-pay

## 2019-10-03 ENCOUNTER — Encounter: Payer: Self-pay | Admitting: Family Medicine

## 2019-10-03 VITALS — BP 174/120 | HR 74 | Ht 71.0 in | Wt 187.0 lb

## 2019-10-03 DIAGNOSIS — E782 Mixed hyperlipidemia: Secondary | ICD-10-CM

## 2019-10-03 DIAGNOSIS — I10 Essential (primary) hypertension: Secondary | ICD-10-CM | POA: Diagnosis not present

## 2019-10-03 DIAGNOSIS — I2510 Atherosclerotic heart disease of native coronary artery without angina pectoris: Secondary | ICD-10-CM | POA: Diagnosis not present

## 2019-10-03 DIAGNOSIS — J449 Chronic obstructive pulmonary disease, unspecified: Secondary | ICD-10-CM

## 2019-10-03 DIAGNOSIS — G4733 Obstructive sleep apnea (adult) (pediatric): Secondary | ICD-10-CM | POA: Diagnosis not present

## 2019-10-03 DIAGNOSIS — N529 Male erectile dysfunction, unspecified: Secondary | ICD-10-CM

## 2019-10-03 MED ORDER — LOSARTAN POTASSIUM 100 MG PO TABS: 100.0000 mg | ORAL_TABLET | Freq: Every day | ORAL | 3 refills | Status: DC

## 2019-10-03 MED ORDER — SPIRIVA HANDIHALER 18 MCG IN CAPS: 18.0000 ug | ORAL_CAPSULE | Freq: Every day | RESPIRATORY_TRACT | 1 refills | Status: DC

## 2019-10-03 MED ORDER — AMLODIPINE BESYLATE 10 MG PO TABS: 10.0000 mg | ORAL_TABLET | Freq: Every day | ORAL | 3 refills | Status: DC

## 2019-10-03 NOTE — Patient Instructions (Addendum)
Medication Instructions:  Continue all current medications.  Labwork: none  Testing/Procedures: none  Follow-Up: 6 months   Any Other Special Instructions Will Be Listed Below (If Applicable). Establish with new primary care physician.   If you need a refill on your cardiac medications before your next appointment, please call your pharmacy.

## 2019-10-03 NOTE — Progress Notes (Signed)
Cardiology Office Note  Date: 10/03/2019   ID: Timothy Walker, DOB 09-Jul-1965, MRN [id number]  PCP:  Sinda Du, MD  Cardiologist:  Carlyle Dolly, MD Electrophysiologist:  None   Chief Complaint: Follow-up CAD, other chest pain, HLD, HTN, palpitations  History of Present Illness: Timothy Walker is a 54 y.o. male with a history of  CAD, other chest pain, HLD, HTN, palpitations.  History of STEMI 12/2012 with DES to RCA.  He presented in cardiogenic shock with VT/VF arrest multiple defibrillation and IV amiodarone, following PCI required balloon pump and pressors with temporary transvenous pacing.  Echo EF 45 to 50%, inferior hypokinesis, normal diastolic function.  Most recent admission for chest pain on 10/14/2018 cardiac catheterization showed mid LAD 30%, ramus 30%, LCx mid 90% small nondominant and stable, RCA 30% proximal.  Echo during that admission showed EF of 45 to 50%, grade 1 DD, basal and mid inferior akinesis.  Last saw Dr. Harl Bowie at 10/01/2018.  Stated he could have feelings of palpitations and heavy pounding occurring daily lasting only few seconds without other significant chest symptoms.  He was not consuming coffee but was drinking 2 L Sun Drops daily with ice tea and occasional alcohol approximately 3 times a week.  He was compliant with his antihypertensive medications and cholesterol medications.  History of abnormal PFTs in 2015 with diagnosis of COPD.  Partial compliance with inhalers.  He is here today for 29-month follow-up.  States he has run out of 2 of his medications for his blood pressure for the last few weeks including: Amlodipine and losartan.  His blood pressure is significantly elevated on arrival at 174/120.  Recheck in right arm after 15 minutes 174/102 states she has not been very compliant with medications.  Has not stopped smoking, has not stopped drinking, under a lot of stress.  Continues to complain of occasional strong heartbeats.  Continues to  drink a lot of caffeinated sodas and alcohol.  He denies any chest pain, pressure, tightness, neck, arm, jaw pain during exertion.  He denies any associated symptoms such as nausea, vomiting, or diaphoresis when exerting.  States he has been the typical male and not paying attention to his medication therapy.  Has not been wearing his CPAP like he should.  He apparently has not been to his primary care provider in quite some time but because he was unaware that Dr. Luan Pulling had retired.  He needs to establish with a new PCP.   Past Medical History:  Diagnosis Date  . COPD (chronic obstructive pulmonary disease) ([place])   . Coronary artery disease    a. 12/2012 Inf STEMI/Cath/PCI: LM nl, LAD nl, D1 50ost, D2 min irregs, D3 small, LCX 80-62m, OM1/2/3 min irregs, RI 20p, RCA 50p/100d (3.5x18 Xience DEs),PDA/PL nl, EF 65% - complicated by VF/CGS/IABP/VDRF  . GERD (gastroesophageal reflux disease)   . Hiatal hernia   . Hypertension   . Ischemic cardiomyopathy    a. 12/2012 Echo: EF 45-50%, basal inf, inflat, mid inf HK, mildly reduced RV fxn.  . Marijuana abuse   . MI (myocardial infarction) ([place]) 2014  . Obstructive sleep apnea   . Tobacco abuse     Past Surgical History:  Procedure Laterality Date  . COLONOSCOPY WITH PROPOFOL N/A 05/14/2014   Dr. Gala Romney: anal canal hemorrhoid, rectosigmoid hyerpplastic polyp, right-sided divetriculosis   . CORONARY ANGIOPLASTY  01/13/2013   STENT TO RCA BY DR COOPER  . DENTAL SURGERY    . ESOPHAGEAL DILATION N/A  05/14/2014   Procedure: ESOPHAGEAL DILATION [place];  Surgeon: Daneil Dolin, MD;  Location: [facility];  Service: Endoscopy;  Laterality: N/A;  . ESOPHAGOGASTRODUODENOSCOPY (EGD) WITH PROPOFOL N/A 05/14/2014   Dr. Gala Romney: empiric dilation, abnormal esophagus s/p biopsy (normal), small hiatal hernia  . LEFT HEART CATH AND CORONARY ANGIOGRAPHY N/A 09/26/2018   Procedure: LEFT HEART CATH AND CORONARY ANGIOGRAPHY;  Surgeon: Lorretta Harp, MD;   Location: [facility];  Service: Cardiovascular;  Laterality: N/A;  . LEFT HEART CATHETERIZATION WITH CORONARY ANGIOGRAM N/A 01/13/2013   Procedure: LEFT HEART CATHETERIZATION WITH CORONARY ANGIOGRAM;  Surgeon: Wellington Hampshire, MD;  Location: [facility];  Service: Cardiovascular;  Laterality: N/A;  . NASAL SEPTUM SURGERY    . PERCUTANEOUS CORONARY STENT INTERVENTION (PCI-S)  01/13/2013   Procedure: PERCUTANEOUS CORONARY STENT INTERVENTION (PCI-S);  Surgeon: Wellington Hampshire, MD;  Location: [facility] CATH [facility];  Service: Cardiovascular;;  . POLYPECTOMY  05/14/2014   Procedure: POLYPECTOMY;  Surgeon: Daneil Dolin, MD;  Location: [facility];  Service: Endoscopy;;    Current Outpatient Medications  Medication Sig Dispense Refill  . amLODipine (NORVASC) 10 MG tablet Take 1 tablet (10 mg total) by mouth daily. 90 tablet 3  . aspirin 81 MG tablet Take 81 mg by mouth every morning.     Marland Kitchen atorvastatin (LIPITOR) 80 MG tablet TAKE 1 TABLET (80 MG TOTAL) BY MOUTH DAILY AT 6 PM. 90 tablet 1  . losartan (COZAAR) 100 MG tablet Take 1 tablet (100 mg total) by mouth daily. 90 tablet 3  . metoprolol tartrate (LOPRESSOR) 100 MG tablet Take 1 tablet (100 mg total) by mouth 2 (two) times daily. 180 tablet 1  . metoprolol tartrate (LOPRESSOR) 50 MG tablet TAKE 1 TABLET BY MOUTH TWICE A DAY 180 tablet 3  . nitroGLYCERIN (NITROSTAT) 0.4 MG SL tablet Place 1 tablet (0.4 mg total) under the tongue every 5 (five) minutes as needed for chest pain. 25 tablet 3  . pantoprazole (PROTONIX) 40 MG tablet TAKE 1 TABLET BY MOUTH TWICE A DAY 180 tablet 1  . sildenafil (VIAGRA) 100 MG tablet Take 1 tablet (100 mg total) by mouth daily as needed for erectile dysfunction. 6 tablet 11  . tiotropium (SPIRIVA HANDIHALER) 18 MCG inhalation capsule Place 1 capsule (18 mcg total) into inhaler and inhale daily. 30 capsule 1  . albuterol (PROVENTIL HFA;VENTOLIN HFA) 108 (90 Base) MCG/ACT inhaler Inhale 1-2 puffs into the lungs every 6 (six)  hours as needed for wheezing or shortness of breath. (Patient not taking: Reported on 10/03/2019) 1 Inhaler 2   No current facility-administered medications for this visit.   Allergies:  Patient has no known allergies.   Social History: The patient  reports that he has been smoking cigarettes. He started smoking about 37 years ago. He has a 46.50 pack-year smoking history. He has never used smokeless tobacco. He reports current alcohol use of about 2.0 standard drinks of alcohol per week. He reports current drug use. Drug: Marijuana.   Family History: The patient's family history includes Heart attack in an other family member; Hypertension in an other family member.   ROS:  Please see the history of present illness. Otherwise, complete review of systems is positive for none.  All other systems are reviewed and negative.   Physical Exam: VS:  BP (!) 174/120   Pulse 74   Ht 5\' 11"  (1.803 m)   Wt 187 lb (84.8 kg)   SpO2 97%   BMI  26.08 kg/m , BMI Body mass index is 26.08 kg/m.  Wt Readings from Last 3 Encounters:  10/03/19 187 lb (84.8 kg)  10/23/18 185 lb (83.9 kg)  10/09/18 185 lb (83.9 kg)    General: Patient appears comfortable at rest. Neck: Supple, no elevated JVP or carotid bruits, no thyromegaly. Lungs: Clear to auscultation, nonlabored breathing at rest. Cardiac: Regular rate and rhythm, no S3 or significant systolic murmur, no pericardial rub. Extremities: No pitting edema, distal pulses 2+. Skin: Warm and dry. Musculoskeletal: No kyphosis. Neuropsychiatric: Alert and oriented x3, affect grossly appropriate.  ECG:  An ECG dated 10/03/2019 was personally reviewed today and demonstrated:  Normal sinus rhythm, possible left atrial enlargement, LVH with QRS widening nonspecific T wave abnormality, prolonged QT QT/QTc 420-466 ms.  Recent Labwork: No results found for requested labs within last 8760 hours.     Component Value Date/Time   CHOL 131 09/26/2018 0434   TRIG 227  (H) 09/26/2018 0434   HDL 29 (L) 09/26/2018 0434   CHOLHDL 4.5 09/26/2018 0434   VLDL 45 (H) 09/26/2018 0434   LDLCALC 57 09/26/2018 0434    Other Studies Reviewed Today:   Diagnostic Studies  09/2018 echo IMPRESSIONS   1. The left ventricle has mildly reduced systolic function, with an ejection fraction of 45-50%. The cavity size was mildly dilated. There is mildly increased left ventricular wall thickness. Left ventricular diastolic Doppler parameters are consistent  with impaired relaxation. 2. There is akinesis of the left ventricular, basal-mid inferior wall. 3. The right ventricle has normal systolic function. The cavity was normal. There is no increase in right ventricular wall thickness. Right ventricular systolic pressure is mildly elevated with an estimated pressure of 39.8 mmHg. 4. The mitral valve is grossly normal. There is mild mitral regurgitation. 5. The tricuspid valve is grossly normal. 6. The aortic valve is tricuspid. 7. The aortic root is normal in size and structure. 8. The inferior vena cava was dilated in size with >50% respiratory variability.  09/2018 cath  Previously placed Mid RCA to Dist RCA stent (unknown type) is widely patent.  Mid Cx lesion is 90% stenosed.  Mid LAD lesion is 30% stenosed.  Prox RCA lesion is 30% stenosed.  Ramus lesion is 30% stenosed.  There is moderate left ventricular systolic dysfunction.  LV end diastolic pressure is mildly elevated.  The left ventricular ejection fraction is 35-45% by visual estimate.  Timothy Walker a 54 y.o.male IMPRESSION:Timothy Walker's distal dominant RCA stent was widely patent. He had high-grade lesion in a nondominant circumflex similar as described at the cath in 2014. He had moderate LV dysfunction with an EF in the 40% range and severe inferior hypokinesia. I do not see a culprit vessel new since his previous cath. He does have moderate LV dysfunction and medical therapy  will will be recommended for this. The sheath was removed and a TR band was placed on the right wrist to achieve patent hemostasis. The patient left the lab in stable condition. He can be discharged home later today with TOC 7 followed by return office visit with Dr. Harl Bowie in 3 to 4 weeks.   Assessment and Plan:  1. CAD in native artery   2. Essential hypertension   3. Mixed hyperlipidemia   4. OSA (obstructive sleep apnea)    1. CAD in native artery Patient denies any recent anginal or exertional symptoms.  We went over his cardiac catheterization from 09/2018.  I explained that he has some significant  disease in his LCx with 90% stenosis, his mid and distal RCA stent was widely patent.  He has 30% disease and mid LAD, proximal RCA, ramus.  Continue aspirin 81 mg, sublingual nitroglycerin as needed.  Advise major lifestyle changes such as stopping smoking, cutting down on stimulant intake, taking antihypertensive medications as directed, continuing cholesterol medications, compliance with CPAP.  Patient verbalizes understanding  2. Essential hypertension Blood pressure initially 174/120 in right arm.  Recheck after patient sitting for at least 10 minutes was 174/102.  Patient had run out of both amlodipine and losartan over the last few weeks per his statement.  He needs refills on both.  Continue amlodipine 10 mg daily.  Continue losartan 100 mg daily.  Continue 150 mg metoprolol p.o. twice daily.  3. Mixed hyperlipidemia Most recent lipid panel 1 year ago showed a total cholesterol of 131, triglycerides 227, HDL 29, LDL 57.  Continue atorvastatin 80 mg daily.  4. OSA (obstructive sleep apnea) Patient has obstructive sleep apnea and is noncompliant with his CPAP.  Advised patient to try to be more compliant with his CPAP therapy as this contributes progression of other comorbidities.  5.  Smoking Patient admits that he continues to smoke.  Discussed cessation and patient was asking  questions regarding Chantix use.  Patient states he knows he needs to quit.  Advised he needs to establish with another PCP since Dr. Luan Pulling has retired.   6.  ED Patient asking for one-time refill on his sildenafil.  Please refill sildenafil 10 tablets.  He will need to get PCP to prescribe in the future.  7.  COPD Patient is asking for refill on his Spiriva inhaler.  He has not established with a PCP yet.  May refill Spiriva 1 time.  We will need to get further refills from PCP going forward.  Medication Adjustments/Labs and Tests Ordered: Current medicines are reviewed at length with the patient today.  Concerns regarding medicines are outlined above.   Disposition: Follow-up with Dr. Harl Bowie or APP 6 months  Signed, Levell July, NP 10/03/2019 4:32 PM    [facility] [address], [place], [place] [address] Phone: [phone]; Fax: [phone]

## 2019-10-07 ENCOUNTER — Inpatient Hospital Stay (HOSPITAL_COMMUNITY)
Admission: EM | Admit: 2019-10-07 | Discharge: 2019-10-09 | DRG: 247 | Disposition: A | Discharge status: Home / Self Care | Payer: BC Managed Care – PPO | Source: Other Acute Inpatient Hospital | Attending: Cardiology | Admitting: Cardiology

## 2019-10-07 ENCOUNTER — Emergency Department (HOSPITAL_COMMUNITY): Payer: BC Managed Care – PPO

## 2019-10-07 ENCOUNTER — Encounter (HOSPITAL_COMMUNITY)
Admission: EM | Disposition: A | Discharge status: Home / Self Care | Payer: Self-pay | Source: Other Acute Inpatient Hospital | Attending: Cardiology

## 2019-10-07 DIAGNOSIS — F1210 Cannabis abuse, uncomplicated: Secondary | ICD-10-CM | POA: Diagnosis present

## 2019-10-07 DIAGNOSIS — E876 Hypokalemia: Secondary | ICD-10-CM | POA: Diagnosis not present

## 2019-10-07 DIAGNOSIS — Z720 Tobacco use: Secondary | ICD-10-CM

## 2019-10-07 DIAGNOSIS — J449 Chronic obstructive pulmonary disease, unspecified: Secondary | ICD-10-CM | POA: Diagnosis present

## 2019-10-07 DIAGNOSIS — I10 Essential (primary) hypertension: Secondary | ICD-10-CM | POA: Diagnosis present

## 2019-10-07 DIAGNOSIS — I249 Acute ischemic heart disease, unspecified: Secondary | ICD-10-CM

## 2019-10-07 DIAGNOSIS — G4733 Obstructive sleep apnea (adult) (pediatric): Secondary | ICD-10-CM | POA: Diagnosis present

## 2019-10-07 DIAGNOSIS — K219 Gastro-esophageal reflux disease without esophagitis: Secondary | ICD-10-CM | POA: Diagnosis present

## 2019-10-07 DIAGNOSIS — I5022 Chronic systolic (congestive) heart failure: Secondary | ICD-10-CM | POA: Diagnosis present

## 2019-10-07 DIAGNOSIS — I214 Non-ST elevation (NSTEMI) myocardial infarction: Secondary | ICD-10-CM

## 2019-10-07 DIAGNOSIS — Z8616 Personal history of COVID-19: Secondary | ICD-10-CM

## 2019-10-07 DIAGNOSIS — Z7982 Long term (current) use of aspirin: Secondary | ICD-10-CM

## 2019-10-07 DIAGNOSIS — I25110 Atherosclerotic heart disease of native coronary artery with unstable angina pectoris: Secondary | ICD-10-CM | POA: Diagnosis present

## 2019-10-07 DIAGNOSIS — R079 Chest pain, unspecified: Secondary | ICD-10-CM | POA: Diagnosis not present

## 2019-10-07 DIAGNOSIS — E785 Hyperlipidemia, unspecified: Secondary | ICD-10-CM | POA: Diagnosis present

## 2019-10-07 DIAGNOSIS — F17210 Nicotine dependence, cigarettes, uncomplicated: Secondary | ICD-10-CM | POA: Diagnosis present

## 2019-10-07 DIAGNOSIS — I472 Ventricular tachycardia, unspecified: Secondary | ICD-10-CM

## 2019-10-07 DIAGNOSIS — Z8249 Family history of ischemic heart disease and other diseases of the circulatory system: Secondary | ICD-10-CM | POA: Diagnosis not present

## 2019-10-07 DIAGNOSIS — I255 Ischemic cardiomyopathy: Secondary | ICD-10-CM | POA: Diagnosis present

## 2019-10-07 DIAGNOSIS — I110 Hypertensive heart disease with heart failure: Secondary | ICD-10-CM | POA: Diagnosis present

## 2019-10-07 DIAGNOSIS — I340 Nonrheumatic mitral (valve) insufficiency: Secondary | ICD-10-CM | POA: Diagnosis not present

## 2019-10-07 DIAGNOSIS — I493 Ventricular premature depolarization: Secondary | ICD-10-CM | POA: Diagnosis present

## 2019-10-07 DIAGNOSIS — Z955 Presence of coronary angioplasty implant and graft: Secondary | ICD-10-CM

## 2019-10-07 DIAGNOSIS — Z79899 Other long term (current) drug therapy: Secondary | ICD-10-CM

## 2019-10-07 DIAGNOSIS — I252 Old myocardial infarction: Secondary | ICD-10-CM | POA: Diagnosis not present

## 2019-10-07 HISTORY — PX: LEFT HEART CATH AND CORONARY ANGIOGRAPHY: CATH118249

## 2019-10-07 HISTORY — DX: Non-ST elevation (NSTEMI) myocardial infarction: I21.4

## 2019-10-07 HISTORY — PX: CORONARY STENT INTERVENTION: CATH118234

## 2019-10-07 HISTORY — PX: CORONARY/GRAFT ACUTE MI REVASCULARIZATION: CATH118305

## 2019-10-07 LAB — BASIC METABOLIC PANEL
Anion gap: 12 (ref 5–15)
BUN: 10 mg/dL (ref 6–20)
CO2: 23 mmol/L (ref 22–32)
Calcium: 9 mg/dL (ref 8.9–10.3)
Chloride: 104 mmol/L (ref 98–111)
Creatinine, Ser: 0.95 mg/dL (ref 0.61–1.24)
GFR calc Af Amer: >60 mL/min (ref >60)
GFR calc non Af Amer: >60 mL/min (ref >60)
Glucose, Bld: 111 mg/dL — ABNORMAL HIGH (ref 70–99)
Potassium: 4.1 mmol/L (ref 3.5–5.1)
Sodium: 139 mmol/L (ref 135–145)

## 2019-10-07 LAB — CBC WITH DIFFERENTIAL/PLATELET
Abs Immature Granulocytes: 0.03 10*3/uL (ref 0.00–0.07)
Basophils Absolute: 0 10*3/uL (ref 0.0–0.1)
Basophils Relative: 0 %
Eosinophils Absolute: 0.1 10*3/uL (ref 0.0–0.5)
Eosinophils Relative: 1 %
HCT: 41.3 % (ref 39.0–52.0)
Hemoglobin: 14.1 g/dL (ref 13.0–17.0)
Immature Granulocytes: 0 %
Lymphocytes Relative: 27 %
Lymphs Abs: 3.1 10*3/uL (ref 0.7–4.0)
MCH: 34.6 pg — ABNORMAL HIGH (ref 26.0–34.0)
MCHC: 34.1 g/dL (ref 30.0–36.0)
MCV: 101.5 fL — ABNORMAL HIGH (ref 80.0–100.0)
Monocytes Absolute: 0.8 10*3/uL (ref 0.1–1.0)
Monocytes Relative: 6 %
Neutro Abs: 7.7 10*3/uL (ref 1.7–7.7)
Neutrophils Relative %: 66 %
Platelets: 228 10*3/uL (ref 150–400)
RBC: 4.07 MIL/uL — ABNORMAL LOW (ref 4.22–5.81)
RDW: 12.7 % (ref 11.5–15.5)
WBC: 11.7 10*3/uL — ABNORMAL HIGH (ref 4.0–10.5)
nRBC: 0 % (ref 0.0–0.2)

## 2019-10-07 LAB — HEPARIN LEVEL (UNFRACTIONATED): Heparin Unfractionated: 0.2 IU/mL — ABNORMAL LOW (ref 0.30–0.70)

## 2019-10-07 LAB — POCT ACTIVATED CLOTTING TIME
Activated Clotting Time: 164 seconds
Activated Clotting Time: 219 seconds
Activated Clotting Time: 257 seconds

## 2019-10-07 LAB — SARS CORONAVIRUS 2 BY RT PCR (HOSPITAL ORDER, PERFORMED IN ~~LOC~~ HOSPITAL LAB): SARS Coronavirus 2: NEGATIVE

## 2019-10-07 LAB — HIV ANTIBODY (ROUTINE TESTING W REFLEX): HIV Screen 4th Generation wRfx: NONREACTIVE

## 2019-10-07 LAB — TROPONIN I (HIGH SENSITIVITY)
Troponin I (High Sensitivity): 728 ng/L (ref <18)
Troponin I (High Sensitivity): 1924 ng/L (ref <18)

## 2019-10-07 LAB — MAGNESIUM: Magnesium: 1.6 mg/dL — ABNORMAL LOW (ref 1.7–2.4)

## 2019-10-07 SURGERY — LEFT HEART CATH AND CORONARY ANGIOGRAPHY
Anesthesia: LOCAL

## 2019-10-07 MED ORDER — MIDAZOLAM HCL 2 MG/2ML IJ SOLN
INTRAMUSCULAR | Status: AC
  Filled 2019-10-07: qty 2

## 2019-10-07 MED ORDER — AMLODIPINE BESYLATE 10 MG PO TABS
10.0000 mg | ORAL_TABLET | Freq: Every day | ORAL | Status: DC
  Administered 2019-10-07: 10 mg via ORAL
  Filled 2019-10-07: qty 1

## 2019-10-07 MED ORDER — TICAGRELOR 90 MG PO TABS
ORAL_TABLET | ORAL | Status: AC
  Filled 2019-10-07: qty 1

## 2019-10-07 MED ORDER — UMECLIDINIUM BROMIDE 62.5 MCG/INH IN AEPB
1.0000 | INHALATION_SPRAY | Freq: Every day | RESPIRATORY_TRACT | Status: DC
  Administered 2019-10-08 – 2019-10-09 (×2): 1 via RESPIRATORY_TRACT
  Filled 2019-10-07: qty 7

## 2019-10-07 MED ORDER — LOSARTAN POTASSIUM 50 MG PO TABS
100.0000 mg | ORAL_TABLET | Freq: Every day | ORAL | Status: DC
  Administered 2019-10-07: 100 mg via ORAL
  Filled 2019-10-07: qty 2

## 2019-10-07 MED ORDER — VERAPAMIL HCL 2.5 MG/ML IV SOLN: INTRAVENOUS | Status: DC | PRN

## 2019-10-07 MED ORDER — VERAPAMIL HCL 2.5 MG/ML IV SOLN
INTRAVENOUS | Status: AC
  Filled 2019-10-07: qty 2

## 2019-10-07 MED ORDER — SODIUM CHLORIDE 0.9 % WEIGHT BASED INFUSION
1.0000 mL/kg/h | INTRAVENOUS | Status: DC
  Administered 2019-10-07: 1 mL/kg/h via INTRAVENOUS

## 2019-10-07 MED ORDER — HEPARIN (PORCINE) IN NACL 1000-0.9 UT/500ML-% IV SOLN
INTRAVENOUS | Status: AC
  Filled 2019-10-07: qty 500

## 2019-10-07 MED ORDER — ACETAMINOPHEN 325 MG PO TABS: 650.0000 mg | ORAL_TABLET | ORAL | Status: DC | PRN

## 2019-10-07 MED ORDER — HEPARIN SODIUM (PORCINE) 1000 UNIT/ML IJ SOLN
INTRAMUSCULAR | Status: AC
  Filled 2019-10-07: qty 1

## 2019-10-07 MED ORDER — IOHEXOL 350 MG/ML SOLN
INTRAVENOUS | Status: DC | PRN
  Administered 2019-10-07: 130 mL via INTRA_ARTERIAL

## 2019-10-07 MED ORDER — MORPHINE SULFATE (PF) 4 MG/ML IV SOLN
4.0000 mg | Freq: Once | INTRAVENOUS | Status: AC
  Administered 2019-10-07: 4 mg via INTRAVENOUS
  Filled 2019-10-07: qty 1

## 2019-10-07 MED ORDER — PANTOPRAZOLE SODIUM 40 MG PO TBEC
40.0000 mg | DELAYED_RELEASE_TABLET | Freq: Two times a day (BID) | ORAL | Status: DC
  Administered 2019-10-07 – 2019-10-09 (×4): 40 mg via ORAL
  Filled 2019-10-07 (×4): qty 1

## 2019-10-07 MED ORDER — HEPARIN (PORCINE) 25000 UT/250ML-% IV SOLN
1100.0000 [IU]/h | INTRAVENOUS | Status: DC
  Administered 2019-10-07: 1100 [IU]/h via INTRAVENOUS
  Filled 2019-10-07: qty 250

## 2019-10-07 MED ORDER — ASPIRIN EC 81 MG PO TBEC
81.0000 mg | DELAYED_RELEASE_TABLET | Freq: Every day | ORAL | Status: DC
  Administered 2019-10-08 – 2019-10-09 (×2): 81 mg via ORAL
  Filled 2019-10-07 (×3): qty 1

## 2019-10-07 MED ORDER — ASPIRIN 81 MG PO CHEW
81.0000 mg | CHEWABLE_TABLET | ORAL | Status: AC
  Administered 2019-10-07: 81 mg via ORAL
  Filled 2019-10-07: qty 1

## 2019-10-07 MED ORDER — FUROSEMIDE 10 MG/ML IJ SOLN
40.0000 mg | Freq: Once | INTRAMUSCULAR | Status: AC
  Administered 2019-10-07: 40 mg via INTRAVENOUS

## 2019-10-07 MED ORDER — SODIUM CHLORIDE 0.9% FLUSH: 3.0000 mL | Freq: Two times a day (BID) | INTRAVENOUS | Status: DC

## 2019-10-07 MED ORDER — NITROGLYCERIN 0.4 MG SL SUBL: 0.4000 mg | SUBLINGUAL_TABLET | SUBLINGUAL | Status: DC | PRN

## 2019-10-07 MED ORDER — FENTANYL CITRATE (PF) 100 MCG/2ML IJ SOLN
INTRAMUSCULAR | Status: AC
  Filled 2019-10-07: qty 2

## 2019-10-07 MED ORDER — SODIUM CHLORIDE 0.9% FLUSH
3.0000 mL | Freq: Two times a day (BID) | INTRAVENOUS | Status: DC
  Administered 2019-10-08 – 2019-10-09 (×3): 3 mL via INTRAVENOUS

## 2019-10-07 MED ORDER — LABETALOL HCL 5 MG/ML IV SOLN: 10.0000 mg | INTRAVENOUS | Status: AC | PRN

## 2019-10-07 MED ORDER — FENTANYL CITRATE (PF) 100 MCG/2ML IJ SOLN
INTRAMUSCULAR | Status: DC | PRN
  Administered 2019-10-07 (×3): 50 ug via INTRAVENOUS

## 2019-10-07 MED ORDER — TICAGRELOR 90 MG PO TABS
90.0000 mg | ORAL_TABLET | Freq: Two times a day (BID) | ORAL | Status: DC
  Administered 2019-10-07 – 2019-10-09 (×4): 90 mg via ORAL
  Filled 2019-10-07 (×4): qty 1

## 2019-10-07 MED ORDER — NICOTINE 14 MG/24HR TD PT24
14.0000 mg | MEDICATED_PATCH | Freq: Every day | TRANSDERMAL | Status: DC
  Administered 2019-10-07 – 2019-10-09 (×3): 14 mg via TRANSDERMAL
  Filled 2019-10-07 (×3): qty 1

## 2019-10-07 MED ORDER — HEPARIN SODIUM (PORCINE) 1000 UNIT/ML IJ SOLN
INTRAMUSCULAR | Status: DC | PRN
  Administered 2019-10-07: 3000 [IU] via INTRAVENOUS
  Administered 2019-10-07: 4500 [IU] via INTRAVENOUS
  Administered 2019-10-07: 2000 [IU] via INTRAVENOUS
  Administered 2019-10-07: 5000 [IU] via INTRAVENOUS

## 2019-10-07 MED ORDER — LIDOCAINE HCL (PF) 1 % IJ SOLN
INTRAMUSCULAR | Status: DC | PRN
  Administered 2019-10-07: 2 mL

## 2019-10-07 MED ORDER — PANTOPRAZOLE SODIUM 40 MG PO TBEC: 40.0000 mg | DELAYED_RELEASE_TABLET | Freq: Two times a day (BID) | ORAL | Status: DC

## 2019-10-07 MED ORDER — ONDANSETRON HCL 4 MG/2ML IJ SOLN: 4.0000 mg | Freq: Four times a day (QID) | INTRAMUSCULAR | Status: DC | PRN

## 2019-10-07 MED ORDER — HYDRALAZINE HCL 20 MG/ML IJ SOLN: 10.0000 mg | INTRAMUSCULAR | Status: AC | PRN

## 2019-10-07 MED ORDER — TIOTROPIUM BROMIDE MONOHYDRATE 18 MCG IN CAPS: 18.0000 ug | ORAL_CAPSULE | Freq: Every day | RESPIRATORY_TRACT | Status: DC

## 2019-10-07 MED ORDER — ASPIRIN 81 MG PO TABS: 81.0000 mg | ORAL_TABLET | Freq: Every morning | ORAL | Status: DC

## 2019-10-07 MED ORDER — HEPARIN BOLUS VIA INFUSION
4000.0000 [IU] | Freq: Once | INTRAVENOUS | Status: AC
  Administered 2019-10-07: 4000 [IU] via INTRAVENOUS
  Filled 2019-10-07: qty 4000

## 2019-10-07 MED ORDER — AMLODIPINE BESYLATE 10 MG PO TABS: 10.0000 mg | ORAL_TABLET | Freq: Every day | ORAL | Status: DC

## 2019-10-07 MED ORDER — HEPARIN (PORCINE) IN NACL 1000-0.9 UT/500ML-% IV SOLN
INTRAVENOUS | Status: DC | PRN
  Administered 2019-10-07 (×2): 500 mL

## 2019-10-07 MED ORDER — FUROSEMIDE 10 MG/ML IJ SOLN
INTRAMUSCULAR | Status: AC
  Filled 2019-10-07: qty 4

## 2019-10-07 MED ORDER — SODIUM CHLORIDE 0.9 % IV SOLN: INTRAVENOUS | Status: AC

## 2019-10-07 MED ORDER — MIDAZOLAM HCL 2 MG/2ML IJ SOLN
INTRAMUSCULAR | Status: DC | PRN
  Administered 2019-10-07 (×2): 1 mg via INTRAVENOUS
  Administered 2019-10-07: 2 mg via INTRAVENOUS

## 2019-10-07 MED ORDER — TICAGRELOR 90 MG PO TABS
ORAL_TABLET | ORAL | Status: DC | PRN
  Administered 2019-10-07: 180 mg via ORAL

## 2019-10-07 MED ORDER — LIDOCAINE HCL (PF) 1 % IJ SOLN
INTRAMUSCULAR | Status: AC
  Filled 2019-10-07: qty 30

## 2019-10-07 MED ORDER — METOPROLOL TARTRATE 100 MG PO TABS
100.0000 mg | ORAL_TABLET | Freq: Two times a day (BID) | ORAL | Status: DC
  Administered 2019-10-07: 100 mg via ORAL
  Filled 2019-10-07: qty 1

## 2019-10-07 MED ORDER — SODIUM CHLORIDE 0.9 % IV SOLN: 250.0000 mL | INTRAVENOUS | Status: DC | PRN

## 2019-10-07 MED ORDER — SODIUM CHLORIDE 0.9% FLUSH: 3.0000 mL | INTRAVENOUS | Status: DC | PRN

## 2019-10-07 MED ORDER — SODIUM CHLORIDE 0.9 % WEIGHT BASED INFUSION: 3.0000 mL/kg/h | INTRAVENOUS | Status: DC

## 2019-10-07 MED ORDER — NITROGLYCERIN IN D5W 200-5 MCG/ML-% IV SOLN
0.0000 ug/min | INTRAVENOUS | Status: DC
  Administered 2019-10-07: 5 ug/min via INTRAVENOUS
  Filled 2019-10-07: qty 250

## 2019-10-07 MED ORDER — QUETIAPINE FUMARATE 25 MG PO TABS
25.0000 mg | ORAL_TABLET | Freq: Every day | ORAL | Status: DC
  Administered 2019-10-07 – 2019-10-08 (×2): 25 mg via ORAL
  Filled 2019-10-07 (×2): qty 1

## 2019-10-07 MED ORDER — ATORVASTATIN CALCIUM 80 MG PO TABS
80.0000 mg | ORAL_TABLET | Freq: Every day | ORAL | Status: DC
  Administered 2019-10-07 – 2019-10-08 (×2): 80 mg via ORAL
  Filled 2019-10-07 (×2): qty 1

## 2019-10-07 MED ORDER — LOSARTAN POTASSIUM 50 MG PO TABS: 100.0000 mg | ORAL_TABLET | Freq: Every day | ORAL | Status: DC

## 2019-10-07 MED ORDER — METOPROLOL TARTRATE 100 MG PO TABS: 100.0000 mg | ORAL_TABLET | Freq: Two times a day (BID) | ORAL | Status: DC

## 2019-10-07 SURGICAL SUPPLY — 21 items
BALLN SAPPHIRE 2.5X12 (BALLOONS) ×2
BALLN SAPPHIRE ~~LOC~~ 3.0X12 (BALLOONS) ×1 IMPLANT
BALLN SAPPHIRE ~~LOC~~ 3.0X8 (BALLOONS) ×1 IMPLANT
BALLOON SAPPHIRE 2.5X12 (BALLOONS) IMPLANT
CATH INFINITI 5FR ANG PIGTAIL (CATHETERS) ×1 IMPLANT
CATH OPTITORQUE TIG 4.0 5F (CATHETERS) ×1 IMPLANT
CATH VISTA GUIDE 6FR XBLAD3.5 (CATHETERS) ×1 IMPLANT
DEVICE RAD COMP TR BAND LRG (VASCULAR PRODUCTS) ×1 IMPLANT
GLIDESHEATH SLEND SS 6F .021 (SHEATH) ×1 IMPLANT
GUIDEWIRE INQWIRE 1.5J.035X260 (WIRE) IMPLANT
INQWIRE 1.5J .035X260CM (WIRE) ×2
KIT ENCORE 26 ADVANTAGE (KITS) ×2 IMPLANT
KIT HEART LEFT (KITS) ×2 IMPLANT
PACK CARDIAC CATHETERIZATION (CUSTOM PROCEDURE TRAY) ×2 IMPLANT
SHEATH PROBE COVER 6X72 (BAG) ×1 IMPLANT
STENT SYNERGY XD 2.50X20 (Permanent Stent) IMPLANT
SYNERGY XD 2.50X20 (Permanent Stent) ×2 IMPLANT
TRANSDUCER W/STOPCOCK (MISCELLANEOUS) ×2 IMPLANT
TUBING CIL FLEX 10 FLL-RA (TUBING) ×2 IMPLANT
WIRE ASAHI PROWATER 180CM (WIRE) ×1 IMPLANT
WIRE RUNTHROUGH .014X180CM (WIRE) ×1 IMPLANT

## 2019-10-07 NOTE — Progress Notes (Signed)
Patient has complained of shortness of breath typical of response to Brilinta loading dose. O2 sats remain in mic 90's. Lungs clear. Voiding well after lasox given in cath holding area. Notified Angie Duke PA of patients complaints of shortness of breath and anxiety related to this. Above assessment reviewed with PA. Notified that patient is having frequent multifocal PVC's and had a run of VT. Symptomatic with ectopy. When reviewing monitor alarms noted tachycardia rate of 155. Angie Duke will reviewed above and assessed patient. Orders received. Oliver Pila RN aware.

## 2019-10-07 NOTE — ED Notes (Signed)
Pt now in 8/10 cp, pain in L arm worse

## 2019-10-07 NOTE — Interval H&P Note (Signed)
History and Physical Interval Note:  10/07/2019 10:13 AM   Cardiology Progress Note:   Pt seen in the ED this am by our cardiology fellow. Admission orders placed.  Chest pain began at 11 pm. Similar to prior cardiac pain.  Pain has persisted through the night.  EKG with sinus, inferior and anterolateral ST depression, new since his EKG 4 days ago HsTroponin 728 Ongoing pain this am.  Plan for cardiac cath second case today.  Continue IV NTG and IV heparin. Morphine now for pain.     I have reviewed the risks, indications, and alternatives to cardiac catheterization, possible angioplasty, and stenting with the patient. Risks include but are not limited to bleeding, infection, vascular injury, stroke, myocardial infection, arrhythmia, kidney injury, radiation-related injury in the case of prolonged fluoroscopy use, emergency cardiac surgery, and death. The patient understands the risks of serious complication is 1-2 in 9179 with diagnostic cardiac cath and 1-2% or less with angioplasty/stenting.   Lauree Chandler 10/07/2019 8:23 AM   Timothy Walker  has presented today for surgery, with the diagnosis of NSTEMI  The various methods of treatment have been discussed with the patient and family. After consideration of risks, benefits and other options for treatment, the patient has consented to  Procedure(s): LEFT HEART CATH AND CORONARY ANGIOGRAPHY (N/A)  PERCUTANEOUS CORONARY INTERVENTION  as a surgical intervention.  The patient's history has been reviewed, patient examined, no change in status, stable for surgery.  I have reviewed the patient's chart and labs.  Questions were answered to the patient's satisfaction.    Cath Lab Visit (complete for each Cath Lab visit)  Clinical Evaluation Leading to the Procedure:   ACS: Yes.    Non-ACS:    Anginal Classification: CCS IV  Anti-ischemic medical therapy: Minimal Therapy (1 class of medications)  Non-Invasive Test Results: No  non-invasive testing performed  Prior CABG: No previous CABG  Glenetta Hew

## 2019-10-07 NOTE — Progress Notes (Addendum)
Cath lab staff notified us that patient felt somewhat SOB in the holding area. He describes this as a feeling that his breath is taken away. This may be Brilinta related but also could be due to some mild heart failure given his LVEF 45%, infarct and LVEDP 24mmHg. Patient appears in NAD, no wheezing/rhonchi, lungs clear, VSS. EKG remains abnormal but relatively stable if not slightly improved from prior - reviewed with Dr. Angelena Form who suggests giving Lasix 40mg  x1 IV. Reviewed post cath fluid plan with Dr. Ellyn Hack who confirms he prefers to keep this at [facility] as ordered. Have also included echo in admit orders.  Jaxiel Kines PA-C

## 2019-10-07 NOTE — Care Management (Signed)
1521 10-07-19 Case Manager received consult for Brilinta cost. Benefits check submitted and Case Manager will follow for additional transition of care needs. Graves-Bigelow, Ocie Cornfield, RN, BSN Case Manager

## 2019-10-07 NOTE — TOC Benefit Eligibility Note (Signed)
Transition of Care [facility]) Benefit Eligibility Note    Patient Details  Name: Timothy Walker MRN: [id number] Date of Birth: 11-28-1965   Medication/Dose: Kary Kos 90mg  BID  Covered?: Yes     Prescription Coverage Preferred Pharmacy: any local pharmacy  Spoke with Person/Company/Phone Number:: [facility] [employer]> [employer] [phone]  Co-Pay: $35 for 30 day retail/ $105 for 90 day mail order  Prior Approval: No          Timothy Walker Phone Number: 10/07/2019, 4:19 PM

## 2019-10-07 NOTE — ED Provider Notes (Signed)
[facility] EMERGENCY DEPARTMENT Provider Note   CSN: [id number] Arrival date & time: 10/07/19  9169   History Chief Complaint  Patient presents with  . Chest Pain    Timothy Walker is a 54 y.o. male.  The history is provided by the patient.  Chest Pain He has history of COPD, hypertension, coronary artery disease status post stent placement and is transferred here from [facility] emergency department because of chest pain.  He had onset about 11PM severe left-sided chest pain radiating to the left arm.  This occurred at rest.  There is associated dyspnea, nausea, diaphoresis.  He took nitroglycerin at home without any relief.  He was given additional nitroglycerin as well as intravenous nitroglycerin at [facility] emergency department.  ECG was noted to be significantly changed from prior and arrangements were made to transfer him here.  He continues to have chest pain but denies any dyspnea or nausea currently.  He is a cigarette smoker and does have a strong family history of premature coronary atherosclerosis.  Past Medical History:  Diagnosis Date  . COPD (chronic obstructive pulmonary disease) ([place])   . Coronary artery disease    a. 12/2012 Inf STEMI/Cath/PCI: LM nl, LAD nl, D1 50ost, D2 min irregs, D3 small, LCX 80-18m, OM1/2/3 min irregs, RI 20p, RCA 50p/100d (3.5x18 Xience DEs),PDA/PL nl, EF 45% - complicated by VF/CGS/IABP/VDRF  . GERD (gastroesophageal reflux disease)   . Hiatal hernia   . Hypertension   . Ischemic cardiomyopathy    a. 12/2012 Echo: EF 45-50%, basal inf, inflat, mid inf HK, mildly reduced RV fxn.  . Marijuana abuse   . MI (myocardial infarction) ([place]) 2014  . Obstructive sleep apnea   . Tobacco abuse     Patient Active Problem List   Diagnosis Date Noted  . Wrist fracture, closed 10/09/2018  . Unstable angina ([place]) 09/25/2018  . COPD (chronic obstructive pulmonary disease) ([place])   . Hypertension   . Hyperlipidemia   .  Alcohol use   . Palpitations   . Anal condyloma 01/21/2018  . Bloating 01/21/2018  . GERD (gastroesophageal reflux disease) 01/14/2018  . Esophageal dysphagia 01/14/2018  . Hemorrhoids 10/01/2015  . Mucosal abnormality of esophagus   . Hepatomegaly 04/24/2014  . Dysphagia, pharyngoesophageal phase 04/24/2014  . Hematochezia 04/24/2014  . Ventricular fibrillation ([place]) 01/17/2013  . CAD (coronary artery disease) 01/17/2013  . Hypotension 01/17/2013  . ST elevation myocardial infarction (STEMI) of inferior wall, initial episode of care ([place]) 01/16/2013  . Ventricular tachycardia, sustained ([place]) 01/13/2013  . Acute and chronic respiratory failure 01/13/2013  . Tobacco abuse 01/13/2013  . Heart disease 01/13/2013  . Cardiogenic shock ([place]) 01/13/2013    Past Surgical History:  Procedure Laterality Date  . COLONOSCOPY WITH PROPOFOL N/A 05/14/2014   Dr. Gala Romney: anal canal hemorrhoid, rectosigmoid hyerpplastic polyp, right-sided divetriculosis   . CORONARY ANGIOPLASTY  01/13/2013   STENT TO RCA BY DR COOPER  . DENTAL SURGERY    . ESOPHAGEAL DILATION N/A 05/14/2014   Procedure: ESOPHAGEAL DILATION [place];  Surgeon: Daneil Dolin, MD;  Location: [facility];  Service: Endoscopy;  Laterality: N/A;  . ESOPHAGOGASTRODUODENOSCOPY (EGD) WITH PROPOFOL N/A 05/14/2014   Dr. Gala Romney: empiric dilation, abnormal esophagus s/p biopsy (normal), small hiatal hernia  . LEFT HEART CATH AND CORONARY ANGIOGRAPHY N/A 09/26/2018   Procedure: LEFT HEART CATH AND CORONARY ANGIOGRAPHY;  Surgeon: Lorretta Harp, MD;  Location: [facility];  Service: Cardiovascular;  Laterality: N/A;  .  LEFT HEART CATHETERIZATION WITH CORONARY ANGIOGRAM N/A 01/13/2013   Procedure: LEFT HEART CATHETERIZATION WITH CORONARY ANGIOGRAM;  Surgeon: Wellington Hampshire, MD;  Location: [facility] CATH LAB;  Service: Cardiovascular;  Laterality: N/A;  . NASAL SEPTUM SURGERY    . PERCUTANEOUS CORONARY STENT INTERVENTION (PCI-S)  01/13/2013    Procedure: PERCUTANEOUS CORONARY STENT INTERVENTION (PCI-S);  Surgeon: Wellington Hampshire, MD;  Location: [facility] CATH LAB;  Service: Cardiovascular;;  . POLYPECTOMY  05/14/2014   Procedure: POLYPECTOMY;  Surgeon: Daneil Dolin, MD;  Location: [facility];  Service: Endoscopy;;       Family History  Problem Relation Age of Onset  . Heart attack Other   . Hypertension Other   . Colon cancer Neg Hx     Social History   Tobacco Use  . Smoking status: Current Every Day Smoker    Packs/day: 1.50    Years: 31.00    Pack years: 46.50    Types: Cigarettes    Start date: 11/21/1981  . Smokeless tobacco: Never Used  Vaping Use  . Vaping Use: Former  Substance Use Topics  . Alcohol use: Yes    Alcohol/week: 2.0 standard drinks    Types: 1 Cans of beer, 1 Shots of liquor per week    Comment: social  . Drug use: Yes    Types: Marijuana    Comment: last night    Home Medications Prior to Admission medications   Medication Sig Start Date End Date Taking? Authorizing Provider  albuterol (PROVENTIL HFA;VENTOLIN HFA) 108 (90 Base) MCG/ACT inhaler Inhale 1-2 puffs into the lungs every 6 (six) hours as needed for wheezing or shortness of breath. Patient not taking: Reported on 10/03/2019 01/18/16   Arnoldo Lenis, MD  amLODipine (NORVASC) 10 MG tablet Take 1 tablet (10 mg total) by mouth daily. 10/03/19   Verta Walker., NP  aspirin 81 MG tablet Take 81 mg by mouth every morning.     [provider]  atorvastatin (LIPITOR) 80 MG tablet TAKE 1 TABLET (80 MG TOTAL) BY MOUTH DAILY AT 6 PM. 08/07/19   Branch, Alphonse Guild, MD  losartan (COZAAR) 100 MG tablet Take 1 tablet (100 mg total) by mouth daily. 10/03/19   Verta Walker., NP  metoprolol tartrate (LOPRESSOR) 100 MG tablet Take 1 tablet (100 mg total) by mouth 2 (two) times daily. 01/02/19   Arnoldo Lenis, MD  metoprolol tartrate (LOPRESSOR) 50 MG tablet TAKE 1 TABLET BY MOUTH TWICE A DAY 07/23/19   Arnoldo Lenis, MD   nitroGLYCERIN (NITROSTAT) 0.4 MG SL tablet Place 1 tablet (0.4 mg total) under the tongue every 5 (five) minutes as needed for chest pain. 10/01/18   Arnoldo Lenis, MD  pantoprazole (PROTONIX) 40 MG tablet TAKE 1 TABLET BY MOUTH TWICE A DAY 03/31/19   Arnoldo Lenis, MD  sildenafil (VIAGRA) 100 MG tablet Take 1 tablet (100 mg total) by mouth daily as needed for erectile dysfunction. 01/14/16   Arnoldo Lenis, MD  tiotropium (SPIRIVA HANDIHALER) 18 MCG inhalation capsule Place 1 capsule (18 mcg total) into inhaler and inhale daily. 10/03/19   Verta Walker., NP    Allergies    Patient has no known allergies.  Review of Systems   Review of Systems  Cardiovascular: Positive for chest pain.  All other systems reviewed and are negative.   Physical Exam Updated Vital Signs BP 124/82   Pulse 73   Temp 97.9 F (36.6 C) (Oral)  Resp (!) 28   Ht 5\' 11"  (1.803 m)   Wt 84.8 kg   SpO2 95%   BMI 26.08 kg/m   Physical Exam Vitals and nursing note reviewed.   54 year old male, resting comfortably and in no acute distress. Vital signs are significant for elevated respiratory rate. Oxygen saturation is 99%, which is normal. Head is normocephalic and atraumatic. PERRLA, EOMI. Oropharynx is clear. Neck is nontender and supple without adenopathy or JVD. Back is nontender and there is no CVA tenderness. Lungs are clear without rales, wheezes, or rhonchi. Chest is nontender. Heart has regular rate and rhythm without murmur. Abdomen is soft, flat, nontender without masses or hepatosplenomegaly and peristalsis is normoactive. Extremities have no cyanosis or edema, full range of motion is present. Skin is warm and dry without rash. Neurologic: Mental status is normal, cranial nerves are intact, there are no motor or sensory deficits.  ED Results / Procedures / Treatments   Labs (all labs ordered are listed, but only abnormal results are displayed) Labs Reviewed  BASIC METABOLIC  PANEL - Abnormal; Notable for the following components:      Result Value   Glucose, Bld 111 (*)    All other components within normal limits  CBC WITH DIFFERENTIAL/PLATELET - Abnormal; Notable for the following components:   WBC 11.7 (*)    RBC 4.07 (*)    MCV 101.5 (*)    MCH 34.6 (*)    All other components within normal limits  TROPONIN I (HIGH SENSITIVITY) - Abnormal; Notable for the following components:   Troponin I (High Sensitivity) 728 (*)    All other components within normal limits  SARS CORONAVIRUS 2 BY RT PCR (HOSPITAL ORDER, [place] [facility])  HEPARIN LEVEL (UNFRACTIONATED)  HIV ANTIBODY (ROUTINE TESTING W REFLEX)  TROPONIN I (HIGH SENSITIVITY)    EKG EKG Interpretation  Date/Time:  Tuesday October 07 2019 02:27:13 EDT Ventricular Rate:  75 PR Interval:    QRS Duration: 139 QT Interval:  390 QTC Calculation: 436 R Axis:   6 Text Interpretation: Sinus rhythm Probable left atrial enlargement IVCD, consider atypical LBBB ST-t abnormality worrisome for inferior and lateral ischemia When compared with ECG of 09/25/2018, ST-t abnormality is now present Confirmed by Delora Fuel ([id number]) on 10/07/2019 2:31:42 AM   Radiology DG Chest Port 1 View  Result Date: 10/07/2019 CLINICAL DATA:  54 year old male with chest pain. EXAM: PORTABLE CHEST 1 VIEW COMPARISON:  Chest radiograph dated 10/07/2019. FINDINGS: No focal consolidation, pleural effusion, or pneumothorax. Mild cardiomegaly similar to prior radiograph. There is bilateral hilar vascular prominence likely representing pulmonary hypertension. No acute osseous pathology. IMPRESSION: No acute cardiopulmonary process.  No interval change. Electronically Signed   By: Anner Crete M.D.   On: 10/07/2019 03:34    Procedures Procedures  CRITICAL CARE Performed by: Delora Fuel Total critical care time: 45 minutes Critical care time was exclusive of separately billable procedures and treating other  patients. Critical care was necessary to treat or prevent imminent or life-threatening deterioration. Critical care was time spent personally by me on the following activities: development of treatment plan with patient and/or surrogate as well as nursing, discussions with consultants, evaluation of patient's response to treatment, examination of patient, obtaining history from patient or surrogate, ordering and performing treatments and interventions, ordering and review of laboratory studies, ordering and review of radiographic studies, pulse oximetry and re-evaluation of patient's condition.  Medications Ordered in ED Medications  nitroGLYCERIN 50 mg in dextrose  5 % 250 mL (0.2 mg/mL) infusion (160 mcg/min Intravenous Rate/Dose Change 10/07/19 0648)  heparin ADULT infusion 100 units/mL (25000 units/276mL sodium chloride 0.45%) (1,100 Units/hr Intravenous New Bag/Given 10/07/19 0344)  amLODipine (NORVASC) tablet 10 mg (has no administration in time range)  atorvastatin (LIPITOR) tablet 80 mg (has no administration in time range)  losartan (COZAAR) tablet 100 mg (has no administration in time range)  metoprolol tartrate (LOPRESSOR) tablet 100 mg (has no administration in time range)  nitroGLYCERIN (NITROSTAT) SL tablet 0.4 mg (has no administration in time range)  pantoprazole (PROTONIX) EC tablet 40 mg (has no administration in time range)  aspirin EC tablet 81 mg (has no administration in time range)  nitroGLYCERIN (NITROSTAT) SL tablet 0.4 mg (has no administration in time range)  acetaminophen (TYLENOL) tablet 650 mg (has no administration in time range)  ondansetron (ZOFRAN) injection 4 mg (has no administration in time range)  sodium chloride flush (NS) 0.9 % injection 3 mL (has no administration in time range)  sodium chloride flush (NS) 0.9 % injection 3 mL (has no administration in time range)  0.9 %  sodium chloride infusion (has no administration in time range)  aspirin chewable tablet  81 mg (has no administration in time range)  0.9% sodium chloride infusion (has no administration in time range)    Followed by  0.9% sodium chloride infusion (has no administration in time range)  morphine 4 MG/ML injection 4 mg (4 mg Intravenous Given 10/07/19 0340)  heparin bolus via infusion 4,000 Units (4,000 Units Intravenous Bolus from Bag 10/07/19 0347)    ED Course  I have reviewed the triage vital signs and the nursing notes.  Pertinent labs & imaging results that were available during my care of the patient were reviewed by me and considered in my medical decision making (see chart for details).  MDM Rules/Calculators/A&P Chest pain in patient with known history of coronary artery disease.  ECG shows new ischemic changes in the inferior and anterolateral leads.  Old records are reviewed, and cardiac catheterization 09/26/2018 showed a patent stent in the right coronary artery, 90% stenosis in the mid circumflex artery.  This is likely the culprit vessel.  Patient was seen in conjunction with Dr. Ailene Ravel of cardiology service.  He was not on heparin or nitroglycerin when he arrived, but had been given aspirin.  He is placed back on heparin and nitroglycerin and is given morphine for pain.  Troponin at [facility] was normal, will repeat here.  He will need to be admitted by the cardiology service.  Troponin has come back moderately elevated, over 700.  Cardiology is planning on cardiac catheterization.  Final Clinical Impression(s) / ED Diagnoses Final diagnoses:  Acute coronary syndrome [facility])    Rx / DC Orders ED Discharge Orders    None       Delora Fuel, MD 37/94/32 [phone]

## 2019-10-07 NOTE — Progress Notes (Signed)
ANTICOAGULATION CONSULT NOTE - Initial Consult  Pharmacy Consult for heparin Indication: chest pain/ACS  No Known Allergies  Patient Measurements: Height: 5\' 11"  (180.3 cm) Weight: 84.8 kg (187 lb) IBW/kg (Calculated) : 75.3   Medical History: Past Medical History:  Diagnosis Date  . COPD (chronic obstructive pulmonary disease) ([place])   . Coronary artery disease    a. 12/2012 Inf STEMI/Cath/PCI: LM nl, LAD nl, D1 50ost, D2 min irregs, D3 small, LCX 80-26m, OM1/2/3 min irregs, RI 20p, RCA 50p/100d (3.5x18 Xience DEs),PDA/PL nl, EF 86% - complicated by VF/CGS/IABP/VDRF  . GERD (gastroesophageal reflux disease)   . Hiatal hernia   . Hypertension   . Ischemic cardiomyopathy    a. 12/2012 Echo: EF 45-50%, basal inf, inflat, mid inf HK, mildly reduced RV fxn.  . Marijuana abuse   . MI (myocardial infarction) ([place]) 2014  . Obstructive sleep apnea   . Tobacco abuse     Assessment: 54yo male w/ known h/o CAD c/o CP radiating to LUE, to begin heparin.  Goal of Therapy:  Heparin level 0.3-0.7 units/ml Monitor platelets by anticoagulation protocol: Yes   Plan:  Will give heparin 4000 units IV bolus followed by gtt at 1100 units/hr and monitor heparin levels and CBC.  Wynona Neat, PharmD, BCPS  10/07/2019,2:58 AM

## 2019-10-07 NOTE — ED Notes (Signed)
O2 incr to [facility] for comfort

## 2019-10-07 NOTE — ED Triage Notes (Signed)
BIB [facility] after c/o C/P that started 2300 10/06/19. PT was brought to to [place] changes were noted buy [facility] staff and cards was consulted at [facility]. Decision was made to transported pt to [facility]. Upon arrival, pt c/p is 9/10 with radiation to left arm. B/P 138/87, HR 78, 02 99% with 3 L [place]. PT has hx of MI, stent placement.

## 2019-10-07 NOTE — Progress Notes (Signed)
Cardiology Progress Note:  Pt seen in the ED this am by our cardiology fellow. Admission orders placed.  Chest pain began at 11 pm. Similar to prior cardiac pain.  Pain has persisted through the night.  EKG with sinus, inferior and anterolateral ST depression, new since his EKG 4 days ago HsTroponin 728 Ongoing pain this am.  Plan for cardiac cath second case today.  Continue IV NTG and IV heparin. Morphine now for pain.    I have reviewed the risks, indications, and alternatives to cardiac catheterization, possible angioplasty, and stenting with the patient. Risks include but are not limited to bleeding, infection, vascular injury, stroke, myocardial infection, arrhythmia, kidney injury, radiation-related injury in the case of prolonged fluoroscopy use, emergency cardiac surgery, and death. The patient understands the risks of serious complication is 1-2 in 4599 with diagnostic cardiac cath and 1-2% or less with angioplasty/stenting.  Lauree Chandler 10/07/2019 8:23 AM

## 2019-10-07 NOTE — Progress Notes (Signed)
Called to bedside for shortness of breath. While reviewing telemetry, noted a run of what appears to be narrow complex tachycardia at 1645 today. Telemetry reviewed with Dr. Audie Box who determined this was likely atrial tachcyardia. No indication for anticoagulation at this time. Pt is having frequent PVCs. Will add on Mg level. Reviewed CXR. Will hold off on additional doses of lasix at this time. Pt is sitting up, eating, with O2 sats in the 90s on room air. He is symptomatic with his PVCs. Will give scheduled 100 mg lopressor early. Also ordered low dose seroquel for sleep tonight. Patient states he does not do well with benzos. Pt is comfortable with this plan.

## 2019-10-07 NOTE — ED Notes (Signed)
Translucent pads placed on pt

## 2019-10-07 NOTE — H&P (Signed)
Cardiology Admission History and Physical:   Patient ID: Timothy Walker MRN: [id number]; DOB: February 26, 1966   Admission date: 10/07/2019  Primary Care Provider: Sinda Du, MD [facility] Cardiologist: Carlyle Dolly, MD  [facility] Electrophysiologist:  None   Chief Complaint: Chest pain   Patient Profile:   54M with known obstructive CAD s/p STEMI (12/2012, DES RCA), VT/VF arrest, HTN, HLD, tobacco use, HFrEF (EF 45-50%), COPD, and GERD who presents with acute onset chest/arm pain.   History of Present Illness:   Timothy Walker reports acute onset 10/10 central stabbing chest pain that started late last night and was persisted despite 2 aspirin and 2 sublingual nitroglycerin.  The pain is slightly to the right of the middle of his chest and he feels like it wraps around the bottom of his heart and goes down his left arm.  This is similar in character to prior episodes of anginal pain.  He has previously been stented to his RCA for inferior STEMI and had an unrepaired 90% mid LCx lesion.  He was cathed 1 year ago and the LCx lesion was stable from prior so no lesions were intervened upon.  He had mild nausea or diaphoresis with onset of chest pain which prompted him to go to for additional evaluation at [facility].  ECG significant for diffuse deep ST depressions that are significantly different from prior.  He had active chest pain at the outside emergency department and was given additional aspirin, sublingual and nitroglycerin, and IV heparin.  He reports that his symptoms were mildly improved with this combination however  Past Medical History:  Diagnosis Date  . COPD (chronic obstructive pulmonary disease) ([place])   . Coronary artery disease    a. 12/2012 Inf STEMI/Cath/PCI: LM nl, LAD nl, D1 50ost, D2 min irregs, D3 small, LCX 80-26m, OM1/2/3 min irregs, RI 20p, RCA 50p/100d (3.5x18 Xience DEs),PDA/PL nl, EF 74% - complicated by VF/CGS/IABP/VDRF  . GERD (gastroesophageal  reflux disease)   . Hiatal hernia   . Hypertension   . Ischemic cardiomyopathy    a. 12/2012 Echo: EF 45-50%, basal inf, inflat, mid inf HK, mildly reduced RV fxn.  . Marijuana abuse   . MI (myocardial infarction) ([place]) 2014  . Obstructive sleep apnea   . Tobacco abuse    Past Surgical History:  Procedure Laterality Date  . COLONOSCOPY WITH PROPOFOL N/A 05/14/2014   Dr. Gala Romney: anal canal hemorrhoid, rectosigmoid hyerpplastic polyp, right-sided divetriculosis   . CORONARY ANGIOPLASTY  01/13/2013   STENT TO RCA BY DR COOPER  . DENTAL SURGERY    . ESOPHAGEAL DILATION N/A 05/14/2014   Procedure: ESOPHAGEAL DILATION [place];  Surgeon: Daneil Dolin, MD;  Location: [facility];  Service: Endoscopy;  Laterality: N/A;  . ESOPHAGOGASTRODUODENOSCOPY (EGD) WITH PROPOFOL N/A 05/14/2014   Dr. Gala Romney: empiric dilation, abnormal esophagus s/p biopsy (normal), small hiatal hernia  . LEFT HEART CATH AND CORONARY ANGIOGRAPHY N/A 09/26/2018   Procedure: LEFT HEART CATH AND CORONARY ANGIOGRAPHY;  Surgeon: Lorretta Harp, MD;  Location: [place] CV LAB;  Service: Cardiovascular;  Laterality: N/A;  . LEFT HEART CATHETERIZATION WITH CORONARY ANGIOGRAM N/A 01/13/2013   Procedure: LEFT HEART CATHETERIZATION WITH CORONARY ANGIOGRAM;  Surgeon: Wellington Hampshire, MD;  Location: [place] CATH LAB;  Service: Cardiovascular;  Laterality: N/A;  . NASAL SEPTUM SURGERY    . PERCUTANEOUS CORONARY STENT INTERVENTION (PCI-S)  01/13/2013   Procedure: PERCUTANEOUS CORONARY STENT INTERVENTION (PCI-S);  Surgeon: Wellington Hampshire, MD;  Location: [facility] CATH  LAB;  Service: Cardiovascular;;  . POLYPECTOMY  05/14/2014   Procedure: POLYPECTOMY;  Surgeon: Daneil Dolin, MD;  Location: [facility];  Service: Endoscopy;;    Medications Prior to Admission: Prior to Admission medications   Medication Sig Start Date End Date Taking? Authorizing Provider  amLODipine (NORVASC) 10 MG tablet Take 1 tablet (10 mg total) by mouth daily. Patient  taking differently: Take 10 mg by mouth at bedtime.  10/03/19  Yes Verta Ellen., NP  aspirin 81 MG tablet Take 81 mg by mouth daily.    Yes [provider]  atorvastatin (LIPITOR) 80 MG tablet TAKE 1 TABLET (80 MG TOTAL) BY MOUTH DAILY AT 6 PM. 08/07/19  Yes Branch, Alphonse Guild, MD  losartan (COZAAR) 100 MG tablet Take 1 tablet (100 mg total) by mouth daily. 10/03/19  Yes Verta Ellen., NP  metoprolol tartrate (LOPRESSOR) 100 MG tablet Take 1 tablet (100 mg total) by mouth 2 (two) times daily. 01/02/19  Yes Branch, Alphonse Guild, MD  metoprolol tartrate (LOPRESSOR) 50 MG tablet TAKE 1 TABLET BY MOUTH TWICE A DAY Patient taking differently: Take 50 mg by mouth 2 (two) times daily.  07/23/19  Yes Branch, Alphonse Guild, MD  nitroGLYCERIN (NITROSTAT) 0.4 MG SL tablet Place 1 tablet (0.4 mg total) under the tongue every 5 (five) minutes as needed for chest pain. 10/01/18  Yes Branch, Alphonse Guild, MD  pantoprazole (PROTONIX) 40 MG tablet TAKE 1 TABLET BY MOUTH TWICE A DAY Patient taking differently: Take 40 mg by mouth 2 (two) times daily.  03/31/19  Yes BranchAlphonse Guild, MD  sildenafil (VIAGRA) 100 MG tablet Take 1 tablet (100 mg total) by mouth daily as needed for erectile dysfunction. 01/14/16  Yes Arnoldo Lenis, MD  tiotropium (SPIRIVA HANDIHALER) 18 MCG inhalation capsule Place 1 capsule (18 mcg total) into inhaler and inhale daily. Patient taking differently: Place 18 mcg into inhaler and inhale at bedtime.  10/03/19  Yes Verta Ellen., NP  albuterol (PROVENTIL HFA;VENTOLIN HFA) 108 (90 Base) MCG/ACT inhaler Inhale 1-2 puffs into the lungs every 6 (six) hours as needed for wheezing or shortness of breath. Patient not taking: Reported on 10/07/2019 01/18/16   Arnoldo Lenis, MD    Allergies:   No Known Allergies  Social History:   Social History   Socioeconomic History  . Marital status: Divorced    Spouse name: Not on file  . Number of children: Not on file  . Years of  education: Not on file  . Highest education level: Not on file  Occupational History  . Not on file  Tobacco Use  . Smoking status: Current Every Day Smoker    Packs/day: 1.50    Years: 31.00    Pack years: 46.50    Types: Cigarettes    Start date: 11/21/1981  . Smokeless tobacco: Never Used  Vaping Use  . Vaping Use: Former  Substance and Sexual Activity  . Alcohol use: Yes    Alcohol/week: 2.0 standard drinks    Types: 1 Cans of beer, 1 Shots of liquor per week    Comment: social  . Drug use: Yes    Types: Marijuana    Comment: last night  . Sexual activity: Yes  Other Topics Concern  . Not on file  Social History Narrative  . Not on file   Social Determinants of Health   Financial Resource Strain:   . Difficulty of Paying Living Expenses:   Food Insecurity:   .  Worried About Charity fundraiser in the Last Year:   . Arboriculturist in the Last Year:   Transportation Needs:   . Film/video editor (Medical):   Marland Kitchen Lack of Transportation (Non-Medical):   Physical Activity:   . Days of Exercise per Week:   . Minutes of Exercise per Session:   Stress:   . Feeling of Stress :   Social Connections:   . Frequency of Communication with Friends and Family:   . Frequency of Social Gatherings with Friends and Family:   . Attends Religious Services:   . Active Member of Clubs or Organizations:   . Attends Archivist Meetings:   Marland Kitchen Marital Status:   Intimate Partner Violence:   . Fear of Current or Ex-Partner:   . Emotionally Abused:   Marland Kitchen Physically Abused:   . Sexually Abused:     Family History: No family hx of premature CAD or SCD The patient's family history includes Heart attack in an other family member; Hypertension in an other family member. There is no history of Colon cancer.    ROS:   Review of Systems: [y] = yes, [ ]  = no       General: Weight gain [ ] ; Weight loss [ ] ; Anorexia [ ] ; Fatigue [ ] ; Fever [ ] ; Chills [ ] ; Weakness [ ]      Cardiac: Chest pain/pressure [y]; Resting SOB [ ] ; Exertional SOB [ ] ; Orthopnea [ ] ; Pedal Edema [ ] ; Palpitations [ ] ; Syncope [ ] ; Presyncope [ ] ; Paroxysmal nocturnal dyspnea [ ]     Pulmonary: Cough [ ] ; Wheezing [ ] ; Hemoptysis [ ] ; Sputum [ ] ; Snoring [ ]     GI: Vomiting [ ] ; Dysphagia [ ] ; Melena [ ] ; Hematochezia [ ] ; Heartburn [ ] ; Abdominal pain [ ] ; Constipation [ ] ; Diarrhea [ ] ; BRBPR [ ]     GU: Hematuria [ ] ; Dysuria [ ] ; Nocturia [ ]   Vascular: Pain in legs with walking [ ] ; Pain in feet with lying flat [ ] ; Non-healing sores [ ] ; Stroke [ ] ; TIA [ ] ; Slurred speech [ ] ;    Neuro: Headaches [ ] ; Vertigo [ ] ; Seizures [ ] ; Paresthesias [ ] ;Blurred vision [ ] ; Diplopia [ ] ; Vision changes [ ]     Ortho/Skin: Arthritis [ ] ; Joint pain [ ] ; Muscle pain [ ] ; Joint swelling [ ] ; Back Pain [ ] ; Rash [ ]     Psych: Depression [ ] ; Anxiety [ ]     Heme: Bleeding problems [ ] ; Clotting disorders [ ] ; Anemia [ ]     Endocrine: Diabetes [ ] ; Thyroid dysfunction [ ]    Physical Exam/Data:   Vitals:   10/07/19 0330 10/07/19 0430 10/07/19 0445 10/07/19 0515  BP: (!) 152/103 (!) 147/105 (!) 139/97 (!) 141/93  Pulse: 80 80 73 76  Resp: (!) 23 20 (!) 21 (!) 22  Temp:      TempSrc:      SpO2: 100% 98% 99% 98%  Weight:      Height:       No intake or output data in the 24 hours ending 10/07/19 0528 Last 3 Weights 10/07/2019 10/03/2019 10/23/2018  Weight (lbs) 187 lb 187 lb 185 lb  Weight (kg) 84.823 kg 84.823 kg 83.915 kg     Body mass index is 26.08 kg/m.  General:  Well nourished, well developed, in no acute distress HEENT: normal Lymph: no adenopathy Neck: no JVD Endocrine:  No thryomegaly Vascular: No  carotid bruits; FA pulses 2+ bilaterally without bruits  Cardiac:  normal S1, S2; RRR; no murmur Lungs:  clear to auscultation bilaterally, no wheezing, rhonchi or rales  Abd: soft, nontender, no hepatomegaly  Ext: no LE edema Musculoskeletal:  No deformities, BUE and BLE  strength normal and equal Skin: warm and dry  Neuro:  CNs 2-12 intact, no focal abnormalities noted Psych:  Normal affect   EKG:  The ECG that was done and revealed diffuse ST depressions significantly different from prior comparisons along with aVL elevation but no elevation in lead I.   Relevant CV Studies:  TTE Result date: 09/26/18 1. The left ventricle has mildly reduced systolic function, with an  ejection fraction of 45-50%. The cavity size was mildly dilated. There is mildly increased left ventricular wall thickness. Left ventricular diastolic Doppler parameters are consistent  with impaired relaxation.  2. There is akinesis of the left ventricular, basal-mid inferior wall.  3. The right ventricle has normal systolic function. The cavity was normal. There is no increase in right ventricular wall thickness. Right ventricular systolic pressure is mildly elevated with an estimated pressure of 39.8 mmHg.  4. The mitral valve is grossly normal. There is mild mitral  regurgitation.  5. The tricuspid valve is grossly normal.  6. The aortic valve is tricuspid.  7. The aortic root is normal in size and structure.  8. The inferior vena cava was dilated in size with >50% respiratory variability.   Coronary angiography Result date: 09/26/18  Previously placed Mid RCA to Dist RCA stent (unknown type) is widely patent.  Mid Cx lesion is 90% stenosed.  Mid LAD lesion is 30% stenosed.  Prox RCA lesion is 30% stenosed.  Ramus lesion is 30% stenosed.  There is moderate left ventricular systolic dysfunction.  LV end diastolic pressure is mildly elevated.  The left ventricular ejection fraction is 35-45% by visual estimate.  Laboratory Data:  High Sensitivity Troponin:  No results for input(s): TROPONINIHS in the last 720 hours.    ChemistryNo results for input(s): NA, K, CL, CO2, GLUCOSE, BUN, CREATININE, CALCIUM, GFRNONAA, GFRAA, ANIONGAP in the last 168 hours.  No results  for input(s): PROT, ALBUMIN, AST, ALT, ALKPHOS, BILITOT in the last 168 hours. Hematology Recent Labs  Lab 10/07/19 0420  WBC 11.7*  RBC 4.07*  HGB 14.1  HCT 41.3  MCV 101.5*  MCH 34.6*  MCHC 34.1  RDW 12.7  PLT 228   BNPNo results for input(s): BNP, PROBNP in the last 168 hours.  DDimer No results for input(s): DDIMER in the last 168 hours.  Radiology/Studies:  DG Chest Port 1 View  Result Date: 10/07/2019 CLINICAL DATA:  54 year old male with chest pain. EXAM: PORTABLE CHEST 1 VIEW COMPARISON:  Chest radiograph dated 10/07/2019. FINDINGS: No focal consolidation, pleural effusion, or pneumothorax. Mild cardiomegaly similar to prior radiograph. There is bilateral hilar vascular prominence likely representing pulmonary hypertension. No acute osseous pathology. IMPRESSION: No acute cardiopulmonary process.  No interval change. Electronically Signed   By: Anner Crete M.D.   On: 10/07/2019 03:34   HEART score (7)  Assessment and Plan:   1. NSTEMI Timothy Walker with known obstructive CAD and prior STEMI presenting with similar sx and dynamic ST changes consistent with NSTEMI. Chest pain controlled with IV NG.  He has ongoing tobacco use and we discussed the importance of cessation.  He is much more interested in considering this now that he has had recurrent episodes of chest pain.  He has multiple  life stressors at home that he feels had to his tobacco use.  I am concerned that given history of 90% mLCx lesion he likely has had worsening plaque rupture and subsequent NSTEMI.  - continue home ASA 81 mg PO daily, s/p 324 mg PO  - continue home metop tartrate 100 mg PO bid - continue home losartan 100 mg PO daily  - continue home atorva 80 mg PO qhs - heparin gtt - NG gtt - cors angio today  2. HTN - continue all above home meds - continue home amlodipine 10 mg PO daily   3. Tobacco abuse Patient contemplating quitting now. Interested in Ch[place]ix on discharge.   Severity of  Illness: The appropriate patient status for this patient is INPATIENT. Inpatient status is judged to be reasonable and necessary in order to provide the required intensity of service to ensure the patient's safety. The patient's presenting symptoms, physical exam findings, and initial radiographic and laboratory data in the context of their chronic comorbidities is felt to place them at high risk for further clinical deterioration. Furthermore, it is not anticipated that the patient will be medically stable for discharge from the hospital within 2 midnights of admission. The following factors support the patient status of inpatient.   " The patient's presenting symptoms include chest pain. " The worrisome physical exam findings include n/a " The initial radiographic and laboratory data are worrisome because of dynamic ST changes " The chronic co-morbidities include CAD, HTN, HLD, tobacco use  * I certify that at the point of admission it is my clinical judgment that the patient will require inpatient hospital care spanning beyond 2 midnights from the point of admission due to high intensity of service, high risk for further deterioration and high frequency of surveillance required.*   For questions or updates, please contact [facility] Please consult [email or web address] for contact info under   Signed, Dion Body, MD  10/07/2019 5:28 AM

## 2019-10-07 NOTE — ED Notes (Signed)
Pt undressed, all belongings in 3 bags. Glasses wrapped in tissue paper, inside of empty urinal, labeled do not discard.

## 2019-10-08 ENCOUNTER — Inpatient Hospital Stay (HOSPITAL_COMMUNITY): Payer: BC Managed Care – PPO

## 2019-10-08 ENCOUNTER — Encounter (HOSPITAL_COMMUNITY): Payer: Self-pay | Admitting: Cardiology

## 2019-10-08 DIAGNOSIS — I214 Non-ST elevation (NSTEMI) myocardial infarction: Principal | ICD-10-CM

## 2019-10-08 DIAGNOSIS — I340 Nonrheumatic mitral (valve) insufficiency: Secondary | ICD-10-CM

## 2019-10-08 DIAGNOSIS — I493 Ventricular premature depolarization: Secondary | ICD-10-CM

## 2019-10-08 LAB — BASIC METABOLIC PANEL
Anion gap: 15 (ref 5–15)
BUN: 8 mg/dL (ref 6–20)
CO2: 23 mmol/L (ref 22–32)
Calcium: 8.8 mg/dL — ABNORMAL LOW (ref 8.9–10.3)
Chloride: 99 mmol/L (ref 98–111)
Creatinine, Ser: 0.8 mg/dL (ref 0.61–1.24)
GFR calc Af Amer: >60 mL/min (ref >60)
GFR calc non Af Amer: >60 mL/min (ref >60)
Glucose, Bld: 93 mg/dL (ref 70–99)
Potassium: 3.2 mmol/L — ABNORMAL LOW (ref 3.5–5.1)
Sodium: 137 mmol/L (ref 135–145)

## 2019-10-08 LAB — CBC
HCT: 41.4 % (ref 39.0–52.0)
Hemoglobin: 14 g/dL (ref 13.0–17.0)
MCH: 34.1 pg — ABNORMAL HIGH (ref 26.0–34.0)
MCHC: 33.8 g/dL (ref 30.0–36.0)
MCV: 101 fL — ABNORMAL HIGH (ref 80.0–100.0)
Platelets: 186 10*3/uL (ref 150–400)
RBC: 4.1 MIL/uL — ABNORMAL LOW (ref 4.22–5.81)
RDW: 12.9 % (ref 11.5–15.5)
WBC: 10.1 10*3/uL (ref 4.0–10.5)
nRBC: 0 % (ref 0.0–0.2)

## 2019-10-08 LAB — ECHOCARDIOGRAM COMPLETE
Height: 71 in
Weight: 2874.8 oz

## 2019-10-08 MED ORDER — MAGNESIUM SULFATE 2 GM/50ML IV SOLN
2.0000 g | Freq: Once | INTRAVENOUS | Status: AC
  Administered 2019-10-08: 2 g via INTRAVENOUS
  Filled 2019-10-08: qty 50

## 2019-10-08 MED ORDER — LOSARTAN POTASSIUM 50 MG PO TABS: 50.0000 mg | ORAL_TABLET | Freq: Every day | ORAL | Status: DC

## 2019-10-08 MED ORDER — METOPROLOL TARTRATE 50 MG PO TABS
150.0000 mg | ORAL_TABLET | Freq: Two times a day (BID) | ORAL | Status: DC
  Administered 2019-10-08 – 2019-10-09 (×3): 150 mg via ORAL
  Filled 2019-10-08 (×3): qty 1

## 2019-10-08 MED ORDER — POTASSIUM CHLORIDE CRYS ER 20 MEQ PO TBCR
40.0000 meq | EXTENDED_RELEASE_TABLET | ORAL | Status: DC
  Administered 2019-10-08: 40 meq via ORAL
  Filled 2019-10-08: qty 2

## 2019-10-08 MED ORDER — LOSARTAN POTASSIUM 50 MG PO TABS
100.0000 mg | ORAL_TABLET | Freq: Every day | ORAL | Status: DC
  Administered 2019-10-08 – 2019-10-09 (×2): 100 mg via ORAL
  Filled 2019-10-08 (×2): qty 2

## 2019-10-08 MED ORDER — POTASSIUM CHLORIDE CRYS ER 20 MEQ PO TBCR
40.0000 meq | EXTENDED_RELEASE_TABLET | ORAL | Status: AC
  Administered 2019-10-08 (×2): 40 meq via ORAL
  Filled 2019-10-08 (×2): qty 2

## 2019-10-08 NOTE — Progress Notes (Signed)
  Echocardiogram 2D Echocardiogram has been performed.  Randa Lynn Taryn Shellhammer 10/08/2019, 10:49 AM

## 2019-10-08 NOTE — Progress Notes (Signed)
CARDIAC REHAB PHASE I   PRE:  Rate/Rhythm: 81 SR with PVCs    BP: lying 110/76, sitting after bathroom 159/110    SaO2:   MODE:  Ambulation: 470 ft   POST:  Rate/Rhythm: 92 SR with PVCs    BP: sitting 143/109     SaO2:   Pt ambulated without CP or significant SOB. His main c/o is palpitations that do correlate with his PVCs. His BP is elevated. Long discussion of MI, stent, Brilinta, smoking cessation, diet, exercise, NTG, stress relief, and CRPII. Pt is quite depressed. I encouraged him to get a PCP and discuss his depression. Also encouraged counseling. Will refer to [facility] however pt will probably not be able to do due to work. He needs a PCP. He wants to quit smoking but does have significant stress.  Seba Dalkai, ACSM 10/08/2019 9:34 AM

## 2019-10-08 NOTE — Progress Notes (Addendum)
Progress Note  Patient Name: Timothy Walker Date of Encounter: 10/08/2019  [facility] Cardiologist: Carlyle Dolly, MD   Subjective   Intermittent shortness of breath. No chest pain.   Inpatient Medications    Scheduled Meds: . amLODipine  10 mg Oral Daily  . aspirin EC  81 mg Oral Daily  . atorvastatin  80 mg Oral q1800  . losartan  100 mg Oral Daily  . metoprolol tartrate  100 mg Oral BID  . nicotine  14 mg Transdermal Daily  . pantoprazole  40 mg Oral BID  . potassium chloride  40 mEq Oral Q4H  . QUEtiapine  25 mg Oral QHS  . sodium chloride flush  3 mL Intravenous Q12H  . ticagrelor  90 mg Oral BID  . umeclidinium bromide  1 puff Inhalation Daily   Continuous Infusions: . sodium chloride    . magnesium sulfate bolus IVPB     PRN Meds: sodium chloride, acetaminophen, ondansetron (ZOFRAN) IV, sodium chloride flush   Vital Signs    Vitals:   10/08/19 0025 10/08/19 0453 10/08/19 0740 10/08/19 0757  BP: 120/88 (!) 145/100  110/76  Pulse: 82 82  78  Resp: 18 18  18   Temp: 98 F (36.7 C) 98.5 F (36.9 C)  97.9 F (36.6 C)  TempSrc:  Oral  Oral  SpO2:  94% 95% 95%  Weight:  81.5 kg    Height:        Intake/Output Summary (Last 24 hours) at 10/08/2019 0805 Last data filed at 10/07/2019 2100 Gross per 24 hour  Intake 360 ml  Output 400 ml  Net -40 ml   Last 3 Weights 10/08/2019 10/07/2019 10/07/2019  Weight (lbs) 179 lb 10.8 oz 183 lb 9.6 oz 187 lb  Weight (kg) 81.5 kg 83.28 kg 84.823 kg      Telemetry    NSR with frequent PVCS - Personally Reviewed  ECG    NSR with PVC - Personally Reviewed  Physical Exam   GEN: No acute distress.   Neck: No JVD Cardiac: RRR, no murmurs, rubs, or gallops. Radial cath site stable.  Respiratory: Clear to auscultation bilaterally. GI: Soft, nontender, non-distended  MS: No edema; No deformity. Neuro:  Nonfocal  Psych: Normal affect   Labs    High Sensitivity Troponin:   Recent Labs  Lab 10/07/19 0420  10/07/19 0641  TROPONINIHS 728* 1,924*      Chemistry Recent Labs  Lab 10/07/19 0420 10/08/19 0506  NA 139 137  K 4.1 3.2*  CL 104 99  CO2 23 23  GLUCOSE 111* 93  BUN 10 8  CREATININE 0.95 0.80  CALCIUM 9.0 8.8*  GFRNONAA >60 >60  GFRAA >60 >60  ANIONGAP 12 15     Hematology Recent Labs  Lab 10/07/19 0420 10/08/19 0506  WBC 11.7* 10.1  RBC 4.07* 4.10*  HGB 14.1 14.0  HCT 41.3 41.4  MCV 101.5* 101.0*  MCH 34.6* 34.1*  MCHC 34.1 33.8  RDW 12.7 12.9  PLT 228 186    BNPNo results for input(s): BNP, PROBNP in the last 168 hours.   DDimer No results for input(s): DDIMER in the last 168 hours.   Radiology    CARDIAC CATHETERIZATION  Result Date: 10/07/2019  There is mild left ventricular systolic dysfunction. The left ventricular ejection fraction is 45-50% by visual LV end diastolic pressure is normal.  Coronary anatomy:  Prox RCA lesion is 30% stenosed. Previously placed Mid RCA to Dist RCA stent (unknown type)  is widely patent.  Ramus lesion is 30% stenosed.  Prox Cx to Dist Cx lesion is 100% stenosed with 99% stenosed side branch in 1st Mrg. LPAV lesion is 80% stenosed.  A drug-eluting stent was successfully placed crossing from the small 1st Mrg into the LCx-2nd Mrg, using a SYNERGY XD 2.50X20.  Post intervention, there is a 0% residual stenosis.  Post intervention, the side branch was reduced to 99% residual stenosis.  A drug-eluting stent was successfully placed using a SYNERGY XD 2.50X20.  SUMMARY  Two-Vessel Disease: With widely patent distal RCA stent and Toprol lesion being 100% proximal to mid LCx occlusion proximal to very small caliber 1st Mrg & AVG LCx.  Difficult lesion to cross, but successful PTCA and DES PCI of the acutely occluded LCx using Synergy DES 2.5 mm x 20 mm-postdilated to 3.1-2.9 mm  Mild proximal RCA and LAD disease.  Mildly reduced EF of 45% with lateral hypokinesis.  Sustained ventricular tachycardia related to angiography--s/p 1  shock DCCV restoring sinus rhythm RECOMMENDATIONS  Restart postop medications but will reduce beta-blocker in half starting tonight. ->  Will need to determine appropriateness of home doses, but will dose other medications as of tomorrow.  Continue home statin  Cardiac rehab recommendation/consultation team to see the patient from hospital. Glenetta Hew, MD  DG Chest [facility] 1 View  Result Date: 10/07/2019 CLINICAL DATA:  54 year old male with chest pain. EXAM: PORTABLE CHEST 1 VIEW COMPARISON:  Chest radiograph dated 10/07/2019. FINDINGS: No focal consolidation, pleural effusion, or pneumothorax. Mild cardiomegaly similar to prior radiograph. There is bilateral hilar vascular prominence likely representing pulmonary hypertension. No acute osseous pathology. IMPRESSION: No acute cardiopulmonary process.  No interval change. Electronically Signed   By: Anner Crete M.D.   On: 10/07/2019 03:34    Cardiac Studies   Coronary/Graft Acute MI Revascularization  CORONARY STENT INTERVENTION  LEFT HEART CATH AND CORONARY ANGIOGRAPHY  Conclusion    There is mild left ventricular systolic dysfunction. The left ventricular ejection fraction is 45-50% by visual LV end diastolic pressure is normal.  Coronary anatomy:  Prox RCA lesion is 30% stenosed. Previously placed Mid RCA to Dist RCA stent (unknown type) is widely patent.  Ramus lesion is 30% stenosed.  Prox Cx to Dist Cx lesion is 100% stenosed with 99% stenosed side branch in 1st Mrg. LPAV lesion is 80% stenosed.  A drug-eluting stent was successfully placed crossing from the small 1st Mrg into the LCx-2nd Mrg, using a SYNERGY XD 2.50X20.  Post intervention, there is a 0% residual stenosis.  Post intervention, the side branch was reduced to 99% residual stenosis.  A drug-eluting stent was successfully placed using a SYNERGY XD 2.50X20.   SUMMARY  Two-Vessel Disease: With widely patent distal RCA stent and Toprol lesion being 100%  proximal to mid LCx occlusion proximal to very small caliber 1st Mrg & AVG LCx. ? Difficult lesion to cross, but successful PTCA and DES PCI of the acutely occluded LCx using Synergy DES 2.5 mm x 20 mm-postdilated to 3.1-2.9 mm  Mild proximal RCA and LAD disease.  Mildly reduced EF of 45% with lateral hypokinesis.  Sustained ventricular tachycardia related to angiography--s/p 1 shock DCCV restoring sinus rhythm   RECOMMENDATIONS  Restart postop medications but will reduce beta-blocker in half starting tonight. ->  Will need to determine appropriateness of home doses, but will dose other medications as of tomorrow.  Continue home statin  Cardiac rehab recommendation/consultation team to see the patient from hospital.  Diagnostic Dominance:  Right  Intervention     Patient Profile     54 y.o. male with known obstructive CAD s/p STEMI (12/2012, DES RCA), VT/VF arrest, HTN, HLD, tobacco use, HFrEF (EF 45-50%), COPD, and GERD admitted for chest pain.   Assessment & Plan    1. NSTEMI - Hs-troponin 728>>1924. EKG with sinus, inferior and anterolateral ST depression, new since his EKG 4 days ago. Treated with heparin. Cath showed patent RCA and 100% occluded proximal to mid LCX s/p Difficult lesion to cross, but successful PTCA and DES PCI of the acutely occluded LCx using Synergy DES 2.5 mm x 20 mm-postdilated to 3.1-2.9 mm. No recurrent chest pain.  - Continue ASA, Brillinta, statin and BB  2. Sustained VT/ symptomatic PVCs - hx of VT/VF arrest with prior STEMI in 2014 - Patient had Sustained ventricular tachycardia related to angiography yesterday--s/p 1 shock DCCV restoring sinus rhythm - Brief tachycardia post PCI as well.  - Currently having symptomatic PVCS>> reports intermittent shortness of breath - Increase metoprolol to 150mg  BID (This is his home dose) and current K and Mg - ? EP evaluation   3. Hypokalemia/Hypomegnesimia - Supplement given  4. Cardiomyopathy -  Cath showed Mildly reduced EF of 45% with lateral hypokinesis. LVEDP 67mm HG - Pending echo - Given IV lasix 40mg  x 1 yesterday. No I & O recorded but lost 4 lb.  - No significant volume overload by exam - Increase metoprolol to home dose 150mg  BID as above (consolidate to long acting pending echo or change to Coreg) - Continue Losartan 100mg  qd - ? Add spironolactone / Entresto based on echo  5. HTN - BP soft - Hold Amlodipine - Continue Losartan 100mg  qd - Increase metoprolol to 150mg  BID  6. Tobacco abuse - on nicotine patch  7. HLD - No results found for requested labs within last 8760 hours.  - Recheck Lab in AM - Continue Lipitor 80mg  qd   For questions or updates, please contact [place] Please consult [email or web address] for contact info under        SignedLeanor Kail, PA  10/08/2019, 8:05 AM    I have personally seen and examined this patient. I agree with the assessment and plan as outlined above.  He is doing well this am but feeling the PVCs and feeling a little dizzy. No chest pain. Replace lytes and increase metoprolol back to home dose.  Ambulate.Echo today Continue ASA and Brilinta. Continue statin.   Lauree Chandler 10/08/2019 9:38 AM

## 2019-10-08 NOTE — Care Management (Signed)
10-08-19 1716 Case Manager spoke with patient regarding primary care provider. Patient is aware to call [employer] for in network provider. No further needs from Case Manager at this time. Graves-Bigelow, Ocie Cornfield, RN, BSN Case Manager

## 2019-10-09 DIAGNOSIS — I493 Ventricular premature depolarization: Secondary | ICD-10-CM

## 2019-10-09 LAB — CBC
HCT: 43 % (ref 39.0–52.0)
Hemoglobin: 14.7 g/dL (ref 13.0–17.0)
MCH: 34.9 pg — ABNORMAL HIGH (ref 26.0–34.0)
MCHC: 34.2 g/dL (ref 30.0–36.0)
MCV: 102.1 fL — ABNORMAL HIGH (ref 80.0–100.0)
Platelets: 210 10*3/uL (ref 150–400)
RBC: 4.21 MIL/uL — ABNORMAL LOW (ref 4.22–5.81)
RDW: 12.9 % (ref 11.5–15.5)
WBC: 9.8 10*3/uL (ref 4.0–10.5)
nRBC: 0 % (ref 0.0–0.2)

## 2019-10-09 LAB — BASIC METABOLIC PANEL
Anion gap: 11 (ref 5–15)
BUN: 9 mg/dL (ref 6–20)
CO2: 22 mmol/L (ref 22–32)
Calcium: 9.3 mg/dL (ref 8.9–10.3)
Chloride: 104 mmol/L (ref 98–111)
Creatinine, Ser: 0.8 mg/dL (ref 0.61–1.24)
GFR calc Af Amer: >60 mL/min (ref >60)
GFR calc non Af Amer: >60 mL/min (ref >60)
Glucose, Bld: 101 mg/dL — ABNORMAL HIGH (ref 70–99)
Potassium: 4.5 mmol/L (ref 3.5–5.1)
Sodium: 137 mmol/L (ref 135–145)

## 2019-10-09 LAB — LIPID PANEL
Cholesterol: 146 mg/dL (ref 0–200)
HDL: 39 mg/dL — ABNORMAL LOW (ref >40)
LDL Cholesterol: 74 mg/dL (ref 0–99)
Total CHOL/HDL Ratio: 3.7 RATIO
Triglycerides: 165 mg/dL — ABNORMAL HIGH (ref <150)
VLDL: 33 mg/dL (ref 0–40)

## 2019-10-09 LAB — MAGNESIUM: Magnesium: 2 mg/dL (ref 1.7–2.4)

## 2019-10-09 MED ORDER — TICAGRELOR 90 MG PO TABS: 90.0000 mg | ORAL_TABLET | Freq: Two times a day (BID) | ORAL | 11 refills | Status: DC

## 2019-10-09 MED ORDER — METOPROLOL TARTRATE 100 MG PO TABS: 100.0000 mg | ORAL_TABLET | Freq: Two times a day (BID) | ORAL | 2 refills | Status: DC

## 2019-10-09 MED ORDER — METOPROLOL TARTRATE 50 MG PO TABS: 50.0000 mg | ORAL_TABLET | Freq: Two times a day (BID) | ORAL | 2 refills | Status: DC

## 2019-10-09 MED FILL — BRILINTA 90 MG TABLET: 90 | 30 days supply | Qty: 60 | Fill #0

## 2019-10-09 NOTE — Discharge Instructions (Signed)
Post NSTEMI: NO HEAVY LIFTING X 2 WEEKS. NO SEXUAL ACTIVITY X 2 WEEKS. NO SOAKING BATHS, HOT TUBS, POOLS, ETC., X 7 DAYS.  Radial Site Care: Refer to this sheet in the next few weeks. These instructions provide you with information on caring for yourself after your procedure. Your caregiver may also give you more specific instructions. Your treatment has been planned according to current medical practices, but problems sometimes occur. Call your caregiver if you have any problems or questions after your procedure. HOME CARE INSTRUCTIONS  You may shower the day after the procedure.Remove the bandage (dressing) and gently wash the site with plain soap and water.Gently pat the site dry.   Do not apply powder or lotion to the site.   Do not submerge the affected site in water for 3 to 5 days.   Inspect the site at least twice daily.   Do not flex or bend the affected arm for 24 hours.   No lifting over 5 pounds (2.3 kg) for 5 days after your procedure.   Do not drive home if you are discharged the same day of the procedure. Have someone else drive you.  What to expect:  Any bruising will usually fade within 1 to 2 weeks.   Blood that collects in the tissue (hematoma) may be painful to the touch. It should usually decrease in size and tenderness within 1 to 2 weeks.  SEEK IMMEDIATE MEDICAL CARE IF:  You have unusual pain at the radial site.   You have redness, warmth, swelling, or pain at the radial site.   You have drainage (other than a small amount of blood on the dressing).   You have chills.   You have a fever or persistent symptoms for more than 72 hours.   You have a fever and your symptoms suddenly get worse.   Your arm becomes pale, cool, tingly, or numb.   You have heavy bleeding from the site. Hold pressure on the site.      Heart-Healthy Eating Plan Heart-healthy meal planning includes:  Eating less unhealthy fats.  Eating more healthy fats.  Making  other changes in your diet. Talk with your doctor or a diet specialist (dietitian) to create an eating plan that is right for you. What is my plan? Your doctor may recommend an eating plan that includes:  Total fat: ______% or less of total calories a day.  Saturated fat: ______% or less of total calories a day.  Cholesterol: less than _________mg a day. What are tips for following this plan? Cooking Avoid frying your food. Try to bake, boil, grill, or broil it instead. You can also reduce fat by:  Removing the skin from poultry.  Removing all visible fats from meats.  Steaming vegetables in water or broth. Meal planning   At meals, divide your plate into four equal parts: ? Fill one-half of your plate with vegetables and green salads. ? Fill one-fourth of your plate with whole grains. ? Fill one-fourth of your plate with lean protein foods.  Eat 4-5 servings of vegetables per day. A serving of vegetables is: ? 1 cup of raw or cooked vegetables. ? 2 cups of raw leafy greens.  Eat 4-5 servings of fruit per day. A serving of fruit is: ? 1 medium whole fruit. ?  cup of dried fruit. ?  cup of fresh, frozen, or canned fruit. ?  cup of 100% fruit juice.  Eat more foods that have soluble fiber. These are apples,  broccoli, carrots, beans, peas, and barley. Try to get 20-30 g of fiber per day.  Eat 4-5 servings of nuts, legumes, and seeds per week: ? 1 serving of dried beans or legumes equals  cup after being cooked. ? 1 serving of nuts is  cup. ? 1 serving of seeds equals 1 tablespoon. General information  Eat more home-cooked food. Eat less restaurant, buffet, and fast food.  Limit or avoid alcohol.  Limit foods that are high in starch and sugar.  Avoid fried foods.  Lose weight if you are overweight.  Keep track of how much salt (sodium) you eat. This is important if you have high blood pressure. Ask your doctor to tell you more about this.  Try to add  vegetarian meals each week. Fats  Choose healthy fats. These include olive oil and canola oil, flaxseeds, walnuts, almonds, and seeds.  Eat more omega-3 fats. These include salmon, mackerel, sardines, tuna, flaxseed oil, and ground flaxseeds. Try to eat fish at least 2 times each week.  Check food labels. Avoid foods with trans fats or high amounts of saturated fat.  Limit saturated fats. ? These are often found in animal products, such as meats, butter, and cream. ? These are also found in plant foods, such as palm oil, palm kernel oil, and coconut oil.  Avoid foods with partially hydrogenated oils in them. These have trans fats. Examples are stick margarine, some tub margarines, cookies, crackers, and other baked goods. What foods can I eat? Fruits All fresh, canned (in natural juice), or frozen fruits. Vegetables Fresh or frozen vegetables (raw, steamed, roasted, or grilled). Green salads. Grains Most grains. Choose whole wheat and whole grains most of the time. Rice and pasta, including brown rice and pastas made with whole wheat. Meats and other proteins Lean, well-trimmed beef, veal, pork, and lamb. Chicken and [place] without skin. All fish and shellfish. Wild duck, rabbit, pheasant, and venison. Egg whites or low-cholesterol egg substitutes. Dried beans, peas, lentils, and tofu. Seeds and most nuts. Dairy Low-fat or nonfat cheeses, including ricotta and mozzarella. Skim or 1% milk that is liquid, powdered, or evaporated. Buttermilk that is made with low-fat milk. Nonfat or low-fat yogurt. Fats and oils Non-hydrogenated (trans-free) margarines. Vegetable oils, including soybean, sesame, sunflower, olive, peanut, safflower, corn, canola, and cottonseed. Salad dressings or mayonnaise made with a vegetable oil. Beverages Mineral water. Coffee and tea. Diet carbonated beverages. Sweets and desserts Sherbet, gelatin, and fruit ice. Small amounts of dark chocolate. Limit all sweets and  desserts. Seasonings and condiments All seasonings and condiments. The items listed above may not be a complete list of foods and drinks you can eat. Contact a dietitian for more options. What foods should I avoid? Fruits Canned fruit in heavy syrup. Fruit in cream or butter sauce. Fried fruit. Limit coconut. Vegetables Vegetables cooked in cheese, cream, or butter sauce. Fried vegetables. Grains Breads that are made with saturated or trans fats, oils, or whole milk. Croissants. Sweet rolls. Donuts. High-fat crackers, such as cheese crackers. Meats and other proteins Fatty meats, such as hot dogs, ribs, sausage, bacon, rib-eye roast or steak. High-fat deli meats, such as salami and bologna. Caviar. Domestic duck and goose. Organ meats, such as liver. Dairy Cream, sour cream, cream cheese, and creamed cottage cheese. Whole-milk cheeses. Whole or 2% milk that is liquid, evaporated, or condensed. Whole buttermilk. Cream sauce or high-fat cheese sauce. Yogurt that is made from whole milk. Fats and oils Meat fat, or shortening. Cocoa butter,  hydrogenated oils, palm oil, coconut oil, palm kernel oil. Solid fats and shortenings, including bacon fat, salt pork, lard, and butter. Nondairy cream substitutes. Salad dressings with cheese or sour cream. Beverages Regular sodas and juice drinks with added sugar. Sweets and desserts Frosting. Pudding. Cookies. Cakes. Pies. Milk chocolate or white chocolate. Buttered syrups. Full-fat ice cream or ice cream drinks. The items listed above may not be a complete list of foods and drinks to avoid. Contact a dietitian for more information. Summary  Heart-healthy meal planning includes eating less unhealthy fats, eating more healthy fats, and making other changes in your diet.  Eat a balanced diet. This includes fruits and vegetables, low-fat or nonfat dairy, lean protein, nuts and legumes, whole grains, and heart-healthy oils and fats. This information is not  intended to replace advice given to you by your health care provider. Make sure you discuss any questions you have with your health care provider. Document Revised: 05/17/2017 Document Reviewed: 04/20/2017 Elsevier Patient Education  2020 [employer].

## 2019-10-09 NOTE — Discharge Summary (Signed)
Discharge Summary    Patient ID: Timothy Walker MRN: [id number]; DOB: 14-Jun-1965  Admit date: 10/07/2019 Discharge date: 10/09/2019  Primary Care Provider: Sinda Du, MD  Primary Cardiologist: Timothy Dolly, MD  Primary Electrophysiologist:  None   Discharge Diagnoses    Principal Problem:   NSTEMI (non-ST elevated myocardial infarction) [facility]) Active Problems:   Coronary artery disease involving native coronary artery of native heart with unstable angina pectoris [facility])   Ventricular tachycardia ([place])   Frequent PVCs   Essential hypertension   Hyperlipidemia with target LDL less than 70   Tobacco abuse    Diagnostic Studies/Procedures    Left Heart Catheterization 10/07/2019:  There is mild left ventricular systolic dysfunction. The left ventricular ejection fraction is 45-50% by visual LV end diastolic pressure is normal.  Coronary anatomy:  Prox RCA lesion is 30% stenosed. Previously placed Mid RCA to Dist RCA stent (unknown type) is widely patent.  Ramus lesion is 30% stenosed.  Prox Cx to Dist Cx lesion is 100% stenosed with 99% stenosed side branch in 1st Mrg. LPAV lesion is 80% stenosed.  A drug-eluting stent was successfully placed crossing from the small 1st Mrg into the LCx-2nd Mrg, using a SYNERGY XD 2.50X20.  Post intervention, there is a 0% residual stenosis.  Post intervention, the side branch was reduced to 99% residual stenosis.  A drug-eluting stent was successfully placed using a SYNERGY XD 2.50X20.  Summary:  Two-Vessel Disease: With widely patent distal RCA stent and Toprol lesion being 100% proximal to mid LCx occlusion proximal to very small caliber 1st Mrg & AVG LCx. ? Difficult lesion to cross, but successful PTCA and DES PCI of the acutely occluded LCx using Synergy DES 2.5 mm x 20 mm-postdilated to 3.1-2.9 mm  Mild proximal RCA and LAD disease.  Mildly reduced EF of 45% with lateral hypokinesis.  Sustained ventricular  tachycardia related to angiography--s/p 1 shock DCCV restoring sinus rhythm  Recommendations:  Restart postop medications but will reduce beta-blocker in half starting tonight. ->Will need to determine appropriateness of home doses, but will dose other medications as of tomorrow.  Continue home statin  Cardiac rehab recommendation/consultation team to see the patient from hospital.  Diagnostic Dominance: Right  Intervention   _______________  Echocardiogram 10/08/2019: Impressions: 1. Left ventricular ejection fraction, by estimation, is 40 to 45%. The  left ventricle has mildly decreased function. The left ventricle has no  regional wall motion abnormalities. There is moderate asymmetric left  ventricular hypertrophy of the  anteroseptum segment. Left ventricular diastolic parameters are consistent  with Grade I diastolic dysfunction (impaired relaxation). Diffuse mild  hypokinesis of the basal to mid segments, slightly worse in the  inferolateral myocardium. There is relative  sparing of the apical segments.  2. Right ventricular systolic function is normal. The right ventricular  size is normal.  3. Right atrial size was moderately dilated.  4. The mitral valve is normal in structure. Mild mitral valve  regurgitation. No evidence of mitral stenosis.  5. The aortic valve is tricuspid. Aortic valve regurgitation is not  visualized. No aortic stenosis is present.  6. Aortic dilatation noted. There is mild dilatation of the ascending  aorta measuring 37 mm.  7. The inferior vena cava is normal in size with greater than 50%  respiratory variability, suggesting right atrial pressure of 3 mmHg.    History of Present Illness     Timothy Walker is a 54 y.o. male with Mr. Timothy Walker is a 54 year old  with a history of CAD with STEMI in 12/2012 s/p DES to RCA, VT/VF arrest, chronic systolic CHF with EF of 33-00%, COPD, hypertension, hyperlipidemia, and GERD who was admitted  on 10/07/2019 with NSTEMI.   Patient presented with acute onset of 10/10 central stabbing chest pain that started on the night prior to presentation and persisted despite 2 Aspirin and 2 sublingual Nitro. Pain is slightly to the right of the middle of his chest and patient states he feels like it wraps around the bottom of his heart and goes down his left arm. Similar in character to prior episodes of anginal pain. He had associated mild nausea and diaphoresis with onset of chest pain which prompted him to go to [facility] for additional evaluation. EKG showed diffuse deep ST depression. He was given additional Aspirin and Nitro at [facility] ED given ongoing chest pain and was also started on IV Heparin.   He was transferred to [facility] for further management of NSTEMI.    Hospital Course     Consultants: None  NSTEMI  High-sensitivity troponin peaked at 1,924. Left heart catheterization showed with 100% stenosis of the proximal to distal CX with 99% stenosis of side branch in 1st marginal as well as 80% stenosis of LPAV. Prior stent to mid to distal RCA widely patent. Patient underwent difficult but successful PTCA with DES to acutely occluded LCX. During procedure, patient developed sustained VT requiring DCCV x1 with restoration of sinus rhythm. Echo showed LVEF of 40-45% with diffuse mild hypokinesis of the basal to mid segments slightly worse in the inferolateral myocardium as well as moderate asymmetric LVH of anteroseptum segment and grade 1 diastolic dysfunction. Right radial cath site soft with no signs of hematoma. He notes some chest discomfort on night prior to discharge that he thinks is indigestion but nothing concerning for angina. Also notes some transient shortness of breath that sounds consistent with Brilinta. Discussed using caffeine to help with this. Felt to be stable for discharge. Continue dual antiplatelet therapy with Aspirin 81mg  daily and Brilinta 90mg  twice daily.  Continue beta-blocker and high-intensity statin.   Sustained VT/ Symptomatic PVCs  History of VT/VF arrest with prior STEMI in 2014. Patient had sustained ventricular tachycardia related to angiography on 10/07/2019 requiring DCCV x1 with restoration of sinus rhythm. Also noted to have brief atrial tachycardia following PCI. On day following cath, patient continued to have symptomatic PVCs with intermittent shortness of breath and lightheadedness. Lopressor increased back to 150mg  twice daily (home dose) with improvement of PVCs. Will continue this dose at discharge.  Chronic Systolic CHF/ Ischemic Cardiomyopathy  Echo as above. LVEDP 16 mmHg on cath. He received 1 dose of IV Lasix 40mg  following cath given shortness of breath. Euvolemic on exam. Continue ARB and beta-blocker.  Hypertension  BP soft after cath so home Amlodipine stopped. BP improved with this. Continue Losartan 100mg  daily and Lopressor 150mg  twice daily. Continue to hold Amlodipine at discharge. May be able to be restarted at follow-up.  Hyperlipidemia  Lipid panel this admission: Total Cholesterol 146, Triglycerides 165, HDL 39, LDL 74. LDL goal <70 given CAD. Continue home Lipitor 80mg  daily. Consider adding Zetia as outpatient.  Hypokalemia/ Hypomagnesemia  Potassium 3.2 and Magnesium 1.6 on 10/08/2019. Both supplemented and and within normal limits on day of discharge.  Tobacco Abuse   Encouraged complete cessation. Patient did not want any Nicotine patch/gum at discharge.  Patient seen and examined by Dr. Angelena Form today and determined to be safe for  discharge. Outpatient follow-up has been arranged. Medications as below.    Did the patient have an acute coronary syndrome (MI, NSTEMI, STEMI, etc) this admission?:  Yes                               AHA/ACC Clinical Performance & Quality Measures: 5. Aspirin prescribed? - Yes 6. ADP Receptor Inhibitor (Plavix/Clopidogrel, Brilinta/Ticagrelor or Effient/Prasugrel)  prescribed (includes medically managed patients)? - Yes 7. Beta Blocker prescribed? - Yes 8. High Intensity Statin (Lipitor 40-80mg  or Crestor 20-40mg ) prescribed? - Yes 9. EF assessed during THIS hospitalization? - Yes 10. For EF <40%, was ACEI/ARB prescribed? - Not Applicable (EF >/= 64%) 11. For EF <40%, Aldosterone Antagonist (Spironolactone or Eplerenone) prescribed? - Not Applicable (EF >/= 33%) 12. Cardiac Rehab Phase II ordered (including medically managed patients)? - Yes   _____________  Discharge Vitals Blood pressure 129/85, pulse 79, temperature 98 F (36.7 C), temperature source Oral, resp. rate 16, height 5\' 11"  (1.803 m), weight 81.2 kg, SpO2 98 %.  Filed Weights   10/07/19 1452 10/08/19 0453 10/09/19 0505  Weight: 83.3 kg 81.5 kg 81.2 kg    Labs & Radiologic Studies    CBC Recent Labs    10/07/19 0420 10/07/19 0420 10/08/19 0506 10/09/19 0805  WBC 11.7*   < > 10.1 9.8  NEUTROABS 7.7  --   --   --   HGB 14.1   < > 14.0 14.7  HCT 41.3   < > 41.4 43.0  MCV 101.5*   < > 101.0* 102.1*  PLT 228   < > 186 210   < > = values in this interval not displayed.   Basic Metabolic Panel Recent Labs    10/07/19 0420 10/07/19 2051 10/08/19 0506 10/09/19 0805  NA   < >  --  137 137  K   < >  --  3.2* 4.5  CL   < >  --  99 104  CO2   < >  --  23 22  GLUCOSE   < >  --  93 101*  BUN   < >  --  8 9  CREATININE   < >  --  0.80 0.80  CALCIUM   < >  --  8.8* 9.3  MG  --  1.6*  --  2.0   < > = values in this interval not displayed.   Liver Function Tests No results for input(s): AST, ALT, ALKPHOS, BILITOT, PROT, ALBUMIN in the last 72 hours. No results for input(s): LIPASE, AMYLASE in the last 72 hours. High Sensitivity Troponin:   Recent Labs  Lab 10/07/19 0420 10/07/19 0641  TROPONINIHS 728* 1,924*    BNP Invalid input(s): POCBNP D-Dimer No results for input(s): DDIMER in the last 72 hours. Hemoglobin A1C No results for input(s): HGBA1C in the last 72  hours. Fasting Lipid Panel Recent Labs    10/09/19 0805  CHOL 146  HDL 39*  LDLCALC 74  TRIG 165*  CHOLHDL 3.7   Thyroid Function Tests No results for input(s): TSH, T4TOTAL, T3FREE, THYROIDAB in the last 72 hours.  Invalid input(s): FREET3 _____________  CARDIAC CATHETERIZATION  Result Date: 10/07/2019  There is mild left ventricular systolic dysfunction. The left ventricular ejection fraction is 45-50% by visual LV end diastolic pressure is normal.  Coronary anatomy:  Prox RCA lesion is 30% stenosed. Previously placed Mid RCA to Dist RCA stent (unknown type)  is widely patent.  Ramus lesion is 30% stenosed.  Prox Cx to Dist Cx lesion is 100% stenosed with 99% stenosed side branch in 1st Mrg. LPAV lesion is 80% stenosed.  A drug-eluting stent was successfully placed crossing from the small 1st Mrg into the LCx-2nd Mrg, using a SYNERGY XD 2.50X20.  Post intervention, there is a 0% residual stenosis.  Post intervention, the side branch was reduced to 99% residual stenosis.  A drug-eluting stent was successfully placed using a SYNERGY XD 2.50X20.  SUMMARY  Two-Vessel Disease: With widely patent distal RCA stent and Toprol lesion being 100% proximal to mid LCx occlusion proximal to very small caliber 1st Mrg & AVG LCx.  Difficult lesion to cross, but successful PTCA and DES PCI of the acutely occluded LCx using Synergy DES 2.5 mm x 20 mm-postdilated to 3.1-2.9 mm  Mild proximal RCA and LAD disease.  Mildly reduced EF of 45% with lateral hypokinesis.  Sustained ventricular tachycardia related to angiography--s/p 1 shock DCCV restoring sinus rhythm RECOMMENDATIONS  Restart postop medications but will reduce beta-blocker in half starting tonight. ->  Will need to determine appropriateness of home doses, but will dose other medications as of tomorrow.  Continue home statin  Cardiac rehab recommendation/consultation team to see the patient from hospital. Glenetta Hew, MD  DG Chest [facility] 1  View  Result Date: 10/07/2019 CLINICAL DATA:  54 year old male with chest pain. EXAM: PORTABLE CHEST 1 VIEW COMPARISON:  Chest radiograph dated 10/07/2019. FINDINGS: No focal consolidation, pleural effusion, or pneumothorax. Mild cardiomegaly similar to prior radiograph. There is bilateral hilar vascular prominence likely representing pulmonary hypertension. No acute osseous pathology. IMPRESSION: No acute cardiopulmonary process.  No interval change. Electronically Signed   By: Anner Crete M.D.   On: 10/07/2019 03:34   ECHOCARDIOGRAM COMPLETE  Result Date: 10/08/2019    ECHOCARDIOGRAM REPORT   Patient Name:   Timothy Walker Date of Exam: 10/08/2019 Medical Rec #:  [id number]      Height:       71.0 in Accession #:    [id number]     Weight:       179.7 lb Date of Birth:  18-Mar-1966      BSA:          2.015 m Patient Age:    40 years       BP:           143/109 mmHg Patient Gender: M              HR:           79 bpm. Exam Location:  Inpatient Procedure: 2D Echo, Cardiac Doppler and Color Doppler                             MODIFIED REPORT: This report was modified by Skeet Latch MD on 10/08/2019 due to LVEF.  Indications:     Acute Coronary Syndrome I24.9  History:         Patient has prior history of Echocardiogram examinations, most                  recent 09/26/2018. Cardiomyopathy, CAD and Previous Myocardial                  Infarction, COPD; Risk Factors:Hypertension, Sleep Apnea,                  Current Smoker and GERD.  Sonographer:  Tiffany Dance Referring Phys:  [id number] Charlie Pitter Diagnosing Phys: Skeet Latch MD IMPRESSIONS  1. Left ventricular ejection fraction, by estimation, is 40 to 45%. The left ventricle has mildly decreased function. The left ventricle has no regional wall motion abnormalities. There is moderate asymmetric left ventricular hypertrophy of the anteroseptum segment. Left ventricular diastolic parameters are consistent with Grade I diastolic dysfunction (impaired  relaxation). Diffuse mild hypokinesis of the basal to mid segments, slightly worse in the inferolateral myocardium. There is relative sparing of the apical segments.  2. Right ventricular systolic function is normal. The right ventricular size is normal.  3. Right atrial size was moderately dilated.  4. The mitral valve is normal in structure. Mild mitral valve regurgitation. No evidence of mitral stenosis.  5. The aortic valve is tricuspid. Aortic valve regurgitation is not visualized. No aortic stenosis is present.  6. Aortic dilatation noted. There is mild dilatation of the ascending aorta measuring 37 mm.  7. The inferior vena cava is normal in size with greater than 50% respiratory variability, suggesting right atrial pressure of 3 mmHg. FINDINGS  Left Ventricle: Left ventricular ejection fraction, by estimation, is 40 to 45%. The left ventricle has mildly decreased function. The left ventricle has no regional wall motion abnormalities. The left ventricular internal cavity size was normal in size. There is moderate asymmetric left ventricular hypertrophy of the anteroseptum segment. Left ventricular diastolic parameters are consistent with Grade I diastolic dysfunction (impaired relaxation). Indeterminate filling pressures.  LV Wall Scoring: The anterior wall, entire lateral wall, anterior septum, inferior wall, mid inferoseptal segment, and basal inferoseptal segment are hypokinetic. The apical septal segment, apical anterior segment, apical inferior segment, and apex are normal. Diffuse mild hypokinesis of the basal to mid segments, slightly worse in the inferolateral myocardium. There is relative sparing of the apical segments. Right Ventricle: The right ventricular size is normal. No increase in right ventricular wall thickness. Right ventricular systolic function is normal. Left Atrium: Left atrial size was normal in size. Right Atrium: Right atrial size was moderately dilated. Pericardium: There is no  evidence of pericardial effusion. Mitral Valve: The mitral valve is normal in structure. Normal mobility of the mitral valve leaflets. Mild mitral valve regurgitation. No evidence of mitral valve stenosis. Tricuspid Valve: The tricuspid valve is normal in structure. Tricuspid valve regurgitation is not demonstrated. No evidence of tricuspid stenosis. Aortic Valve: The aortic valve is tricuspid. Aortic valve regurgitation is not visualized. No aortic stenosis is present. Pulmonic Valve: The pulmonic valve was normal in structure. Pulmonic valve regurgitation is trivial. No evidence of pulmonic stenosis. Aorta: Aortic dilatation noted. There is mild dilatation of the ascending aorta measuring 37 mm. Venous: The inferior vena cava is normal in size with greater than 50% respiratory variability, suggesting right atrial pressure of 3 mmHg. IAS/Shunts: No atrial level shunt detected by color flow Doppler.  LEFT VENTRICLE PLAX 2D LVIDd:         5.94 cm  Diastology LVIDs:         4.81 cm  LV e' lateral:   5.11 cm/s LV PW:         0.94 cm  LV E/e' lateral: 9.7 LV IVS:        1.42 cm  LV e' medial:    4.57 cm/s LVOT diam:     2.10 cm  LV E/e' medial:  10.8 LV SV:         50 LV SV Index:   25 LVOT Area:  3.46 cm  RIGHT VENTRICLE            IVC RV Basal diam:  2.83 cm    IVC diam: 2.28 cm RV S prime:     9.57 cm/s TAPSE (M-mode): 2.0 cm LEFT ATRIUM             Index       RIGHT ATRIUM           Index LA diam:        3.90 cm 1.94 cm/m  RA Area:     23.30 cm LA Vol (A2C):   91.8 ml 45.57 ml/m RA Volume:   74.00 ml  36.73 ml/m LA Vol (A4C):   46.7 ml 23.18 ml/m LA Biplane Vol: 69.9 ml 34.70 ml/m  AORTIC VALVE LVOT Vmax:   82.35 cm/s LVOT Vmean:  55.400 cm/s LVOT VTI:    0.144 m  AORTA Ao Root diam: 3.80 cm Ao Asc diam:  3.70 cm MITRAL VALVE MV Area (PHT): 3.91 cm      SHUNTS MV Decel Time: 194 msec      Systemic VTI:  0.14 m MR Peak grad:    115.1 mmHg  Systemic Diam: 2.10 cm MR Mean grad:    70.0 mmHg MR Vmax:          536.50 cm/s MR Vmean:        390.0 cm/s MR PISA:         0.25 cm MR PISA Eff ROA: 2 mm MR PISA Radius:  0.20 cm MV E velocity: 49.50 cm/s MV A velocity: 80.50 cm/s MV E/A ratio:  0.61 Skeet Latch MD Electronically signed by Skeet Latch MD Signature Date/Time: 10/08/2019/1:39:54 PM    Final (Updated)    Disposition   Patient is being discharged home today in good condition.  Follow-up Plans & Appointments     Follow-up Information    Verta Ellen., NP. Go to.   Specialty: Cardiology Why: Hospital follow-up scheduled for 10/21/2019 at 9:30am with Katina Dung, one of Dr. Nelly Laurence NPs. If this date/time does not work for you, please call our office to reschedule.  Contact information: [place] [address] [phone]              Discharge Instructions    Amb Referral to Cardiac Rehabilitation   Complete by: As directed    Diagnosis:  Coronary Stents PTCA NSTEMI     After initial evaluation and assessments completed: Virtual Based Care may be provided alone or in conjunction with Phase 2 Cardiac Rehab based on patient barriers.: Yes   Diet - low sodium heart healthy   Complete by: As directed    Increase activity slowly   Complete by: As directed       Discharge Medications   Allergies as of 10/09/2019   No Known Allergies     Medication List    STOP taking these medications   amLODipine 10 MG tablet Commonly known as: NORVASC     TAKE these medications   albuterol 108 (90 Base) MCG/ACT inhaler Commonly known as: VENTOLIN HFA Inhale 1-2 puffs into the lungs every 6 (six) hours as needed for wheezing or shortness of breath.   aspirin 81 MG tablet Take 81 mg by mouth daily.   atorvastatin 80 MG tablet Commonly known as: LIPITOR TAKE 1 TABLET (80 MG TOTAL) BY MOUTH DAILY AT 6 PM.   losartan 100 MG tablet Commonly known as: COZAAR Take 1  tablet (100 mg total) by mouth daily.   metoprolol tartrate 100 MG  tablet Commonly known as: LOPRESSOR Take 1 tablet (100 mg total) by mouth 2 (two) times daily. Take 1 tablet (100mg ) twice daily in addition to 50mg  tablet for total of 150mg  twice daily. What changed: additional instructions   metoprolol tartrate 50 MG tablet Commonly known as: LOPRESSOR Take 1 tablet (50 mg total) by mouth 2 (two) times daily. Take 1 tablet (50mg )twice daily in addition to 100mg  tablet for total of 150mg  twice daily. What changed: additional instructions   nitroGLYCERIN 0.4 MG SL tablet Commonly known as: Nitrostat Place 1 tablet (0.4 mg total) under the tongue every 5 (five) minutes as needed for chest pain.   pantoprazole 40 MG tablet Commonly known as: PROTONIX TAKE 1 TABLET BY MOUTH TWICE A DAY   sildenafil 100 MG tablet Commonly known as: Viagra Take 1 tablet (100 mg total) by mouth daily as needed for erectile dysfunction.   Spiriva HandiHaler 18 MCG inhalation capsule Generic drug: tiotropium Place 1 capsule (18 mcg total) into inhaler and inhale daily. What changed: when to take this   ticagrelor 90 MG Tabs tablet Commonly known as: BRILINTA Take 1 tablet (90 mg total) by mouth 2 (two) times daily.          Outstanding Labs/Studies   N/A.  Duration of Discharge Encounter   Greater than 30 minutes including physician time.  Signed, Darreld Mclean, PA-C 10/09/2019, 10:16 AM

## 2019-10-09 NOTE — Plan of Care (Signed)
Educated patient on compliance with medication regimen. Not lifting weight >5 lb and driving restriction for a limited time.

## 2019-10-09 NOTE — Progress Notes (Signed)
CARDIAC REHAB PHASE I   PRE:  Rate/Rhythm: 71 SR    BP: sitting     SaO2:   MODE:  Ambulation: 470 ft   POST:  Rate/Rhythm: 84 SR    BP: sitting 141/107     SaO2:   Pt up walking hall slowly. No PVCs noted, no palpitations. Pt does c/o SOB with walking and CP after walking.  He wonders if it is indigestion. Reinforced risk factors and med importance. Voiced understanding. Tobias, ACSM 10/09/2019 9:39 AM

## 2019-10-09 NOTE — Progress Notes (Addendum)
Progress Note  Patient Name: Timothy Walker Date of Encounter: 10/09/2019  [facility] Cardiologist: Carlyle Dolly, MD   Subjective   Patient mild substernal non-radiating chest pain overnight that he states felt like indigestion. No chest pain now. Also reports continued transient shortness of breath which sounds like it is from the [place]. He has not been up to ambulate today so unclear but no lightheadedness at rest.  Inpatient Medications    Scheduled Meds: . aspirin EC  81 mg Oral Daily  . atorvastatin  80 mg Oral q1800  . losartan  100 mg Oral Daily  . metoprolol tartrate  150 mg Oral BID  . nicotine  14 mg Transdermal Daily  . pantoprazole  40 mg Oral BID  . QUEtiapine  25 mg Oral QHS  . sodium chloride flush  3 mL Intravenous Q12H  . ticagrelor  90 mg Oral BID  . umeclidinium bromide  1 puff Inhalation Daily   Continuous Infusions: . sodium chloride     PRN Meds: sodium chloride, acetaminophen, ondansetron (ZOFRAN) IV, sodium chloride flush   Vital Signs    Vitals:   10/08/19 1700 10/08/19 2121 10/08/19 2216 10/09/19 0505  BP: 136/86 117/83 130/89 129/85  Pulse:  75  79  Resp:  18  16  Temp: 97.9 F (36.6 C) 98 F (36.7 C)  98 F (36.7 C)  TempSrc: Oral Oral  Oral  SpO2:  98%  98%  Weight:    81.2 kg  Height:        Intake/Output Summary (Last 24 hours) at 10/09/2019 0847 Last data filed at 10/08/2019 2200 Gross per 24 hour  Intake 240 ml  Output --  Net 240 ml   Last 3 Weights 10/09/2019 10/08/2019 10/07/2019  Weight (lbs) 179 lb 0.2 oz 179 lb 10.8 oz 183 lb 9.6 oz  Weight (kg) 81.2 kg 81.5 kg 83.28 kg      Telemetry    Normal sinus rhythm with rates in the 60's to 80's. PVCs improved. - Personally Reviewed  ECG    No new ECG tracings today. - Personally Reviewed  Physical Exam   GEN: No acute distress.   Neck: Supple. Cardiac: RRR. No murmurs, rubs, or gallops. Right radial cath site soft with no signs of hematoma. Respiratory:  Clear to auscultation bilaterally. GI: Soft, non-tender, non-distended  MS: No lower extremity edema. No deformity. Neuro:  No focal deficits. Psych: Normal affect. Responds appropriately.  Labs    High Sensitivity Troponin:   Recent Labs  Lab 10/07/19 0420 10/07/19 0641  TROPONINIHS 728* 1,924*      Chemistry Recent Labs  Lab 10/07/19 0420 10/08/19 0506  NA 139 137  K 4.1 3.2*  CL 104 99  CO2 23 23  GLUCOSE 111* 93  BUN 10 8  CREATININE 0.95 0.80  CALCIUM 9.0 8.8*  GFRNONAA >60 >60  GFRAA >60 >60  ANIONGAP 12 15     Hematology Recent Labs  Lab 10/07/19 0420 10/08/19 0506 10/09/19 0805  WBC 11.7* 10.1 9.8  RBC 4.07* 4.10* 4.21*  HGB 14.1 14.0 14.7  HCT 41.3 41.4 43.0  MCV 101.5* 101.0* 102.1*  MCH 34.6* 34.1* 34.9*  MCHC 34.1 33.8 34.2  RDW 12.7 12.9 12.9  PLT 228 186 210    BNPNo results for input(s): BNP, PROBNP in the last 168 hours.   DDimer No results for input(s): DDIMER in the last 168 hours.   Radiology    CARDIAC CATHETERIZATION  Result Date: 10/07/2019  There is mild left ventricular systolic dysfunction. The left ventricular ejection fraction is 45-50% by visual LV end diastolic pressure is normal.  Coronary anatomy:  Prox RCA lesion is 30% stenosed. Previously placed Mid RCA to Dist RCA stent (unknown type) is widely patent.  Ramus lesion is 30% stenosed.  Prox Cx to Dist Cx lesion is 100% stenosed with 99% stenosed side branch in 1st Mrg. LPAV lesion is 80% stenosed.  A drug-eluting stent was successfully placed crossing from the small 1st Mrg into the LCx-2nd Mrg, using a SYNERGY XD 2.50X20.  Post intervention, there is a 0% residual stenosis.  Post intervention, the side branch was reduced to 99% residual stenosis.  A drug-eluting stent was successfully placed using a SYNERGY XD 2.50X20.  SUMMARY  Two-Vessel Disease: With widely patent distal RCA stent and Toprol lesion being 100% proximal to mid LCx occlusion proximal to very small  caliber 1st Mrg & AVG LCx.  Difficult lesion to cross, but successful PTCA and DES PCI of the acutely occluded LCx using Synergy DES 2.5 mm x 20 mm-postdilated to 3.1-2.9 mm  Mild proximal RCA and LAD disease.  Mildly reduced EF of 45% with lateral hypokinesis.  Sustained ventricular tachycardia related to angiography--s/p 1 shock DCCV restoring sinus rhythm RECOMMENDATIONS  Restart postop medications but will reduce beta-blocker in half starting tonight. ->  Will need to determine appropriateness of home doses, but will dose other medications as of tomorrow.  Continue home statin  Cardiac rehab recommendation/consultation team to see the patient from hospital. Glenetta Hew, MD  ECHOCARDIOGRAM COMPLETE  Result Date: 10/08/2019    ECHOCARDIOGRAM REPORT   Patient Name:   Timothy Walker Date of Exam: 10/08/2019 Medical Rec #:  [id number]      Height:       71.0 in Accession #:    [id number]     Weight:       179.7 lb Date of Birth:  03-Mar-1966      BSA:          2.015 m Patient Age:    54 years       BP:           143/109 mmHg Patient Gender: M              HR:           79 bpm. Exam Location:  Inpatient Procedure: 2D Echo, Cardiac Doppler and Color Doppler                             MODIFIED REPORT: This report was modified by Skeet Latch MD on 10/08/2019 due to LVEF.  Indications:     Acute Coronary Syndrome I24.9  History:         Patient has prior history of Echocardiogram examinations, most                  recent 09/26/2018. Cardiomyopathy, CAD and Previous Myocardial                  Infarction, COPD; Risk Factors:Hypertension, Sleep Apnea,                  Current Smoker and GERD.  Sonographer:     Jonelle Sidle Dance Referring Phys:  [id number] CZYSA Y TKZS Diagnosing Phys: Skeet Latch MD IMPRESSIONS  1. Left ventricular ejection fraction, by estimation, is 40 to 45%. The left ventricle has mildly decreased function. The left ventricle  has no regional wall motion abnormalities. There is moderate  asymmetric left ventricular hypertrophy of the anteroseptum segment. Left ventricular diastolic parameters are consistent with Grade I diastolic dysfunction (impaired relaxation). Diffuse mild hypokinesis of the basal to mid segments, slightly worse in the inferolateral myocardium. There is relative sparing of the apical segments.  2. Right ventricular systolic function is normal. The right ventricular size is normal.  3. Right atrial size was moderately dilated.  4. The mitral valve is normal in structure. Mild mitral valve regurgitation. No evidence of mitral stenosis.  5. The aortic valve is tricuspid. Aortic valve regurgitation is not visualized. No aortic stenosis is present.  6. Aortic dilatation noted. There is mild dilatation of the ascending aorta measuring 37 mm.  7. The inferior vena cava is normal in size with greater than 50% respiratory variability, suggesting right atrial pressure of 3 mmHg. FINDINGS  Left Ventricle: Left ventricular ejection fraction, by estimation, is 40 to 45%. The left ventricle has mildly decreased function. The left ventricle has no regional wall motion abnormalities. The left ventricular internal cavity size was normal in size. There is moderate asymmetric left ventricular hypertrophy of the anteroseptum segment. Left ventricular diastolic parameters are consistent with Grade I diastolic dysfunction (impaired relaxation). Indeterminate filling pressures.  LV Wall Scoring: The anterior wall, entire lateral wall, anterior septum, inferior wall, mid inferoseptal segment, and basal inferoseptal segment are hypokinetic. The apical septal segment, apical anterior segment, apical inferior segment, and apex are normal. Diffuse mild hypokinesis of the basal to mid segments, slightly worse in the inferolateral myocardium. There is relative sparing of the apical segments. Right Ventricle: The right ventricular size is normal. No increase in right ventricular wall thickness. Right  ventricular systolic function is normal. Left Atrium: Left atrial size was normal in size. Right Atrium: Right atrial size was moderately dilated. Pericardium: There is no evidence of pericardial effusion. Mitral Valve: The mitral valve is normal in structure. Normal mobility of the mitral valve leaflets. Mild mitral valve regurgitation. No evidence of mitral valve stenosis. Tricuspid Valve: The tricuspid valve is normal in structure. Tricuspid valve regurgitation is not demonstrated. No evidence of tricuspid stenosis. Aortic Valve: The aortic valve is tricuspid. Aortic valve regurgitation is not visualized. No aortic stenosis is present. Pulmonic Valve: The pulmonic valve was normal in structure. Pulmonic valve regurgitation is trivial. No evidence of pulmonic stenosis. Aorta: Aortic dilatation noted. There is mild dilatation of the ascending aorta measuring 37 mm. Venous: The inferior vena cava is normal in size with greater than 50% respiratory variability, suggesting right atrial pressure of 3 mmHg. IAS/Shunts: No atrial level shunt detected by color flow Doppler.  LEFT VENTRICLE PLAX 2D LVIDd:         5.94 cm  Diastology LVIDs:         4.81 cm  LV e' lateral:   5.11 cm/s LV PW:         0.94 cm  LV E/e' lateral: 9.7 LV IVS:        1.42 cm  LV e' medial:    4.57 cm/s LVOT diam:     2.10 cm  LV E/e' medial:  10.8 LV SV:         50 LV SV Index:   25 LVOT Area:     3.46 cm  RIGHT VENTRICLE            IVC RV Basal diam:  2.83 cm    IVC diam: 2.28 cm RV S prime:  9.57 cm/s TAPSE (M-mode): 2.0 cm LEFT ATRIUM             Index       RIGHT ATRIUM           Index LA diam:        3.90 cm 1.94 cm/m  RA Area:     23.30 cm LA Vol (A2C):   91.8 ml 45.57 ml/m RA Volume:   74.00 ml  36.73 ml/m LA Vol (A4C):   46.7 ml 23.18 ml/m LA Biplane Vol: 69.9 ml 34.70 ml/m  AORTIC VALVE LVOT Vmax:   82.35 cm/s LVOT Vmean:  55.400 cm/s LVOT VTI:    0.144 m  AORTA Ao Root diam: 3.80 cm Ao Asc diam:  3.70 cm MITRAL VALVE MV Area  (PHT): 3.91 cm      SHUNTS MV Decel Time: 194 msec      Systemic VTI:  0.14 m MR Peak grad:    115.1 mmHg  Systemic Diam: 2.10 cm MR Mean grad:    70.0 mmHg MR Vmax:         536.50 cm/s MR Vmean:        390.0 cm/s MR PISA:         0.25 cm MR PISA Eff ROA: 2 mm MR PISA Radius:  0.20 cm MV E velocity: 49.50 cm/s MV A velocity: 80.50 cm/s MV E/A ratio:  0.61 Skeet Latch MD Electronically signed by Skeet Latch MD Signature Date/Time: 10/08/2019/1:39:54 PM    Final (Updated)     Cardiac Studies   Left Heart Catheterization 10/07/2019:  There is mild left ventricular systolic dysfunction. The left ventricular ejection fraction is 45-50% by visual LV end diastolic pressure is normal.  Coronary anatomy:  Prox RCA lesion is 30% stenosed. Previously placed Mid RCA to Dist RCA stent (unknown type) is widely patent.  Ramus lesion is 30% stenosed.  Prox Cx to Dist Cx lesion is 100% stenosed with 99% stenosed side branch in 1st Mrg. LPAV lesion is 80% stenosed.  A drug-eluting stent was successfully placed crossing from the small 1st Mrg into the LCx-2nd Mrg, using a SYNERGY XD 2.50X20.  Post intervention, there is a 0% residual stenosis.  Post intervention, the side branch was reduced to 99% residual stenosis.  A drug-eluting stent was successfully placed using a SYNERGY XD 2.50X20.   Summary:  Two-Vessel Disease: With widely patent distal RCA stent and Toprol lesion being 100% proximal to mid LCx occlusion proximal to very small caliber 1st Mrg & AVG LCx. ? Difficult lesion to cross, but successful PTCA and DES PCI of the acutely occluded LCx using Synergy DES 2.5 mm x 20 mm-postdilated to 3.1-2.9 mm  Mild proximal RCA and LAD disease.  Mildly reduced EF of 45% with lateral hypokinesis.  Sustained ventricular tachycardia related to angiography--s/p 1 shock DCCV restoring sinus rhythm  Recommendations:  Restart postop medications but will reduce beta-blocker in half starting  tonight. ->  Will need to determine appropriateness of home doses, but will dose other medications as of tomorrow.  Continue home statin  Cardiac rehab recommendation/consultation team to see the patient from hospital.   Patient Profile     54 y.o. male CAD with a history of STEMI in 12/2012 s/p DES to RCA, VT/VF arrest, chronic systolic CHF with EF of 16-10%, COPD, hypertension, hyperlipidemia, and GERD who was admitted on 10/07/2019 with NSTEMI.    Assessment & Plan    NSTEMI  High-sensitivity troponin peaked at 1,924.  -  Left heart catheterization showed with 100% stenosis of the proximal to distal CX with 99% stenosis of side branch in 1st marginal as well as 80% stenosis of LPAV. Prior stent to mid to distal RCA widely patent. Patient underwent difficult but successful PTCA with DES to acutely occluded LCX. - Echo showed LVEF of 40-45% with  Diffuse mild hypokinesis of the basal to mid segments slightly worse in the inferolateral myocardium as well as moderate asymmetric LVH of anteroseptum segment and grade 1 diastolic dysfunction - Continue dual antiplatelet therapy with Aspirin 81mg  daily and Brilinta 90mg  twice daily.  - Continue beta-blocker and high-intensity statin.   Sustained VT/ Symptomatic PVCs  - History of VT/VF arrest with prior STEMI in 2014.  - Patient had sustained ventricular tachycardia related to angiography on 10/07/2019 requiring DCCV x1 with restoration of sinus rhythm. Also noted to have brief atrial tachycardia following PCI. On day following cath, patient continued to have symptomatic PVCs with intermittent shortness of breath  - Lopressor increased to 150mg  twice daily yesterday (home dose) with improvement in PVCs.  Chronic Systolic CHF/ Ischemic Cardiomyopathy  - Echo as above. LVEDP 16 mmHg on cath. He received 1 dose of IV Lasix 40mg  following cath given shortness of breath.  - Appears euvolemic.  - Continue ARB and beta-blocker. Will discuss with MD  about transitioning to Toprol or changing to Coreg given mildly reduced EF.  - Could also consider adding Spironolactone.  Hypertension  - BP relatively well controlled. - Continue current medications for now.  Hyperlipidemia  - Lipid panel pending. - Continue Lipitor 80mg  daily.  Hypokalemia/ Hypomagnesemia  - Potassium 3.2 and Magnesium 1.6 yesterday. Supplemented.  - Morning labs pending.  Tobacco Abuse  - On Nicotine patch.   For questions or updates, please contact [place] Please consult [email or web address] for contact info under        Signed, Darreld Mclean, PA-C  10/09/2019, 8:47 AM    I have personally seen and examined this patient. I agree with the assessment and plan as outlined above.  No chest pain this am. Some dyspnea. May be due to [place].  Will continue ASA, Brilinta, statin and beta blocker/ARB PVCs better on higher dose of metoprolol Will plan to d/c home today.  Follow up Dr. Orson Gear [facility] 10/09/2019 9:06 AM
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
## 2019-10-21 ENCOUNTER — Encounter: Payer: Self-pay | Admitting: *Deleted

## 2019-10-21 ENCOUNTER — Encounter: Payer: Self-pay | Admitting: Family Medicine

## 2019-10-21 ENCOUNTER — Ambulatory Visit: Payer: BC Managed Care – PPO | Admitting: Family Medicine

## 2019-10-21 VITALS — BP 156/107 | HR 74 | Ht 71.0 in | Wt 183.0 lb

## 2019-10-21 DIAGNOSIS — I252 Old myocardial infarction: Secondary | ICD-10-CM | POA: Diagnosis not present

## 2019-10-21 DIAGNOSIS — I10 Essential (primary) hypertension: Secondary | ICD-10-CM | POA: Diagnosis not present

## 2019-10-21 DIAGNOSIS — I255 Ischemic cardiomyopathy: Secondary | ICD-10-CM

## 2019-10-21 DIAGNOSIS — E785 Hyperlipidemia, unspecified: Secondary | ICD-10-CM

## 2019-10-21 DIAGNOSIS — I25110 Atherosclerotic heart disease of native coronary artery with unstable angina pectoris: Secondary | ICD-10-CM | POA: Diagnosis not present

## 2019-10-21 DIAGNOSIS — R002 Palpitations: Secondary | ICD-10-CM | POA: Diagnosis not present

## 2019-10-21 MED ORDER — AMLODIPINE BESYLATE 5 MG PO TABS
5.0000 mg | ORAL_TABLET | Freq: Every day | ORAL | 1 refills | Status: DC
Start: 2019-10-21 — End: 2020-10-28

## 2019-10-21 MED ORDER — DILTIAZEM HCL 30 MG PO TABS
30.0000 mg | ORAL_TABLET | Freq: Every day | ORAL | 1 refills | Status: DC | PRN
Start: 2019-10-21 — End: 2019-11-12

## 2019-10-21 NOTE — Patient Instructions (Addendum)
Medication Instructions:   Your physician has recommended you make the following change in your medication:   Restart amlodipine 5 mg by mouth daily   Start diltiazem 30 mg daily as needed at bedtime for palpitations  Continue other medications the same  Labwork:  NONE  Testing/Procedures:  NONE  Follow-Up:  Your physician recommends that you schedule a follow-up appointment in: 1 month  Any Other Special Instructions Will Be Listed Below (If Applicable).  If you need a refill on your cardiac medications before your next appointment, please call your pharmacy.

## 2019-10-27 ENCOUNTER — Telehealth: Payer: Self-pay | Admitting: Family Medicine

## 2019-10-27 NOTE — Telephone Encounter (Signed)
Patient called stating that he needs to see if he can get another note for work to be out. States that he is trying to find a PCP but until he does he states that he is not able to go back to work.  [phone] leave message if he does not answer.

## 2019-10-27 NOTE — Telephone Encounter (Signed)
Patient informed and verbalized understanding of plan. 

## 2019-10-27 NOTE — Telephone Encounter (Signed)
Tomorrow will be 3 weeks since his cardiac catheterization. He can go back to work.

## 2019-10-28 ENCOUNTER — Ambulatory Visit (INDEPENDENT_AMBULATORY_CARE_PROVIDER_SITE_OTHER): Payer: BC Managed Care – PPO

## 2019-10-28 ENCOUNTER — Telehealth: Payer: Self-pay | Admitting: Orthopedic Surgery

## 2019-10-28 ENCOUNTER — Ambulatory Visit
Admission: EM | Admit: 2019-10-28 | Discharge: 2019-10-28 | Disposition: A | Discharge status: Home / Self Care | Payer: BC Managed Care – PPO | Attending: Emergency Medicine | Admitting: Emergency Medicine

## 2019-10-28 DIAGNOSIS — M25552 Pain in left hip: Secondary | ICD-10-CM | POA: Diagnosis not present

## 2019-10-28 DIAGNOSIS — R102 Pelvic and perineal pain: Secondary | ICD-10-CM

## 2019-10-28 MED ORDER — PREDNISONE 20 MG PO TABS
20.0000 mg | ORAL_TABLET | Freq: Two times a day (BID) | ORAL | 0 refills | Status: AC
Start: 2019-10-28 — End: 2019-11-02

## 2019-10-28 MED ORDER — CYCLOBENZAPRINE HCL 10 MG PO TABS
10.0000 mg | ORAL_TABLET | Freq: Every day | ORAL | 0 refills | Status: DC
Start: 2019-10-28 — End: 2021-03-10

## 2019-10-28 NOTE — Telephone Encounter (Signed)
Called patient per voice message regarding having been seen at [facility]; returned call - patient relays he is already scheduled with orthopaedist in [place]; thanked [place] for calling him back.
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
## 2019-10-31 ENCOUNTER — Telehealth: Payer: Self-pay | Admitting: Cardiology

## 2019-10-31 NOTE — Telephone Encounter (Signed)
Will fwd to provider for clarification.

## 2019-10-31 NOTE — Telephone Encounter (Signed)
Ok for MRI  Timothy Abts MD
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
## 2019-10-31 NOTE — Telephone Encounter (Signed)
[place] notified

## 2019-11-01 ENCOUNTER — Other Ambulatory Visit: Payer: Self-pay | Admitting: Cardiology

## 2019-11-03 ENCOUNTER — Other Ambulatory Visit: Payer: Self-pay | Admitting: Cardiology

## 2019-11-12 ENCOUNTER — Other Ambulatory Visit: Payer: Self-pay | Admitting: Family Medicine

## 2019-11-21 ENCOUNTER — Other Ambulatory Visit: Payer: Self-pay

## 2019-11-21 ENCOUNTER — Ambulatory Visit: Payer: BC Managed Care – PPO | Admitting: Family Medicine

## 2019-11-21 ENCOUNTER — Encounter: Payer: Self-pay | Admitting: Family Medicine

## 2019-11-21 VITALS — BP 152/100 | HR 72 | Ht 71.0 in | Wt 189.6 lb

## 2019-11-21 DIAGNOSIS — I255 Ischemic cardiomyopathy: Secondary | ICD-10-CM

## 2019-11-21 DIAGNOSIS — I214 Non-ST elevation (NSTEMI) myocardial infarction: Secondary | ICD-10-CM

## 2019-11-21 DIAGNOSIS — R002 Palpitations: Secondary | ICD-10-CM

## 2019-11-21 DIAGNOSIS — E785 Hyperlipidemia, unspecified: Secondary | ICD-10-CM

## 2019-11-21 DIAGNOSIS — I2510 Atherosclerotic heart disease of native coronary artery without angina pectoris: Secondary | ICD-10-CM

## 2019-11-21 DIAGNOSIS — I10 Essential (primary) hypertension: Secondary | ICD-10-CM | POA: Diagnosis not present

## 2019-11-21 NOTE — Patient Instructions (Addendum)
Medication Instructions:   Resume the Amlodipine 5mg  daily.   Continue all other medications.    Labwork: none  Testing/Procedures: none  Follow-Up: 6 months   Any Other Special Instructions Will Be Listed Below (If Applicable).  If you need a refill on your cardiac medications before your next appointment, please call your pharmacy.
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
## 2020-01-22 ENCOUNTER — Telehealth: Payer: Self-pay | Admitting: Family Medicine

## 2020-01-22 NOTE — Telephone Encounter (Signed)
[employer] forms received ON 01/21/2020 via fax from fax # [phone]

## 2020-02-03 ENCOUNTER — Other Ambulatory Visit: Payer: Self-pay | Admitting: Cardiology

## 2020-04-04 ENCOUNTER — Other Ambulatory Visit: Payer: Self-pay | Admitting: Student

## 2020-05-31 ENCOUNTER — Ambulatory Visit: Payer: BC Managed Care – PPO | Admitting: Cardiology

## 2020-10-20 ENCOUNTER — Emergency Department (HOSPITAL_COMMUNITY)
Admission: EM | Admit: 2020-10-20 | Discharge: 2020-10-20 | Disposition: A | Discharge status: Home / Self Care | Payer: BC Managed Care – PPO | Attending: Emergency Medicine | Admitting: Emergency Medicine

## 2020-10-20 ENCOUNTER — Emergency Department (HOSPITAL_COMMUNITY): Payer: BC Managed Care – PPO

## 2020-10-20 ENCOUNTER — Other Ambulatory Visit: Payer: Self-pay

## 2020-10-20 ENCOUNTER — Encounter (HOSPITAL_COMMUNITY): Payer: Self-pay | Admitting: *Deleted

## 2020-10-20 DIAGNOSIS — I10 Essential (primary) hypertension: Secondary | ICD-10-CM | POA: Diagnosis not present

## 2020-10-20 DIAGNOSIS — I25118 Atherosclerotic heart disease of native coronary artery with other forms of angina pectoris: Secondary | ICD-10-CM | POA: Diagnosis not present

## 2020-10-20 DIAGNOSIS — R0789 Other chest pain: Secondary | ICD-10-CM | POA: Insufficient documentation

## 2020-10-20 DIAGNOSIS — E876 Hypokalemia: Secondary | ICD-10-CM

## 2020-10-20 DIAGNOSIS — Z79899 Other long term (current) drug therapy: Secondary | ICD-10-CM | POA: Insufficient documentation

## 2020-10-20 DIAGNOSIS — J449 Chronic obstructive pulmonary disease, unspecified: Secondary | ICD-10-CM | POA: Insufficient documentation

## 2020-10-20 DIAGNOSIS — R079 Chest pain, unspecified: Secondary | ICD-10-CM

## 2020-10-20 DIAGNOSIS — I493 Ventricular premature depolarization: Secondary | ICD-10-CM

## 2020-10-20 DIAGNOSIS — Z7982 Long term (current) use of aspirin: Secondary | ICD-10-CM | POA: Insufficient documentation

## 2020-10-20 DIAGNOSIS — F17210 Nicotine dependence, cigarettes, uncomplicated: Secondary | ICD-10-CM | POA: Diagnosis not present

## 2020-10-20 LAB — CBC
HCT: 43 % (ref 39.0–52.0)
Hemoglobin: 14.9 g/dL (ref 13.0–17.0)
MCH: 35.7 pg — ABNORMAL HIGH (ref 26.0–34.0)
MCHC: 34.7 g/dL (ref 30.0–36.0)
MCV: 103.1 fL — ABNORMAL HIGH (ref 80.0–100.0)
Platelets: 274 10*3/uL (ref 150–400)
RBC: 4.17 MIL/uL — ABNORMAL LOW (ref 4.22–5.81)
RDW: 13.5 % (ref 11.5–15.5)
WBC: 10.3 10*3/uL (ref 4.0–10.5)
nRBC: 0 % (ref 0.0–0.2)

## 2020-10-20 LAB — BASIC METABOLIC PANEL
Anion gap: 7 (ref 5–15)
BUN: 14 mg/dL (ref 6–20)
CO2: 23 mmol/L (ref 22–32)
Calcium: 9.1 mg/dL (ref 8.9–10.3)
Chloride: 100 mmol/L (ref 98–111)
Creatinine, Ser: 1.05 mg/dL (ref 0.61–1.24)
GFR, Estimated: >60 mL/min (ref >60)
Glucose, Bld: 106 mg/dL — ABNORMAL HIGH (ref 70–99)
Potassium: 3.2 mmol/L — ABNORMAL LOW (ref 3.5–5.1)
Sodium: 130 mmol/L — ABNORMAL LOW (ref 135–145)

## 2020-10-20 LAB — TROPONIN I (HIGH SENSITIVITY)
Troponin I (High Sensitivity): 6 ng/L (ref <18)
Troponin I (High Sensitivity): 6 ng/L (ref <18)

## 2020-10-20 LAB — MAGNESIUM: Magnesium: 2 mg/dL (ref 1.7–2.4)

## 2020-10-20 MED ORDER — POTASSIUM CHLORIDE ER 10 MEQ PO TBCR: 20.0000 meq | EXTENDED_RELEASE_TABLET | Freq: Two times a day (BID) | ORAL | 0 refills | Status: DC

## 2020-10-20 MED ORDER — POTASSIUM CHLORIDE CRYS ER 20 MEQ PO TBCR
40.0000 meq | EXTENDED_RELEASE_TABLET | Freq: Once | ORAL | Status: AC
  Administered 2020-10-20: 40 meq via ORAL
  Filled 2020-10-20: qty 2

## 2020-10-20 NOTE — Discharge Instructions (Addendum)
Your exam and your lab tests are reassuring today.  As discussed you do have a low potassium which can possibly cause arrhythmias such as PVCs.  Since your symptoms are resolved and your other lab tests, EKG and x-rays are stable it is safe you to go home.  I do want you to see Dr. Harl Bowie in close office follow-up within the next 1 to 2 weeks.  You have been prescribed additional potassium tablets.  Get rechecked immediately for any new or worsening symptoms.

## 2020-10-20 NOTE — ED Triage Notes (Signed)
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

## 2020-10-20 NOTE — ED Provider Notes (Signed)
[facility] EMERGENCY DEPARTMENT Provider Note   CSN: [id number] Arrival date & time: 10/20/20  1512     History Chief Complaint  Patient presents with   Chest Pain    Timothy Walker is a 55 y.o. male with a history as outlined below, most significant for history of CAD, had an MI 1 year ago with stent placement.  Also history of GERD, hypertension, sleep apnea and chronic tobacco abuse presenting for evaluation of chest discomfort which he describes as a frequent palpitation which started this morning while at work.  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  He denies shortness of breath, radiation of pain, nausea vomiting, diaphoresis or abdominal pain.  He did have some pain radiated into his right shoulder blade when his symptoms first began, his symptoms are almost resolved at this time.  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  He has had no treatment prior to arrival.  The history is provided by the patient.      Past Medical History:  Diagnosis Date   COPD (chronic obstructive pulmonary disease) ([address])    Coronary artery disease    a. 12/2012 Inf STEMI/Cath/PCI: LM nl, LAD nl, D1 50ost, D2 min irregs, D3 small, LCX 80-66m OM1/2/3 min irregs, RI 20p, RCA 50p/100d (3.5x18 Xience DEs),PDA/PL nl, EF [id number] complicated by VF/CGS/IABP/VDRF   GERD (gastroesophageal reflux disease)    Hiatal hernia    Hypertension    Ischemic cardiomyopathy    a. 12/2012 Echo: EF 45-50%, basal inf, inflat, mid inf HK, mildly reduced RV fxn.   Marijuana abuse    MI (myocardial infarction) ([facility] 2014   Obstructive sleep apnea    Tobacco abuse     Patient Active Problem List   Diagnosis Date Noted   Frequent PVCs 10/09/2019   NSTEMI (non-ST elevated myocardial infarction) ([place] 10/07/2019   Ventricular tachycardia ([facility] 10/07/2019   Wrist  fracture, closed 10/09/2018   Unstable angina ([facility] 09/25/2018   COPD (chronic obstructive pulmonary disease) ([facility]    Essential hypertension    Hyperlipidemia with target LDL less than 70    Alcohol use    Palpitations    Anal condyloma 01/21/2018   Bloating 01/21/2018   GERD (gastroesophageal reflux disease) 01/14/2018   Esophageal dysphagia 01/14/2018   Hemorrhoids 10/01/2015   Mucosal abnormality of esophagus    Hepatomegaly 04/24/2014   Dysphagia, pharyngoesophageal phase 04/24/2014   Hematochezia 04/24/2014   Ventricular fibrillation ([facility] 01/17/2013   Coronary artery disease involving native coronary artery of native heart with unstable angina pectoris ([facility] 01/17/2013   Hypotension 01/17/2013   ST elevation myocardial infarction (STEMI) of inferior wall, initial episode of care ([place] 01/16/2013   Acute and chronic respiratory failure 01/13/2013   Tobacco abuse 01/13/2013   Heart disease 01/13/2013   Cardiogenic shock ([facility] 01/13/2013    Past Surgical History:  Procedure Laterality Date   COLONOSCOPY WITH PROPOFOL N/A 05/14/2014   Dr. RGala Romney anal canal hemorrhoid, rectosigmoid hyerpplastic polyp, right-sided divetriculosis    CORONARY ANGIOPLASTY  01/13/2013   STENT TO RCA BY DR CBurt Knack  CORONARY STENT INTERVENTION N/A 10/07/2019   Procedure: CORONARY STENT INTERVENTION;  Surgeon: HLeonie Man MD;  [facility];  Service: Cardiovascular;  Laterality: N/A;   CORONARY/GRAFT ACUTE MI REVASCULARIZATION N/A 10/07/2019   Procedure: Coronary/Graft Acute MI  Revascularization;  Surgeon: Leonie Man, MD;  Location: [facility];  Service: Cardiovascular;  Laterality: N/A;   DENTAL SURGERY     ESOPHAGEAL DILATION N/A 05/14/2014   Procedure: ESOPHAGEAL DILATION [place];  Surgeon: Daneil Dolin, MD;  Location: [facility];  Service: Endoscopy;  Laterality: N/A;   ESOPHAGOGASTRODUODENOSCOPY (EGD) WITH PROPOFOL N/A 05/14/2014   Dr. Gala Romney: empiric  dilation, abnormal esophagus s/p biopsy (normal), small hiatal hernia   LEFT HEART CATH AND CORONARY ANGIOGRAPHY N/A 09/26/2018   Procedure: LEFT HEART CATH AND CORONARY ANGIOGRAPHY;  Surgeon: Lorretta Harp, MD;  Location: [place] [facility];  Service: Cardiovascular;  Laterality: N/A;   LEFT HEART CATH AND CORONARY ANGIOGRAPHY N/A 10/07/2019   Procedure: LEFT HEART CATH AND CORONARY ANGIOGRAPHY;  Surgeon: Leonie Man, MD;  Location: [place] [facility];  Service: Cardiovascular;  Laterality: N/A;   LEFT HEART CATHETERIZATION WITH CORONARY ANGIOGRAM N/A 01/13/2013   Procedure: LEFT HEART CATHETERIZATION WITH CORONARY ANGIOGRAM;  Surgeon: Wellington Hampshire, MD;  Location: [facility];  Service: Cardiovascular;  Laterality: N/A;   NASAL SEPTUM SURGERY     PERCUTANEOUS CORONARY STENT INTERVENTION (PCI-S)  01/13/2013   Procedure: PERCUTANEOUS CORONARY STENT INTERVENTION (PCI-S);  Surgeon: Wellington Hampshire, MD;  Location: [facility];  Service: Cardiovascular;;   POLYPECTOMY  05/14/2014   Procedure: POLYPECTOMY;  Surgeon: Daneil Dolin, MD;  Location: [facility];  Service: Endoscopy;;       Family History  Problem Relation Age of Onset   Heart attack Other    Hypertension Other    Colon cancer Neg Hx     Social History   Tobacco Use   Smoking status: Every Day    Packs/day: 1.00    Years: 31.00    Pack years: 31.00    Types: Cigarettes    Start date: 11/21/1981   Smokeless tobacco: Never  Vaping Use   Vaping Use: Some days   Substances: Nicotine, CBD  Substance Use Topics   Alcohol use: Yes    Alcohol/week: 2.0 standard drinks    Types: 1 Cans of beer, 1 Shots of liquor per week    Comment: social   Drug use: Yes    Types: Marijuana    Comment: last night    Home Medications Prior to Admission medications   Medication Sig Start Date End Date Taking? Authorizing Provider  potassium chloride (KLOR-CON) 10 MEQ tablet Take 2 tablets (20 mEq total) by mouth 2 (two) times daily  for 10 days. 10/20/20 10/30/20 Yes Charnese Federici, Almyra Free, PA-C  amLODipine (NORVASC) 5 MG tablet Take 1 tablet (5 mg total) by mouth daily. Patient not taking: Reported on 11/21/2019 10/21/19 01/19/20  Verta Ellen., NP  aspirin 81 MG tablet Take 81 mg by mouth daily.     [provider]  atorvastatin (LIPITOR) 80 MG tablet TAKE 1 TABLET (80 MG TOTAL) BY MOUTH DAILY AT 6 PM. 02/03/20   Branch, Alphonse Guild, MD  cyclobenzaprine (FLEXERIL) 10 MG tablet Take 1 tablet (10 mg total) by mouth at bedtime. 10/28/19   Wurst, [place], PA-C  diltiazem (CARDIZEM) 30 MG tablet TAKE 1 TABLET (30 MG TOTAL) BY MOUTH DAILY AS NEEDED (FOR PALPITATIONS (PREFERABLY AT BEDTIME)). 11/12/19   Verta Ellen., NP  losartan (COZAAR) 100 MG tablet Take 1 tablet (100 mg total) by mouth daily. 10/03/19   Verta Ellen., NP  metoprolol tartrate (LOPRESSOR) 100 MG tablet TAKE 1 TABLET BY MOUTH  TWICE A DAY 11/03/19   Arnoldo Lenis, MD  metoprolol tartrate (LOPRESSOR) 50 MG tablet TAKE 1 TABLET (50 MG TOTAL) BY MOUTH 2 (TWO) TIMES DAILY. TAKE 1 TABLET ('50MG'$ )TWICE DAILY IN ADDITION TO '100MG'$  TABLET FOR TOTAL OF '150MG'$  TWICE DAILY. 04/05/20   Arnoldo Lenis, MD  nitroGLYCERIN (NITROSTAT) 0.4 MG SL tablet Place 1 tablet (0.4 mg total) under the tongue every 5 (five) minutes as needed for chest pain. 10/01/18   Arnoldo Lenis, MD  pantoprazole (PROTONIX) 40 MG tablet TAKE 1 TABLET BY MOUTH TWICE A DAY 11/03/19   Arnoldo Lenis, MD  SPIRIVA HANDIHALER 18 MCG inhalation capsule INHALE 1 CAPSULE VIA HANDIHALER ONCE DAILY AT THE SAME TIME EVERY DAY 11/03/19   Arnoldo Lenis, MD  ticagrelor (BRILINTA) 90 MG TABS tablet Take 1 tablet (90 mg total) by mouth 2 (two) times daily. 10/09/19   Darreld Mclean, PA-C    Allergies    Patient has no known allergies.  Review of Systems   Review of Systems  Constitutional:  Negative for chills and fever.  HENT:  Negative for congestion and sore throat.   Eyes: Negative.    Respiratory:  Negative for cough, chest tightness, shortness of breath and wheezing.   Cardiovascular:  Positive for chest pain and palpitations. Negative for leg swelling.  Gastrointestinal:  Negative for abdominal pain, nausea and vomiting.  Genitourinary: Negative.   Musculoskeletal:  Negative for arthralgias, joint swelling and neck pain.  Skin: Negative.  Negative for rash and wound.  Neurological:  Negative for dizziness, weakness, light-headedness, numbness and headaches.  Psychiatric/Behavioral: Negative.    All other systems reviewed and are negative.  Physical Exam Updated Vital Signs BP (!) 151/100   Pulse 67   Temp 98.6 F (37 C)   Resp 20   Ht '5\' 11"'$  (1.803 m)   Wt 83.9 kg   SpO2 97%   BMI 25.80 kg/m   Physical Exam Vitals and nursing note reviewed.  Constitutional:      Appearance: He is well-developed.  HENT:     Head: Normocephalic and atraumatic.  Eyes:     Conjunctiva/sclera: Conjunctivae normal.  Cardiovascular:     Rate and Rhythm: Normal rate and regular rhythm. Extrasystoles are present.    Heart sounds: Normal heart sounds.  Pulmonary:     Effort: Pulmonary effort is normal.     Breath sounds: Normal breath sounds. No wheezing.  Abdominal:     General: Bowel sounds are normal.     Palpations: Abdomen is soft.     Tenderness: There is no abdominal tenderness.  Musculoskeletal:        General: Normal range of motion.     Cervical back: Normal range of motion.     Right lower leg: No edema.     Left lower leg: No edema.  Skin:    General: Skin is warm and dry.  Neurological:     Mental Status: He is alert.    ED Results / Procedures / Treatments   Labs (all labs ordered are listed, but only abnormal results are displayed) Labs Reviewed  BASIC METABOLIC PANEL - Abnormal; Notable for the following components:      Result Value   Sodium 130 (*)    Potassium 3.2 (*)    Glucose, Bld 106 (*)    All other components within normal limits   CBC - Abnormal; Notable for the following components:   RBC 4.17 (*)    MCV  103.1 (*)    MCH 35.7 (*)    All other components within normal limits  MAGNESIUM  TROPONIN I (HIGH SENSITIVITY)  TROPONIN I (HIGH SENSITIVITY)    EKG EKG Interpretation  Date/Time:  Wednesday October 20 2020 18:22:51 EDT Ventricular Rate:  69 PR Interval:  158 QRS Duration: 129 QT Interval:  424 QTC Calculation: 455 R Axis:   34 Text Interpretation: Sinus rhythm IVCD, consider atypical LBBB Since last tracing of earlier today pvc resolved Otherwise no significant change Confirmed by Daleen Bo [phone]) on 10/20/2020 7:13:42 PM  Radiology DG Chest 2 View  Result Date: 10/20/2020 CLINICAL DATA:  chest pain EXAM: CHEST - 2 VIEW COMPARISON:  October 07, 2019 FINDINGS: The cardiomediastinal silhouette is unchanged in contour.Mildly tortuous thoracic aorta. No pleural effusion. No pneumothorax. No acute pleuroparenchymal abnormality. Visualized abdomen is unremarkable. Mild degenerative changes of the thoracic spine. IMPRESSION: No acute cardiopulmonary abnormality. Electronically Signed   By: Valentino Saxon MD   On: 10/20/2020 16:12    Procedures Procedures   Medications Ordered in ED Medications  potassium chloride SA (KLOR-CON) CR tablet 40 mEq (40 mEq Oral Given 10/20/20 1732)    ED Course  I have reviewed the triage vital signs and the nursing notes.  Pertinent labs & imaging results that were available during my care of the patient were reviewed by me and considered in my medical decision making (see chart for details).    MDM Rules/Calculators/A&P                           Labs and imaging, EKG reviewed.  Patient with mild hypokalemia at 3.2 which was replaced with p.o. potassium.  During his ED stay he was noted to have a 4-1 pattern PVC.  Towards the end of his visit this pattern completely resolved and had a normal sinus rhythm without any arrhythmia.  His symptoms also completely resolved  and he was back to his baseline without complaints of chest pain, shortness of breath or any other complaints.  We discussed need for close office follow-up with Dr. Harl Bowie, and strict report return precautions were also outlined, returning here for any return of his palpitations.  He was prescribed a quantity of potassium to ensure that he completely replaces this electrolyte.  His magnesium level was normal.  The patient appears reasonably screened and/or stabilized for discharge and I doubt any other medical condition or other [facility] requiring further screening, evaluation, or treatment in the ED at this time prior to discharge.  Final Clinical Impression(s) / ED Diagnoses Final diagnoses:  Nonspecific chest pain  Frequent PVCs  Hypokalemia    Rx / DC Orders ED Discharge Orders          Ordered    potassium chloride (KLOR-CON) 10 MEQ tablet  2 times daily        10/20/20 1905             Landis Martins 10/20/20 1929    Daleen Bo, MD 10/21/20 1105

## 2020-10-21 ENCOUNTER — Other Ambulatory Visit: Payer: Self-pay | Admitting: Family Medicine

## 2020-10-24 ENCOUNTER — Other Ambulatory Visit: Payer: Self-pay | Admitting: Cardiology

## 2020-10-28 ENCOUNTER — Other Ambulatory Visit: Payer: Self-pay

## 2020-10-28 ENCOUNTER — Encounter: Payer: Self-pay | Admitting: Cardiology

## 2020-10-28 ENCOUNTER — Ambulatory Visit: Payer: BC Managed Care – PPO | Admitting: Cardiology

## 2020-10-28 VITALS — BP 142/94 | HR 76 | Ht 71.0 in | Wt 188.0 lb

## 2020-10-28 DIAGNOSIS — I10 Essential (primary) hypertension: Secondary | ICD-10-CM

## 2020-10-28 DIAGNOSIS — I2510 Atherosclerotic heart disease of native coronary artery without angina pectoris: Secondary | ICD-10-CM

## 2020-10-28 DIAGNOSIS — I493 Ventricular premature depolarization: Secondary | ICD-10-CM

## 2020-10-28 DIAGNOSIS — Z7689 Persons encountering health services in other specified circumstances: Secondary | ICD-10-CM

## 2020-10-28 DIAGNOSIS — I5022 Chronic systolic (congestive) heart failure: Secondary | ICD-10-CM

## 2020-10-28 MED ORDER — DILTIAZEM HCL 30 MG PO TABS: 30.0000 mg | ORAL_TABLET | Freq: Two times a day (BID) | ORAL | 6 refills | Status: DC

## 2020-10-28 MED ORDER — INCRUSE ELLIPTA 62.5 MCG/INH IN AEPB: 1.0000 | INHALATION_SPRAY | Freq: Every day | RESPIRATORY_TRACT | 2 refills | Status: DC

## 2020-10-28 NOTE — Progress Notes (Signed)
Clinical Summary Timothy Walker is a 55 y.o.maleseen today for follow up of the following medical problems.    1. CAD   - admit to [facility] 01/13/13 to 01/17/13 with STEMI, DES to RCA.   - patient presented in cardiogenic shock, suffered VT/VF arrest requiring multiple defibrillations and IV amiodarone. Following PCI required balloon pump and vasopressors and temporary transvenous pacing.   - Echo LVEF 45-50%, inferior hypokinesis, normal diastolic function       0000000 cath mid LAD 30%, ramus 30%, LCX mid 90% small nondom and stable, RCA 30% prox. 09/2018 echo LVEF [id number], grade I diastolic dysfunction, basal mid inferior akinesis.     09/2019 admitted with NSTEMI - had DES to LCX, during procedure VT requiring defib - Echo showed LVEF of 40-45%   09/2020 ER visit with chest pain - trop neg x 2, EKG SR with frequent PVCs - he was at work, feeling of heart skipping. Had some SOB, pain across chest. Evaluated by his work medical provider,tele showed PVCs.  - in ER symptoms resolved with resolution of PVCs. K was 3.2, Mg 2 - still with symptoms at times.  - taking metoprolol '150mg'$  bid -SOB on brillinta    2. Hypokalemia - noted during recent ER visit    3. HTN   -he is compliant with meds   4. Hyperlipidemia   - compliatn with statin   5. COPD - abnormal PFTs in 2015 - mixed compliance with inhalers.    6. PVCs - long history of PVCs - frequent PVCs noted during recent admission, beta blocker titrated up - ongoing symptoms of palpitations, heart fluttering with some chest discomfort   Past Medical History:  Diagnosis Date   COPD (chronic obstructive pulmonary disease) ([place])    Coronary artery disease    a. 12/2012 Inf STEMI/Cath/PCI: LM nl, LAD nl, D1 50ost, D2 min irregs, D3 small, LCX 80-52m OM1/2/3 min irregs, RI 20p, RCA 50p/100d (3.5x18 Xience DEs),PDA/PL nl, EF [id number] complicated by VF/CGS/IABP/VDRF   GERD (gastroesophageal reflux disease)    Hiatal  hernia    Hypertension    Ischemic cardiomyopathy    a. 12/2012 Echo: EF 45-50%, basal inf, inflat, mid inf HK, mildly reduced RV fxn.   Marijuana abuse    [place]   Obstructive sleep apnea    Tobacco abuse      No Known Allergies   Current Outpatient Medications  Medication Sig Dispense Refill   amLODipine (NORVASC) 5 MG tablet Take 1 tablet (5 mg total) by mouth daily. (Patient not taking: Reported on 11/21/2019) 90 tablet 1   aspirin 81 MG tablet Take 81 mg by mouth daily.      atorvastatin (LIPITOR) 80 MG tablet TAKE 1 TABLET (80 MG TOTAL) BY MOUTH DAILY AT 6 PM. 30 tablet 0   cyclobenzaprine (FLEXERIL) 10 MG tablet Take 1 tablet (10 mg total) by mouth at bedtime. 15 tablet 0   diltiazem (CARDIZEM) 30 MG tablet TAKE 1 TABLET (30 MG TOTAL) BY MOUTH DAILY AS NEEDED (FOR PALPITATIONS (PREFERABLY AT BEDTIME)). 30 tablet 2   losartan (COZAAR) 100 MG tablet TAKE 1 TABLET BY MOUTH EVERY DAY 30 tablet 0   metoprolol tartrate (LOPRESSOR) 100 MG tablet TAKE 1 TABLET BY MOUTH TWICE A DAY 60 tablet 0   metoprolol tartrate (LOPRESSOR) 50 MG tablet TAKE 1 TABLET ('50MG'$ )TWICE DAILY IN ADDITION TO '100MG'$  TABLET FOR TOTAL OF '150MG'$  TWICE DAILY. 60 tablet 0   nitroGLYCERIN (  NITROSTAT) 0.4 MG SL tablet Place 1 tablet (0.4 mg total) under the tongue every 5 (five) minutes as needed for chest pain. 25 tablet 3   pantoprazole (PROTONIX) 40 MG tablet TAKE 1 TABLET BY MOUTH TWICE A DAY 60 tablet 0   potassium chloride (KLOR-CON) 10 MEQ tablet Take 2 tablets (20 mEq total) by mouth 2 (two) times daily for 10 days. 40 tablet 0   SPIRIVA HANDIHALER 18 MCG inhalation capsule INHALE 1 CAPSULE VIA HANDIHALER ONCE DAILY AT THE SAME TIME EVERY DAY 30 capsule 0   ticagrelor (BRILINTA) 90 MG TABS tablet Take 1 tablet (90 mg total) by mouth 2 (two) times daily. 60 tablet 11   No current facility-administered medications for this visit.     Past Surgical History:  Procedure Laterality  Date   COLONOSCOPY WITH PROPOFOL N/A 05/14/2014   Dr. Gala Romney: anal canal hemorrhoid, rectosigmoid hyerpplastic polyp, right-sided divetriculosis    CORONARY ANGIOPLASTY  01/13/2013   STENT TO RCA BY DR Burt Knack   CORONARY STENT INTERVENTION N/A 10/07/2019   Procedure: CORONARY STENT INTERVENTION;  Surgeon: Leonie Man, MD;  Location: [place] [facility];  Service: Cardiovascular;  Laterality: N/A;   CORONARY/GRAFT ACUTE MI REVASCULARIZATION N/A 10/07/2019   Procedure: Coronary/Graft Acute MI Revascularization;  Surgeon: Leonie Man, MD;  Location: [place] [facility];  Service: Cardiovascular;  Laterality: N/A;   DENTAL SURGERY     ESOPHAGEAL DILATION N/A 05/14/2014   Procedure: ESOPHAGEAL DILATION [place];  Surgeon: Daneil Dolin, MD;  Location: [facility];  Service: Endoscopy;  Laterality: N/A;   ESOPHAGOGASTRODUODENOSCOPY (EGD) WITH PROPOFOL N/A 05/14/2014   Dr. Gala Romney: empiric dilation, abnormal esophagus s/p biopsy (normal), small hiatal hernia   LEFT HEART CATH AND CORONARY ANGIOGRAPHY N/A 09/26/2018   Procedure: LEFT HEART CATH AND CORONARY ANGIOGRAPHY;  Surgeon: Lorretta Harp, MD;  Location: [place] [facility];  Service: Cardiovascular;  Laterality: N/A;   LEFT HEART CATH AND CORONARY ANGIOGRAPHY N/A 10/07/2019   Procedure: LEFT HEART CATH AND CORONARY ANGIOGRAPHY;  Surgeon: Leonie Man, MD;  Location: [place] [facility];  Service: Cardiovascular;  Laterality: N/A;   LEFT HEART CATHETERIZATION WITH CORONARY ANGIOGRAM N/A 01/13/2013   Procedure: LEFT HEART CATHETERIZATION WITH CORONARY ANGIOGRAM;  Surgeon: Wellington Hampshire, MD;  Location: [place] C[facility];  Service: Cardiovascular;  Laterality: N/A;   NASAL SEPTUM SURGERY     PERCUTANEOUS CORONARY STENT INTERVENTION (PCI-S)  01/13/2013   Procedure: PERCUTANEOUS CORONARY STENT INTERVENTION (PCI-S);  Surgeon: Wellington Hampshire, MD;  Location[facility];  Service: Cardiovascular;;   POLYPECTOMY  05/14/2014   Procedure: POLYPECTOMY;   Surgeon: Daneil Dolin, MD;  Location: [facility];  Service: Endoscopy;;     No Known Allergies    Family History  Problem Relation Age of Onset   Heart attack Other    Hypertension Other    Colon cancer Neg Hx      Social History Timothy Walker reports that he has been smoking cigarettes. He started smoking about 38 years ago. He has a 31.00 pack-year smoking history. He has never used smokeless tobacco. Timothy Walker reports current alcohol use of about 2.0 standard drinks of alcohol per week.   Review of Systems CONSTITUTIONAL: No weight loss, fever, chills, weakness or fatigue.  HEENT: Eyes: No visual loss, blurred vision, double vision or yellow sclerae.No hearing loss, sneezing, congestion, runny nose or sore throat.  SKIN: No rash or itching.  CARDIOVASCULAR: per hpi RESPIRATORY: No shortness  of breath, cough or sputum.  GASTROINTESTINAL: No anorexia, nausea, vomiting or diarrhea. No abdominal pain or blood.  GENITOURINARY: No burning on urination, no polyuria NEUROLOGICAL: No headache, dizziness, syncope, paralysis, ataxia, numbness or tingling in the extremities. No change in bowel or bladder control.  MUSCULOSKELETAL: No muscle, back pain, joint pain or stiffness.  LYMPHATICS: No enlarged nodes. No history of splenectomy.  PSYCHIATRIC: No history of depression or anxiety.  ENDOCRINOLOGIC: No reports of sweating, cold or heat intolerance. No polyuria or polydipsia.  Marland Kitchen   Physical Examination Today's Vitals   10/28/20 1513  BP: (!) 142/94  Pulse: 76  SpO2: 97%  Weight: 188 lb (85.3 kg)  Height: '5\' 11"'$  (1.803 m)   Body mass index is 26.22 kg/m.  Gen: resting comfortably, no acute distress HEENT: no scleral icterus, pupils equal round and reactive, no palptable cervical adenopathy,  CV: RRR, no m/r/g no jvd Resp: Clear to auscultation bilaterally GI: abdomen is soft, non-tender, non-distended, normal bowel sounds, no hepatosplenomegaly MSK: extremities are warm, no  edema.  Skin: warm, no rash Neuro:  no focal deficits Psych: appropriate affect   Diagnostic Studies  09/2018 echo IMPRESSIONS      1. The left ventricle has mildly reduced systolic function, with an ejection fraction of 45-50%. The cavity size was mildly dilated. There is mildly increased left ventricular wall thickness. Left ventricular diastolic Doppler parameters are consistent with impaired relaxation.  2. There is akinesis of the left ventricular, basal-mid inferior wall.  3. The right ventricle has normal systolic function. The cavity was normal. There is no increase in right ventricular wall thickness. Right ventricular systolic pressure is mildly elevated with an estimated pressure of 39.8 mmHg.  4. The mitral valve is grossly normal. There is mild mitral regurgitation.  5. The tricuspid valve is grossly normal.  6. The aortic valve is tricuspid.  7. The aortic root is normal in size and structure.  8. The inferior vena cava was dilated in size with >50% respiratory variability.   09/2018 cath Previously placed Mid RCA to Dist RCA stent (unknown type) is widely patent. Mid Cx lesion is 90% stenosed. Mid LAD lesion is 30% stenosed. Prox RCA lesion is 30% stenosed. Ramus lesion is 30% stenosed. There is moderate left ventricular systolic dysfunction. LV end diastolic pressure is mildly elevated. The left ventricular ejection fraction is 35-45% by visual estimate.   Timothy Walker is a 55 y.o. male IMPRESSION: Mr. Yenter distal dominant RCA stent was widely patent.  He had high-grade lesion in a nondominant circumflex similar as described at the cath in 2014.  He had moderate LV dysfunction with an EF in the 40% range and severe inferior hypokinesia.  I do not see a culprit vessel new since his previous cath.  He does have moderate LV dysfunction and medical therapy will will be recommended for this.  The sheath was removed and a TR band was placed on the right wrist to achieve  patent hemostasis.  The patient left the lab in stable condition.  He can be discharged home later today with TOC 7 followed by return office visit with Dr. Harl Bowie in 3 to 4 weeks.   09/2019 cath Left Heart Catheterization 10/07/2019: There is mild left ventricular systolic dysfunction. The left ventricular ejection fraction is 45-50% by visual LV end diastolic pressure is normal. Coronary anatomy: Prox RCA lesion is 30% stenosed. Previously placed Mid RCA to Dist RCA stent (unknown type) is widely patent. Ramus lesion is 30%  stenosed. Prox Cx to Dist Cx lesion is 100% stenosed with 99% stenosed side Maelee Hoot in 1st Mrg. LPAV lesion is 80% stenosed. A drug-eluting stent was successfully placed crossing from the small 1st Mrg into the LCx-2nd Mrg, using a SYNERGY XD 2.50X20. Post intervention, there is a 0% residual stenosis. Post intervention, the side Burkley Dech was reduced to 99% residual stenosis. A drug-eluting stent was successfully placed using a SYNERGY XD 2.50X20.   Summary: Two-Vessel Disease: With widely patent distal RCA stent and Toprol lesion being 100% proximal to mid LCx occlusion proximal to very small caliber 1st Mrg & AVG LCx. Difficult lesion to cross, but successful PTCA and DES PCI of the acutely occluded LCx using Synergy DES 2.5 mm x 20 mm-postdilated to 3.1-2.9 mm Mild proximal RCA and LAD disease. Mildly reduced EF of 45% with lateral hypokinesis. Sustained ventricular tachycardia related to angiography--s/p 1 shock DCCV restoring sinus rhythm    Recommendations: Restart postop medications but will reduce beta-blocker in half starting tonight. ->  Will need to determine appropriateness of home doses, but will dose other medications as of tomorrow. Continue home statin Cardiac rehab recommendation/consultation team to see the patient from hospital.  Echocardiogram 10/08/2019: Impressions:  1. Left ventricular ejection fraction, by estimation, is 40 to 45%. The  left  ventricle has mildly decreased function. The left ventricle has no  regional wall motion abnormalities. There is moderate asymmetric left  ventricular hypertrophy of the  anteroseptum segment. Left ventricular diastolic parameters are consistent  with Grade I diastolic dysfunction (impaired relaxation). Diffuse mild  hypokinesis of the basal to mid segments, slightly worse in the  inferolateral myocardium. There is relative  sparing of the apical segments.   2. Right ventricular systolic function is normal. The right ventricular  size is normal.   3. Right atrial size was moderately dilated.   4. The mitral valve is normal in structure. Mild mitral valve  regurgitation. No evidence of mitral stenosis.   5. The aortic valve is tricuspid. Aortic valve regurgitation is not  visualized. No aortic stenosis is present.   6. Aortic dilatation noted. There is mild dilatation of the ascending  aorta measuring 37 mm.   7. The inferior vena cava is normal in size with greater than 50%  respiratory variability, suggesting right atrial pressure of 3 mmHg.  Assessment and Plan  1. CAD/chest pain/ICM - has been a year since last intervention, can d/c brillinta - repeat echo, if ongoing dysfunction will need further medication changes    2. HTN   - insetting of ongoing palpitations on high dose beta blocker, d/c norvasc and start diltiazem '30mg'$  bid.    3. PVCs - remains very symptomatic despite being on high dose beta blocker - start diltiazem '30mg'$  bid - refer to EP for ongoign symptomatic PVCs despite high dose beta blocker     Arnoldo Lenis, M.D

## 2020-10-28 NOTE — Patient Instructions (Addendum)
Medication Instructions:  Stop Brilinta Scientist, physiological).  Stop Norvasc (Amlodipine).  Begin Incruse Ellipta 62.22mg - one inhalation daily - 3 fills given only.  FUTURE MAINTENANCE NEEDS TO COME FROM PRIMARY CARE PHYSICIAN.  Increase your Diltiazem to twice a day, instead of as needed.   Continue all other medications.    Labwork: BMET, Mg - orders given today.  Please do in 1 month (around 11/28/2020). Office will contact with results via phone or letter.    Testing/Procedures: Your physician has requested that you have an echocardiogram. Echocardiography is a painless test that uses sound waves to create images of your heart. It provides your doctor with information about the size and shape of your heart and how well your heart's chambers and valves are working. This procedure takes approximately one hour. There are no restrictions for this procedure. Office will contact with results via phone or letter.    Follow-Up: 6 weeks   Any Other Special Instructions Will Be Listed Below (If Applicable). [facility]  [facility](Dr. PPosey Pronto to establish with primary care physician.  You have been referred to:  EP (electrophysiology) - Dr. ARayann Heman  If you need a refill on your cardiac medications before your next appointment, please call your pharmacy.

## 2020-10-30 ENCOUNTER — Other Ambulatory Visit: Payer: Self-pay | Admitting: Cardiology

## 2020-11-03 ENCOUNTER — Other Ambulatory Visit: Payer: Self-pay | Admitting: Student

## 2020-11-04 ENCOUNTER — Encounter: Payer: Self-pay | Admitting: Internal Medicine

## 2020-12-27 ENCOUNTER — Emergency Department (HOSPITAL_COMMUNITY)
Admission: EM | Admit: 2020-12-27 | Discharge: 2020-12-27 | Disposition: A | Discharge status: Home / Self Care | Payer: BC Managed Care – PPO | Attending: Emergency Medicine | Admitting: Emergency Medicine

## 2020-12-27 ENCOUNTER — Emergency Department (HOSPITAL_COMMUNITY): Payer: BC Managed Care – PPO

## 2020-12-27 ENCOUNTER — Other Ambulatory Visit: Payer: Self-pay

## 2020-12-27 ENCOUNTER — Encounter (HOSPITAL_COMMUNITY): Payer: Self-pay | Admitting: *Deleted

## 2020-12-27 DIAGNOSIS — I10 Essential (primary) hypertension: Secondary | ICD-10-CM | POA: Diagnosis not present

## 2020-12-27 DIAGNOSIS — I2510 Atherosclerotic heart disease of native coronary artery without angina pectoris: Secondary | ICD-10-CM | POA: Insufficient documentation

## 2020-12-27 DIAGNOSIS — I493 Ventricular premature depolarization: Secondary | ICD-10-CM | POA: Diagnosis not present

## 2020-12-27 DIAGNOSIS — Z7951 Long term (current) use of inhaled steroids: Secondary | ICD-10-CM | POA: Diagnosis not present

## 2020-12-27 DIAGNOSIS — Z79899 Other long term (current) drug therapy: Secondary | ICD-10-CM | POA: Diagnosis not present

## 2020-12-27 DIAGNOSIS — J449 Chronic obstructive pulmonary disease, unspecified: Secondary | ICD-10-CM | POA: Diagnosis not present

## 2020-12-27 DIAGNOSIS — Z7982 Long term (current) use of aspirin: Secondary | ICD-10-CM | POA: Insufficient documentation

## 2020-12-27 DIAGNOSIS — Z720 Tobacco use: Secondary | ICD-10-CM

## 2020-12-27 DIAGNOSIS — R079 Chest pain, unspecified: Secondary | ICD-10-CM

## 2020-12-27 DIAGNOSIS — F17210 Nicotine dependence, cigarettes, uncomplicated: Secondary | ICD-10-CM | POA: Insufficient documentation

## 2020-12-27 DIAGNOSIS — Z955 Presence of coronary angioplasty implant and graft: Secondary | ICD-10-CM | POA: Insufficient documentation

## 2020-12-27 LAB — CBC WITH DIFFERENTIAL/PLATELET
Abs Immature Granulocytes: 0.03 10*3/uL (ref 0.00–0.07)
Basophils Absolute: 0 10*3/uL (ref 0.0–0.1)
Basophils Relative: 0 %
Eosinophils Absolute: 0.1 10*3/uL (ref 0.0–0.5)
Eosinophils Relative: 1 %
HCT: 43.6 % (ref 39.0–52.0)
Hemoglobin: 14.8 g/dL (ref 13.0–17.0)
Immature Granulocytes: 0 %
Lymphocytes Relative: 26 %
Lymphs Abs: 2.4 10*3/uL (ref 0.7–4.0)
MCH: 35.2 pg — ABNORMAL HIGH (ref 26.0–34.0)
MCHC: 33.9 g/dL (ref 30.0–36.0)
MCV: 103.8 fL — ABNORMAL HIGH (ref 80.0–100.0)
Monocytes Absolute: 0.9 10*3/uL (ref 0.1–1.0)
Monocytes Relative: 10 %
Neutro Abs: 5.6 10*3/uL (ref 1.7–7.7)
Neutrophils Relative %: 63 %
Platelets: 237 10*3/uL (ref 150–400)
RBC: 4.2 MIL/uL — ABNORMAL LOW (ref 4.22–5.81)
RDW: 13.3 % (ref 11.5–15.5)
WBC: 9 10*3/uL (ref 4.0–10.5)
nRBC: 0 % (ref 0.0–0.2)

## 2020-12-27 LAB — COMPREHENSIVE METABOLIC PANEL
ALT: 15 U/L (ref 0–44)
AST: 18 U/L (ref 15–41)
Albumin: 4 g/dL (ref 3.5–5.0)
Alkaline Phosphatase: 65 U/L (ref 38–126)
Anion gap: 8 (ref 5–15)
BUN: 7 mg/dL (ref 6–20)
CO2: 24 mmol/L (ref 22–32)
Calcium: 8.9 mg/dL (ref 8.9–10.3)
Chloride: 103 mmol/L (ref 98–111)
Creatinine, Ser: 0.64 mg/dL (ref 0.61–1.24)
GFR, Estimated: >60 mL/min (ref >60)
Glucose, Bld: 96 mg/dL (ref 70–99)
Potassium: 3.8 mmol/L (ref 3.5–5.1)
Sodium: 135 mmol/L (ref 135–145)
Total Bilirubin: 1.6 mg/dL — ABNORMAL HIGH (ref 0.3–1.2)
Total Protein: 7.4 g/dL (ref 6.5–8.1)

## 2020-12-27 LAB — BRAIN NATRIURETIC PEPTIDE: B Natriuretic Peptide: 1382 pg/mL — ABNORMAL HIGH (ref 0.0–100.0)

## 2020-12-27 MED ORDER — ALBUTEROL SULFATE HFA 108 (90 BASE) MCG/ACT IN AERS
2.0000 | INHALATION_SPRAY | RESPIRATORY_TRACT | Status: DC | PRN
  Administered 2020-12-27: 2 via RESPIRATORY_TRACT
  Filled 2020-12-27: qty 6.7

## 2020-12-27 NOTE — ED Provider Notes (Signed)
Emergency Medicine Provider Triage Evaluation Note  Timothy Walker , a 55 y.o. male  was evaluated in triage.  Pt complains of shortness of breath, chest pain, palpitations.  Onset of symptoms months ago and persistent.  He states at nighttime he feels his heart "gurgling."  He continues to smoke.  He takes his medication regularly..  Review of Systems  Positive: Chest pain, shortness of breath, palpitations Negative: Focal weakness or paresthesia, diaphoresis, nausea, cough, fever.  Physical Exam  BP (!) 165/129 (BP Location: Right Arm)   Pulse 72   Temp 98.2 F (36.8 C) (Oral)   Resp 20   Ht 5\' 11"  (1.803 m)   Wt 83.9 kg   SpO2 97%   BMI 25.80 kg/m  Gen:   Awake, no distress  Resp:  Normal effort no wheezing.  No increased work of breathing MSK:   Moves extremities without difficulty no deformities or significant edema Other:  Nontoxic appearance  Medical Decision Making  Medically screening exam initiated at 3:21 PM.  Appropriate orders placed.  Timothy Walker was informed that the remainder of the evaluation will be completed by another provider, this initial triage assessment does not replace that evaluation, and the importance of remaining in the ED until their evaluation is complete.  Complicated prior history with STEMI, NSTEMI, cardiogenic shock, chronic palpitations due to PVCs, ongoing smoking.   Daleen Bo, MD 12/27/20 1534

## 2020-12-27 NOTE — Discharge Instructions (Addendum)
Try to stop smoking.  Use the albuterol inhaler 2 puffs every 3-4 hours as needed for cough or trouble breathing.  Take your Spiriva as prescribed, twice a day.  Call your cardiology service for a follow-up appointment about the PVCs.  Call the lung doctor for an appointment to be seen about the breathing problem.

## 2020-12-27 NOTE — ED Provider Notes (Signed)
[facility] EMERGENCY DEPARTMENT Provider Note   CSN: [id number] Arrival date & time: 12/27/20  1449     History Chief Complaint  Patient presents with   Chest Pain    Timothy Walker is a 55 y.o. male.  HPI He presents for evaluation of chest pain, which started last night, and has persisted.  He sometimes hears a gurgling sensation in his chest which worries him.  He has had similar types of chest pain associated with palpitations, for many months.  He follows closely with his cardiologist.    Past Medical History:  Diagnosis Date   COPD (chronic obstructive pulmonary disease) ([place])    Coronary artery disease    a. 12/2012 Inf STEMI/Cath/PCI: LM nl, LAD nl, D1 50ost, D2 min irregs, D3 small, LCX 80-12m, OM1/2/3 min irregs, RI 20p, RCA 50p/100d (3.5x18 Xience DEs),PDA/PL nl, EF 84% - complicated by VF/CGS/IABP/VDRF   GERD (gastroesophageal reflux disease)    Hiatal hernia    Hypertension    Ischemic cardiomyopathy    a. 12/2012 Echo: EF 45-50%, basal inf, inflat, mid inf HK, mildly reduced RV fxn.   Marijuana abuse    MI (myocardial infarction) ([place]) 2014   Obstructive sleep apnea    Tobacco abuse     Patient Active Problem List   Diagnosis Date Noted   Frequent PVCs 10/09/2019   NSTEMI (non-ST elevated myocardial infarction) ([place]) 10/07/2019   Ventricular tachycardia 10/07/2019   Wrist fracture, closed 10/09/2018   Unstable angina ([place]) 09/25/2018   COPD (chronic obstructive pulmonary disease) ([place])    Essential hypertension    Hyperlipidemia with target LDL less than 70    Alcohol use    Palpitations    Anal condyloma 01/21/2018   Bloating 01/21/2018   GERD (gastroesophageal reflux disease) 01/14/2018   Esophageal dysphagia 01/14/2018   Hemorrhoids 10/01/2015   Mucosal abnormality of esophagus    Hepatomegaly 04/24/2014   Dysphagia, pharyngoesophageal phase 04/24/2014   Hematochezia 04/24/2014   Ventricular fibrillation ([place]) 01/17/2013   Coronary artery  disease involving native coronary artery of native heart with unstable angina pectoris ([place]) 01/17/2013   Hypotension 01/17/2013   ST elevation myocardial infarction (STEMI) of inferior wall, initial episode of care ([place]) 01/16/2013   Acute and chronic respiratory failure 01/13/2013   Tobacco abuse 01/13/2013   Heart disease 01/13/2013   Cardiogenic shock ([place]) 01/13/2013    Past Surgical History:  Procedure Laterality Date   COLONOSCOPY WITH PROPOFOL N/A 05/14/2014   Dr. Gala Romney: anal canal hemorrhoid, rectosigmoid hyerpplastic polyp, right-sided divetriculosis    CORONARY ANGIOPLASTY  01/13/2013   STENT TO RCA BY DR Burt Knack   CORONARY STENT INTERVENTION N/A 10/07/2019   Procedure: CORONARY STENT INTERVENTION;  Surgeon: Leonie Man, MD;  Location: [place] CV LAB;  Service: Cardiovascular;  Laterality: N/A;   CORONARY/GRAFT ACUTE MI REVASCULARIZATION N/A 10/07/2019   Procedure: Coronary/Graft Acute MI Revascularization;  Surgeon: Leonie Man, MD;  Location: [place] CV LAB;  Service: Cardiovascular;  Laterality: N/A;   DENTAL SURGERY     ESOPHAGEAL DILATION N/A 05/14/2014   Procedure: ESOPHAGEAL DILATION [place];  Surgeon: Daneil Dolin, MD;  Location: [facility];  Service: Endoscopy;  Laterality: N/A;   ESOPHAGOGASTRODUODENOSCOPY (EGD) WITH PROPOFOL N/A 05/14/2014   Dr. Gala Romney: empiric dilation, abnormal esophagus s/p biopsy (normal), small hiatal hernia   LEFT HEART CATH AND CORONARY ANGIOGRAPHY N/A 09/26/2018   Procedure: LEFT HEART CATH AND CORONARY ANGIOGRAPHY;  Surgeon: Lorretta Harp, MD;  Location: [facility]  INVASIVE CV LAB;  Service: Cardiovascular;  Laterality: N/A;   LEFT HEART CATH AND CORONARY ANGIOGRAPHY N/A 10/07/2019   Procedure: LEFT HEART CATH AND CORONARY ANGIOGRAPHY;  Surgeon: Leonie Man, MD;  Location: [place] CV LAB;  Service: Cardiovascular;  Laterality: N/A;   LEFT HEART CATHETERIZATION WITH CORONARY ANGIOGRAM N/A 01/13/2013   Procedure: LEFT  HEART CATHETERIZATION WITH CORONARY ANGIOGRAM;  Surgeon: Wellington Hampshire, MD;  Location: [place] CATH LAB;  Service: Cardiovascular;  Laterality: N/A;   NASAL SEPTUM SURGERY     PERCUTANEOUS CORONARY STENT INTERVENTION (PCI-S)  01/13/2013   Procedure: PERCUTANEOUS CORONARY STENT INTERVENTION (PCI-S);  Surgeon: Wellington Hampshire, MD;  Location: [facility] CATH LAB;  Service: Cardiovascular;;   POLYPECTOMY  05/14/2014   Procedure: POLYPECTOMY;  Surgeon: Daneil Dolin, MD;  Location: [facility];  Service: Endoscopy;;       Family History  Problem Relation Age of Onset   Heart attack Other    Hypertension Other    Colon cancer Neg Hx     Social History   Tobacco Use   Smoking status: Every Day    Packs/day: 0.75    Years: 31.00    Pack years: 23.25    Types: Cigarettes    Start date: 11/21/1981   Smokeless tobacco: Never  Vaping Use   Vaping Use: Some days   Substances: Nicotine, CBD  Substance Use Topics   Alcohol use: Yes    Alcohol/week: 2.0 standard drinks    Types: 1 Cans of beer, 1 Shots of liquor per week    Comment: 3-5 shots of liquor daily   Drug use: Yes    Types: Marijuana    Comment: last night    Home Medications Prior to Admission medications   Medication Sig Start Date End Date Taking? Authorizing Provider  losartan (COZAAR) 100 MG tablet Take by mouth. 12/10/19  Yes [provider]  amLODipine (NORVASC) 2.5 MG tablet Take by mouth.    [provider]  aspirin 81 MG tablet Take 81 mg by mouth daily.     [provider]  atenolol (TENORMIN) 100 MG tablet     [provider]  atorvastatin (LIPITOR) 80 MG tablet TAKE 1 TABLET BY MOUTH DAILY AT 6 PM. 11/01/20   Arnoldo Lenis, MD  BRILINTA 90 MG TABS tablet TAKE 1 TABLET BY MOUTH TWICE A DAY 11/04/20   Arnoldo Lenis, MD  cyclobenzaprine (FLEXERIL) 10 MG tablet Take 1 tablet (10 mg total) by mouth at bedtime. 10/28/19   [place], [place], PA-C  diltiazem (CARDIZEM) 30 MG tablet Take 1 tablet  (30 mg total) by mouth 2 (two) times daily. 10/28/20   Arnoldo Lenis, MD  losartan (COZAAR) 100 MG tablet TAKE 1 TABLET BY MOUTH EVERY DAY 11/01/20   Arnoldo Lenis, MD  metoprolol tartrate (LOPRESSOR) 100 MG tablet TAKE 1 TABLET BY MOUTH TWICE A DAY Patient taking differently: Take 100 mg by mouth 2 (two) times daily. Take 100 mg twice daily in addition to 50 mg twice daily for a total of 150 mg twice daily 11/01/20   Arnoldo Lenis, MD  metoprolol tartrate (LOPRESSOR) 50 MG tablet TAKE 1 TABLET (50MG )TWICE DAILY IN ADDITION TO 100MG  TABLET FOR TOTAL OF 150MG  TWICE DAILY. Patient taking differently: Take 50 mg by mouth 2 (two) times daily. Take 50 mg twice daily in addition to 100 mg tablet for total of 150 mg tablet for a total of 150 mg twice daily 11/01/20  Arnoldo Lenis, MD  metoprolol-hydrochlorothiazide (LOPRESSOR HCT) 100-50 MG tablet Take by mouth.    [provider]  nitroGLYCERIN (NITROSTAT) 0.4 MG SL tablet Place 1 tablet (0.4 mg total) under the tongue every 5 (five) minutes as needed for chest pain. 10/01/18   Arnoldo Lenis, MD  pantoprazole (PROTONIX) 40 MG tablet TAKE 1 TABLET BY MOUTH TWICE A DAY 11/01/20   Arnoldo Lenis, MD  pantoprazole (PROTONIX) 40 MG tablet Take by mouth.    [provider]  potassium chloride (KLOR-CON) 10 MEQ tablet Take 2 tablets (20 mEq total) by mouth 2 (two) times daily for 10 days. 10/20/20 10/30/20  Evalee Jefferson, PA-C  umeclidinium bromide (INCRUSE ELLIPTA) 62.5 MCG/INH AEPB Inhale 1 puff into the lungs daily. 10/28/20   Arnoldo Lenis, MD    Allergies    Patient has no known allergies.  Review of Systems   Review of Systems  Physical Exam Updated Vital Signs BP (!) 168/102 (BP Location: Right Arm)   Pulse 70   Temp 98 F (36.7 C) (Oral)   Resp 20   Ht 5\' 11"  (1.803 m)   Wt 83.9 kg   SpO2 97%   BMI 25.80 kg/m   Physical Exam  ED Results / Procedures / Treatments   Labs (all labs ordered are listed,  but only abnormal results are displayed) Labs Reviewed  COMPREHENSIVE METABOLIC PANEL - Abnormal; Notable for the following components:      Result Value   Total Bilirubin 1.6 (*)    All other components within normal limits  BRAIN NATRIURETIC PEPTIDE - Abnormal; Notable for the following components:   B Natriuretic Peptide 1,382.0 (*)    All other components within normal limits  CBC WITH DIFFERENTIAL/PLATELET - Abnormal; Notable for the following components:   RBC 4.20 (*)    MCV 103.8 (*)    MCH 35.2 (*)    All other components within normal limits    EKG EKG Interpretation  Date/Time:  Monday December 27 2020 15:10:49 EDT Ventricular Rate:  68 PR Interval:  154 QRS Duration: 140 QT Interval:  436 QTC Calculation: 463 R Axis:   43 Text Interpretation: Normal sinus rhythm Possible Left atrial enlargement Left ventricular hypertrophy with QRS widening ( Sokolow-Lyon , Cornell product , Romhilt-Estes ) Abnormal ECG since last tracing no significant change Confirmed by Daleen Bo [id number]) on 12/27/2020 3:35:36 PM  Radiology DG Chest 2 View  Result Date: 12/27/2020 CLINICAL DATA:  Chest pain. EXAM: CHEST - 2 VIEW COMPARISON:  Chest x-ray 09/20/2020. chest x-ray 06/11/2015. FINDINGS: The heart size and mediastinal contours are within normal limits. Mild focal narrowing of the mid trachea appears unchanged from 2015. Both lungs are clear. The visualized skeletal structures are unremarkable. IMPRESSION: No active cardiopulmonary disease. Electronically Signed   By: Ronney Asters M.D.   On: 12/27/2020 16:03    Procedures Procedures   Medications Ordered in ED Medications  albuterol (VENTOLIN HFA) 108 (90 Base) MCG/ACT inhaler 2 puff (2 puffs Inhalation Given 12/27/20 2127)    ED Course  I have reviewed the triage vital signs and the nursing notes.  Pertinent labs & imaging results that were available during my care of the patient were reviewed by me and considered in my medical  decision making (see chart for details).    MDM Rules/Calculators/A&P  Patient Vitals for the past 24 hrs:  BP Temp Temp src Pulse Resp SpO2 Height Weight  12/27/20 2133 (!) 168/102 98 F (36.7 C) Oral 70 20 97 % -- --  12/27/20 2030 (!) 161/106 -- -- 65 (!) 25 96 % -- --  12/27/20 1954 (!) 169/114 97.6 F (36.4 C) Oral 65 (!) 22 97 % -- --  12/27/20 1516 (!) 165/129 98.2 F (36.8 C) Oral 72 20 97 % -- --  12/27/20 1509 -- -- -- -- -- -- 5\' 11"  (1.803 m) 83.9 kg    9:27 PM Reevaluation with update and discussion. After initial assessment and treatment, an updated evaluation reveals HE IS COMFORTABLE, mild hypertension present.  A few unifocal PVCs on monitor at this time.  No additional complaints.  Findings discussed and questions answered. Daleen Bo   Medical Decision Making:  This patient is presenting for evaluation of chronic chest pain and shortness of breath with PVCs, which does require a range of treatment options, and is a complaint that involves a moderate risk of morbidity and mortality. The differential diagnoses include ACS, pneumonia, COPD exacerbation. I decided to review old records, and in summary millage male with complicated cardiac history, including STEMI, NSTEMI, cardiogenic shock and persistent PVCs.  He continues to smoke tobacco.  I did not require additional historical information from anyone.  Clinical Laboratory Tests Ordered, included CBC, Metabolic panel, and BMP . Review indicates normal except BP high, total bilirubin high. Radiologic Tests Ordered, included chest x-ray.  I independently Visualized: Radiographic images, which show no infiltrate or edema  Cardiac Monitor Tracing which shows normal sinus rhythm     Critical Interventions-clinical evaluation, laboratory testing, radiography, observation, medication treatment, reassessment  After These Interventions, the Patient was reevaluated and was found stable for  discharge. Patient with concern for cardiac compromise, with reassuring evaluation.  No evidence of ACS, PE or pneumonia or significant COPD exacerbation requiring hospitalization or ongoing aggressive management.  He has apparently not been using his Spiriva and has been using someone else's albuterol inhaler.  Continues to smoke.  No indication for hospitalization at this time.   CRITICAL CARE-no Performed by: Daleen Bo  Nursing Notes Reviewed/ Care Coordinated Applicable Imaging Reviewed Interpretation of Laboratory Data incorporated into ED treatment  The patient appears reasonably screened and/or stabilized for discharge and I doubt any other medical condition or other [facility] requiring further screening, evaluation, or treatment in the ED at this time prior to discharge.  Plan: Home Medications-continue usual including Spiriva, discharged with albuterol inhaler to use every 3 to 4 hours and as needed; Home Treatments-stop smoking; return here if the recommended treatment, does not improve the symptoms; Recommended follow up-PCP and pulmonary follow-up as soon as possible.     Final Clinical Impression(s) / ED Diagnoses Final diagnoses:  Nonspecific chest pain  Chronic obstructive pulmonary disease, unspecified COPD type ([place])  PVC (premature ventricular contraction)  Tobacco abuse    Rx / DC Orders ED Discharge Orders     None        Daleen Bo, MD 12/28/20 0105

## 2020-12-27 NOTE — ED Triage Notes (Signed)
Pt c/o chest pain and SOB that have been going on since January, but reports last night it got so bad that it scared him enough to come get it checked out.

## 2021-03-09 ENCOUNTER — Emergency Department (HOSPITAL_COMMUNITY): Payer: BC Managed Care – PPO

## 2021-03-09 ENCOUNTER — Observation Stay (HOSPITAL_COMMUNITY)
Admission: EM | Admit: 2021-03-09 | Discharge: 2021-03-10 | Disposition: A | Discharge status: Home / Self Care | Payer: BC Managed Care – PPO | Attending: Family Medicine | Admitting: Family Medicine

## 2021-03-09 ENCOUNTER — Observation Stay (HOSPITAL_BASED_OUTPATIENT_CLINIC_OR_DEPARTMENT_OTHER): Payer: BC Managed Care – PPO

## 2021-03-09 ENCOUNTER — Other Ambulatory Visit: Payer: Self-pay

## 2021-03-09 DIAGNOSIS — I25110 Atherosclerotic heart disease of native coronary artery with unstable angina pectoris: Secondary | ICD-10-CM

## 2021-03-09 DIAGNOSIS — R0609 Other forms of dyspnea: Secondary | ICD-10-CM

## 2021-03-09 DIAGNOSIS — Z20822 Contact with and (suspected) exposure to covid-19: Secondary | ICD-10-CM | POA: Insufficient documentation

## 2021-03-09 DIAGNOSIS — I2510 Atherosclerotic heart disease of native coronary artery without angina pectoris: Secondary | ICD-10-CM | POA: Diagnosis not present

## 2021-03-09 DIAGNOSIS — J9691 Respiratory failure, unspecified with hypoxia: Secondary | ICD-10-CM | POA: Diagnosis present

## 2021-03-09 DIAGNOSIS — J441 Chronic obstructive pulmonary disease with (acute) exacerbation: Secondary | ICD-10-CM | POA: Diagnosis not present

## 2021-03-09 DIAGNOSIS — J9601 Acute respiratory failure with hypoxia: Principal | ICD-10-CM | POA: Insufficient documentation

## 2021-03-09 DIAGNOSIS — R0602 Shortness of breath: Secondary | ICD-10-CM | POA: Diagnosis present

## 2021-03-09 DIAGNOSIS — J449 Chronic obstructive pulmonary disease, unspecified: Secondary | ICD-10-CM

## 2021-03-09 DIAGNOSIS — I5042 Chronic combined systolic (congestive) and diastolic (congestive) heart failure: Secondary | ICD-10-CM | POA: Insufficient documentation

## 2021-03-09 DIAGNOSIS — R0603 Acute respiratory distress: Secondary | ICD-10-CM

## 2021-03-09 DIAGNOSIS — F1020 Alcohol dependence, uncomplicated: Secondary | ICD-10-CM | POA: Diagnosis present

## 2021-03-09 DIAGNOSIS — Z789 Other specified health status: Secondary | ICD-10-CM | POA: Diagnosis present

## 2021-03-09 LAB — I-STAT CHEM 8, ED
BUN: 13 mg/dL (ref 6–20)
Calcium, Ion: 1.17 mmol/L (ref 1.15–1.40)
Chloride: 103 mmol/L (ref 98–111)
Creatinine, Ser: 1 mg/dL (ref 0.61–1.24)
Glucose, Bld: 201 mg/dL — ABNORMAL HIGH (ref 70–99)
HCT: 49 % (ref 39.0–52.0)
Hemoglobin: 16.7 g/dL (ref 13.0–17.0)
Potassium: 3.6 mmol/L (ref 3.5–5.1)
Sodium: 143 mmol/L (ref 135–145)
TCO2: 30 mmol/L (ref 22–32)

## 2021-03-09 LAB — CBC WITH DIFFERENTIAL/PLATELET
Abs Immature Granulocytes: 0.02 10*3/uL (ref 0.00–0.07)
Basophils Absolute: 0.1 10*3/uL (ref 0.0–0.1)
Basophils Relative: 1 %
Eosinophils Absolute: 0.2 10*3/uL (ref 0.0–0.5)
Eosinophils Relative: 2 %
HCT: 47.1 % (ref 39.0–52.0)
Hemoglobin: 16.2 g/dL (ref 13.0–17.0)
Immature Granulocytes: 0 %
Lymphocytes Relative: 33 %
Lymphs Abs: 3.7 10*3/uL (ref 0.7–4.0)
MCH: 36.1 pg — ABNORMAL HIGH (ref 26.0–34.0)
MCHC: 34.4 g/dL (ref 30.0–36.0)
MCV: 104.9 fL — ABNORMAL HIGH (ref 80.0–100.0)
Monocytes Absolute: 1.1 10*3/uL — ABNORMAL HIGH (ref 0.1–1.0)
Monocytes Relative: 10 %
Neutro Abs: 6.1 10*3/uL (ref 1.7–7.7)
Neutrophils Relative %: 54 %
Platelets: 300 10*3/uL (ref 150–400)
RBC: 4.49 MIL/uL (ref 4.22–5.81)
RDW: 13 % (ref 11.5–15.5)
WBC: 11.1 10*3/uL — ABNORMAL HIGH (ref 4.0–10.5)
nRBC: 0 % (ref 0.0–0.2)

## 2021-03-09 LAB — COMPREHENSIVE METABOLIC PANEL
ALT: 18 U/L (ref 0–44)
AST: 28 U/L (ref 15–41)
Albumin: 4.1 g/dL (ref 3.5–5.0)
Alkaline Phosphatase: 63 U/L (ref 38–126)
Anion gap: 11 (ref 5–15)
BUN: 13 mg/dL (ref 6–20)
CO2: 26 mmol/L (ref 22–32)
Calcium: 9 mg/dL (ref 8.9–10.3)
Chloride: 104 mmol/L (ref 98–111)
Creatinine, Ser: 0.94 mg/dL (ref 0.61–1.24)
GFR, Estimated: >60 mL/min (ref >60)
Glucose, Bld: 205 mg/dL — ABNORMAL HIGH (ref 70–99)
Potassium: 3.6 mmol/L (ref 3.5–5.1)
Sodium: 141 mmol/L (ref 135–145)
Total Bilirubin: 0.4 mg/dL (ref 0.3–1.2)
Total Protein: 7.8 g/dL (ref 6.5–8.1)

## 2021-03-09 LAB — RESP PANEL BY RT-PCR (FLU A&B, COVID) ARPGX2
Influenza A by PCR: NEGATIVE
Influenza B by PCR: NEGATIVE
SARS Coronavirus 2 by RT PCR: NEGATIVE

## 2021-03-09 LAB — TROPONIN I (HIGH SENSITIVITY)
Troponin I (High Sensitivity): 10 ng/L (ref <18)
Troponin I (High Sensitivity): 43 ng/L — ABNORMAL HIGH (ref <18)
Troponin I (High Sensitivity): 32 ng/L — ABNORMAL HIGH (ref <18)
Troponin I (High Sensitivity): 26 ng/L — ABNORMAL HIGH (ref <18)

## 2021-03-09 LAB — ECHOCARDIOGRAM COMPLETE
Area-P 1/2: 3.17 cm2
Calc EF: 33.3 %
Height: 71 in
S' Lateral: 6.5 cm
Single Plane A2C EF: 11.5 %
Single Plane A4C EF: 43.8 %
Weight: 2959.46 oz

## 2021-03-09 LAB — GLUCOSE, CAPILLARY: Glucose-Capillary: 163 mg/dL — ABNORMAL HIGH (ref 70–99)

## 2021-03-09 LAB — BRAIN NATRIURETIC PEPTIDE: B Natriuretic Peptide: 898 pg/mL — ABNORMAL HIGH (ref 0.0–100.0)

## 2021-03-09 LAB — MAGNESIUM: Magnesium: 1.8 mg/dL (ref 1.7–2.4)

## 2021-03-09 LAB — LIPASE, BLOOD: Lipase: 30 U/L (ref 11–51)

## 2021-03-09 LAB — PROTIME-INR
INR: 1 (ref 0.8–1.2)
Prothrombin Time: 12.8 seconds (ref 11.4–15.2)

## 2021-03-09 LAB — CBG MONITORING, ED: Glucose-Capillary: 200 mg/dL — ABNORMAL HIGH (ref 70–99)

## 2021-03-09 MED ORDER — ASPIRIN 81 MG PO CHEW
324.0000 mg | CHEWABLE_TABLET | Freq: Once | ORAL | Status: AC
  Administered 2021-03-09: 08:00:00 324 mg via ORAL
  Filled 2021-03-09: qty 4

## 2021-03-09 MED ORDER — ASPIRIN EC 81 MG PO TBEC
81.0000 mg | DELAYED_RELEASE_TABLET | Freq: Every day | ORAL | Status: DC
  Administered 2021-03-10: 81 mg via ORAL
  Filled 2021-03-09: qty 1

## 2021-03-09 MED ORDER — HEPARIN SODIUM (PORCINE) 5000 UNIT/ML IJ SOLN
5000.0000 [IU] | Freq: Three times a day (TID) | INTRAMUSCULAR | Status: DC
  Administered 2021-03-09 – 2021-03-10 (×2): 5000 [IU] via SUBCUTANEOUS
  Filled 2021-03-09 (×2): qty 1

## 2021-03-09 MED ORDER — ONDANSETRON HCL 4 MG PO TABS: 4.0000 mg | ORAL_TABLET | Freq: Four times a day (QID) | ORAL | Status: DC | PRN

## 2021-03-09 MED ORDER — BISACODYL 10 MG RE SUPP: 10.0000 mg | Freq: Every day | RECTAL | Status: DC | PRN

## 2021-03-09 MED ORDER — GUAIFENESIN ER 600 MG PO TB12
600.0000 mg | ORAL_TABLET | Freq: Two times a day (BID) | ORAL | Status: DC
  Administered 2021-03-09 – 2021-03-10 (×2): 600 mg via ORAL
  Filled 2021-03-09 (×2): qty 1

## 2021-03-09 MED ORDER — PANTOPRAZOLE SODIUM 40 MG PO TBEC
40.0000 mg | DELAYED_RELEASE_TABLET | Freq: Two times a day (BID) | ORAL | Status: DC
  Administered 2021-03-09 – 2021-03-10 (×2): 40 mg via ORAL
  Filled 2021-03-09 (×2): qty 1

## 2021-03-09 MED ORDER — INSULIN ASPART 100 UNIT/ML IJ SOLN
0.0000 [IU] | Freq: Three times a day (TID) | INTRAMUSCULAR | Status: DC
  Administered 2021-03-10 (×2): 1 [IU] via SUBCUTANEOUS

## 2021-03-09 MED ORDER — FUROSEMIDE 10 MG/ML IJ SOLN: 20.0000 mg | Freq: Two times a day (BID) | INTRAMUSCULAR | Status: DC

## 2021-03-09 MED ORDER — TRAZODONE HCL 50 MG PO TABS
100.0000 mg | ORAL_TABLET | Freq: Every day | ORAL | Status: DC
  Administered 2021-03-09: 23:00:00 100 mg via ORAL
  Filled 2021-03-09: qty 2

## 2021-03-09 MED ORDER — SODIUM CHLORIDE 0.9% FLUSH
3.0000 mL | Freq: Two times a day (BID) | INTRAVENOUS | Status: DC
  Administered 2021-03-09 – 2021-03-10 (×2): 3 mL via INTRAVENOUS

## 2021-03-09 MED ORDER — TICAGRELOR 90 MG PO TABS
90.0000 mg | ORAL_TABLET | Freq: Two times a day (BID) | ORAL | Status: DC
  Administered 2021-03-09 – 2021-03-10 (×2): 90 mg via ORAL
  Filled 2021-03-09 (×3): qty 1

## 2021-03-09 MED ORDER — UMECLIDINIUM BROMIDE 62.5 MCG/ACT IN AEPB
1.0000 | INHALATION_SPRAY | Freq: Every day | RESPIRATORY_TRACT | Status: DC
  Administered 2021-03-10: 1 via RESPIRATORY_TRACT
  Filled 2021-03-09: qty 7

## 2021-03-09 MED ORDER — SODIUM CHLORIDE 0.9 % IV SOLN: 250.0000 mL | INTRAVENOUS | Status: DC | PRN

## 2021-03-09 MED ORDER — AZITHROMYCIN 250 MG PO TABS
500.0000 mg | ORAL_TABLET | Freq: Every day | ORAL | Status: DC
  Administered 2021-03-09 – 2021-03-10 (×2): 500 mg via ORAL
  Filled 2021-03-09 (×2): qty 2

## 2021-03-09 MED ORDER — ALBUTEROL SULFATE (2.5 MG/3ML) 0.083% IN NEBU: 2.5000 mg | INHALATION_SOLUTION | Freq: Once | RESPIRATORY_TRACT | Status: AC

## 2021-03-09 MED ORDER — IPRATROPIUM-ALBUTEROL 0.5-2.5 (3) MG/3ML IN SOLN
3.0000 mL | Freq: Four times a day (QID) | RESPIRATORY_TRACT | Status: DC
  Administered 2021-03-09 – 2021-03-10 (×4): 3 mL via RESPIRATORY_TRACT
  Filled 2021-03-09 (×4): qty 3

## 2021-03-09 MED ORDER — SODIUM CHLORIDE 0.9% FLUSH
3.0000 mL | Freq: Two times a day (BID) | INTRAVENOUS | Status: DC
  Administered 2021-03-09: 23:00:00 3 mL via INTRAVENOUS

## 2021-03-09 MED ORDER — ALBUTEROL SULFATE HFA 108 (90 BASE) MCG/ACT IN AERS
2.0000 | INHALATION_SPRAY | RESPIRATORY_TRACT | Status: DC | PRN
  Filled 2021-03-09: qty 6.7

## 2021-03-09 MED ORDER — ACETAMINOPHEN 650 MG RE SUPP: 650.0000 mg | Freq: Four times a day (QID) | RECTAL | Status: DC | PRN

## 2021-03-09 MED ORDER — ONDANSETRON HCL 4 MG/2ML IJ SOLN: 4.0000 mg | Freq: Four times a day (QID) | INTRAMUSCULAR | Status: DC | PRN

## 2021-03-09 MED ORDER — ACETAMINOPHEN 325 MG PO TABS: 650.0000 mg | ORAL_TABLET | Freq: Four times a day (QID) | ORAL | Status: DC | PRN

## 2021-03-09 MED ORDER — POLYETHYLENE GLYCOL 3350 17 G PO PACK
17.0000 g | PACK | Freq: Every day | ORAL | Status: DC | PRN
  Filled 2021-03-09: qty 1

## 2021-03-09 MED ORDER — HYDRALAZINE HCL 20 MG/ML IJ SOLN: 10.0000 mg | Freq: Four times a day (QID) | INTRAMUSCULAR | Status: DC | PRN

## 2021-03-09 MED ORDER — FUROSEMIDE 10 MG/ML IJ SOLN
20.0000 mg | Freq: Two times a day (BID) | INTRAMUSCULAR | Status: DC
  Administered 2021-03-09 – 2021-03-10 (×2): 20 mg via INTRAVENOUS
  Filled 2021-03-09 (×2): qty 2

## 2021-03-09 MED ORDER — LOSARTAN POTASSIUM 50 MG PO TABS
100.0000 mg | ORAL_TABLET | Freq: Every day | ORAL | Status: DC
  Administered 2021-03-10: 100 mg via ORAL
  Filled 2021-03-09: qty 2

## 2021-03-09 MED ORDER — ALBUTEROL SULFATE (2.5 MG/3ML) 0.083% IN NEBU: 2.5000 mg | INHALATION_SOLUTION | RESPIRATORY_TRACT | Status: DC | PRN

## 2021-03-09 MED ORDER — INSULIN ASPART 100 UNIT/ML IJ SOLN: 0.0000 [IU] | Freq: Every day | INTRAMUSCULAR | Status: DC

## 2021-03-09 MED ORDER — METOPROLOL TARTRATE 50 MG PO TABS
150.0000 mg | ORAL_TABLET | Freq: Two times a day (BID) | ORAL | Status: DC
  Administered 2021-03-09 – 2021-03-10 (×2): 150 mg via ORAL
  Filled 2021-03-09 (×2): qty 3

## 2021-03-09 MED ORDER — ATORVASTATIN CALCIUM 40 MG PO TABS
80.0000 mg | ORAL_TABLET | Freq: Every day | ORAL | Status: DC
  Administered 2021-03-10: 80 mg via ORAL
  Filled 2021-03-09 (×2): qty 2

## 2021-03-09 MED ORDER — AMLODIPINE BESYLATE 5 MG PO TABS
10.0000 mg | ORAL_TABLET | Freq: Every day | ORAL | Status: DC
  Administered 2021-03-09: 12:00:00 10 mg via ORAL
  Filled 2021-03-09 (×2): qty 2

## 2021-03-09 MED ORDER — IOHEXOL 350 MG/ML SOLN
100.0000 mL | Freq: Once | INTRAVENOUS | Status: AC | PRN
  Administered 2021-03-09: 09:00:00 100 mL via INTRAVENOUS

## 2021-03-09 MED ORDER — FUROSEMIDE 10 MG/ML IJ SOLN
20.0000 mg | Freq: Once | INTRAMUSCULAR | Status: AC
  Administered 2021-03-09: 11:00:00 20 mg via INTRAVENOUS
  Filled 2021-03-09: qty 2

## 2021-03-09 MED ORDER — METHYLPREDNISOLONE SODIUM SUCC 40 MG IJ SOLR
40.0000 mg | Freq: Two times a day (BID) | INTRAMUSCULAR | Status: DC
  Administered 2021-03-09 – 2021-03-10 (×3): 40 mg via INTRAVENOUS
  Filled 2021-03-09 (×3): qty 1

## 2021-03-09 MED ORDER — NITROGLYCERIN 0.4 MG SL SUBL: 0.4000 mg | SUBLINGUAL_TABLET | SUBLINGUAL | Status: DC | PRN

## 2021-03-09 MED ORDER — SODIUM CHLORIDE 0.9% FLUSH: 3.0000 mL | INTRAVENOUS | Status: DC | PRN

## 2021-03-09 MED ORDER — ALBUTEROL SULFATE (2.5 MG/3ML) 0.083% IN NEBU
INHALATION_SOLUTION | RESPIRATORY_TRACT | Status: AC
  Administered 2021-03-09: 08:00:00 2.5 mg via RESPIRATORY_TRACT
  Filled 2021-03-09: qty 3

## 2021-03-09 MED ORDER — CYCLOBENZAPRINE HCL 10 MG PO TABS
10.0000 mg | ORAL_TABLET | Freq: Every day | ORAL | Status: DC
  Administered 2021-03-09: 23:00:00 10 mg via ORAL
  Filled 2021-03-09: qty 1

## 2021-03-09 MED ORDER — NICOTINE 21 MG/24HR TD PT24
21.0000 mg | MEDICATED_PATCH | Freq: Every day | TRANSDERMAL | Status: DC
  Administered 2021-03-09 – 2021-03-10 (×2): 21 mg via TRANSDERMAL
  Filled 2021-03-09 (×2): qty 1

## 2021-03-09 NOTE — Progress Notes (Signed)
Patient ambulating in the hall, ambulated approx. 1000 ft. No c/o SOB, O2 sats on room air 97%.

## 2021-03-09 NOTE — Progress Notes (Signed)
*  PRELIMINARY RESULTS* Echocardiogram 2D Echocardiogram has been performed.  Timothy Walker 03/09/2021, 1:57 PM

## 2021-03-09 NOTE — ED Provider Notes (Signed)
[facility] EMERGENCY DEPARTMENT Provider Note   CSN: [id number] Arrival date & time: 03/09/21  2334     History Chief Complaint  Patient presents with   Shortness of Breath    Timothy [facility] is a 55 y.o. male.  HPI Patient with multiple medical problems presents via EMS with concern of dyspnea, chest pain.  He notes that yesterday about 18 hours ago he had a more full sensation than usual after dinner, but was generally okay until this morning when about 2 hours ago he developed difficulty breathing, chest and abdominal pain.  EMS reports the patient was hypoxic, 88% on room air, hypertensive 356 systolic and anxious.  Patient describes abdominal fullness, soreness, 12 out of 10 discomfort, no medication taken for relief, no clear relieving with anything.  He states that he has taken his blood pressure medication today.  He continues to smoke cigarettes.    Per cardiology note from earlier this year: "History of STEMI 12/2012 with DES to RCA.  He presented in cardiogenic shock with VT/VF arrest multiple defibrillation and IV amiodarone, following PCI required balloon pump and pressors with temporary transvenous pacing.  Echo EF 45 to 50%, inferior hypokinesis, normal diastolic function."    Past Medical History:  Diagnosis Date   COPD (chronic obstructive pulmonary disease) ([place])    Coronary artery disease    a. 12/2012 Inf STEMI/Cath/PCI: LM nl, LAD nl, D1 50ost, D2 min irregs, D3 small, LCX 80-13m, OM1/2/3 min irregs, RI 20p, RCA 50p/100d (3.5x18 Xience DEs),PDA/PL nl, EF 86% - complicated by VF/CGS/IABP/VDRF   GERD (gastroesophageal reflux disease)    Hiatal hernia    Hypertension    Ischemic cardiomyopathy    a. 12/2012 Echo: EF 45-50%, basal inf, inflat, mid inf HK, mildly reduced RV fxn.   Marijuana abuse    MI (myocardial infarction) ([place]) 2014   Obstructive sleep apnea    Tobacco abuse     Patient Active Problem List   Diagnosis Date Noted   Respiratory failure with  hypoxia ([place]) 03/09/2021   Frequent PVCs 10/09/2019   NSTEMI (non-ST elevated myocardial infarction) ([place]) 10/07/2019   Ventricular tachycardia 10/07/2019   Wrist fracture, closed 10/09/2018   Unstable angina ([place]) 09/25/2018   COPD (chronic obstructive pulmonary disease) ([place])    Essential hypertension    Hyperlipidemia with target LDL less than 70    Alcohol use    Palpitations    Anal condyloma 01/21/2018   Bloating 01/21/2018   GERD (gastroesophageal reflux disease) 01/14/2018   Esophageal dysphagia 01/14/2018   Hemorrhoids 10/01/2015   Mucosal abnormality of esophagus    Hepatomegaly 04/24/2014   Dysphagia, pharyngoesophageal phase 04/24/2014   Hematochezia 04/24/2014   Ventricular fibrillation ([place]) 01/17/2013   Coronary artery disease involving native coronary artery of native heart with unstable angina pectoris ([place]) 01/17/2013   Hypotension 01/17/2013   ST elevation myocardial infarction (STEMI) of inferior wall, initial episode of care ([place]) 01/16/2013   Acute and chronic respiratory failure 01/13/2013   Tobacco abuse 01/13/2013   Heart disease 01/13/2013   Cardiogenic shock ([place]) 01/13/2013    Past Surgical History:  Procedure Laterality Date   COLONOSCOPY WITH PROPOFOL N/A 05/14/2014   Dr. Gala Romney: anal canal hemorrhoid, rectosigmoid hyerpplastic polyp, right-sided divetriculosis    CORONARY ANGIOPLASTY  01/13/2013   STENT TO RCA BY DR Burt Knack   CORONARY STENT INTERVENTION N/A 10/07/2019   Procedure: CORONARY STENT INTERVENTION;  Surgeon: Leonie Man, MD;  Location: [place] [facility];  Service: Cardiovascular;  Laterality: N/A;   CORONARY/GRAFT ACUTE MI REVASCULARIZATION N/A 10/07/2019   Procedure: Coronary/Graft Acute MI Revascularization;  Surgeon: Leonie Man, MD;  Location: [facility];  Service: Cardiovascular;  Laterality: N/A;   DENTAL SURGERY     ESOPHAGEAL DILATION N/A 05/14/2014   Procedure: ESOPHAGEAL DILATION [place];  Surgeon:  Daneil Dolin, MD;  Location: [facility];  Service: Endoscopy;  Laterality: N/A;   ESOPHAGOGASTRODUODENOSCOPY (EGD) WITH PROPOFOL N/A 05/14/2014   Dr. Gala Romney: empiric dilation, abnormal esophagus s/p biopsy (normal), small hiatal hernia   LEFT HEART CATH AND CORONARY ANGIOGRAPHY N/A 09/26/2018   Procedure: LEFT HEART CATH AND CORONARY ANGIOGRAPHY;  Surgeon: Lorretta Harp, MD;  Location: [place] [facility];  Service: Cardiovascular;  Laterality: N/A;   LEFT HEART CATH AND CORONARY ANGIOGRAPHY N/A 10/07/2019   Procedure: LEFT HEART CATH AND CORONARY ANGIOGRAPHY;  Surgeon: Leonie Man, MD;  Location: [place] [facility];  Service: Cardiovascular;  Laterality: N/A;   LEFT HEART CATHETERIZATION WITH CORONARY ANGIOGRAM N/A 01/13/2013   Procedure: LEFT HEART CATHETERIZATION WITH CORONARY ANGIOGRAM;  Surgeon: Wellington Hampshire, MD;  Location: [place] CATH LAB;  Service: Cardiovascular;  Laterality: N/A;   NASAL SEPTUM SURGERY     PERCUTANEOUS CORONARY STENT INTERVENTION (PCI-S)  01/13/2013   Procedure: PERCUTANEOUS CORONARY STENT INTERVENTION (PCI-S);  Surgeon: Wellington Hampshire, MD;  Location: [facility] CATH LAB;  Service: Cardiovascular;;   POLYPECTOMY  05/14/2014   Procedure: POLYPECTOMY;  Surgeon: Daneil Dolin, MD;  Location: [facility];  Service: Endoscopy;;       Family History  Problem Relation Age of Onset   Heart attack Other    Hypertension Other    Colon cancer Neg Hx     Social History   Tobacco Use   Smoking status: Every Day    Packs/day: 0.75    Years: 31.00    Pack years: 23.25    Types: Cigarettes    Start date: 11/21/1981   Smokeless tobacco: Never  Vaping Use   Vaping Use: Some days   Substances: Nicotine, CBD  Substance Use Topics   Alcohol use: Yes    Alcohol/week: 2.0 standard drinks    Types: 1 Cans of beer, 1 Shots of liquor per week    Comment: 3-5 shots of liquor daily   Drug use: Yes    Types: Marijuana    Comment: last night    Home Medications Prior to Admission  medications   Medication Sig Start Date End Date Taking? Authorizing Provider  diltiazem (CARDIZEM) 30 MG tablet Take 1 tablet (30 mg total) by mouth 2 (two) times daily. 10/28/20  Yes Arnoldo Lenis, MD  losartan (COZAAR) 100 MG tablet TAKE 1 TABLET BY MOUTH EVERY DAY 11/01/20  Yes Branch, Alphonse Guild, MD  metoprolol tartrate (LOPRESSOR) 100 MG tablet TAKE 1 TABLET BY MOUTH TWICE A DAY Patient taking differently: Take 100 mg by mouth 2 (two) times daily. Take 100 mg twice daily in addition to 50 mg twice daily for a total of 150 mg twice daily 11/01/20  Yes Branch, Alphonse Guild, MD  metoprolol tartrate (LOPRESSOR) 50 MG tablet TAKE 1 TABLET (50MG )TWICE DAILY IN ADDITION TO 100MG  TABLET FOR TOTAL OF 150MG  TWICE DAILY. Patient taking differently: Take 50 mg by mouth 2 (two) times daily. Take 50 mg twice daily in addition to 100 mg tablet for total of 150 mg tablet for a total of 150 mg twice daily 11/01/20  Yes Branch, Alphonse Guild, MD  pantoprazole (PROTONIX) 40 MG tablet Take by mouth.   Yes [provider]  amLODipine (NORVASC) 2.5 MG tablet Take by mouth.    [provider]  aspirin 81 MG tablet Take 81 mg by mouth daily.     [provider]  atenolol (TENORMIN) 100 MG tablet     [provider]  atorvastatin (LIPITOR) 80 MG tablet TAKE 1 TABLET BY MOUTH DAILY AT 6 PM. 11/01/20   Arnoldo Lenis, MD  BRILINTA 90 MG TABS tablet TAKE 1 TABLET BY MOUTH TWICE A DAY 11/04/20   Arnoldo Lenis, MD  cyclobenzaprine (FLEXERIL) 10 MG tablet Take 1 tablet (10 mg total) by mouth at bedtime. 10/28/19   Wurst, [place], PA-C  losartan (COZAAR) 100 MG tablet Take by mouth. 12/10/19   [provider]  metoprolol-hydrochlorothiazide (LOPRESSOR HCT) 100-50 MG tablet Take by mouth.    [provider]  nitroGLYCERIN (NITROSTAT) 0.4 MG SL tablet Place 1 tablet (0.4 mg total) under the tongue every 5 (five) minutes as needed for chest pain. 10/01/18   Arnoldo Lenis, MD   pantoprazole (PROTONIX) 40 MG tablet TAKE 1 TABLET BY MOUTH TWICE A DAY 11/01/20   Arnoldo Lenis, MD  potassium chloride (KLOR-CON) 10 MEQ tablet Take 2 tablets (20 mEq total) by mouth 2 (two) times daily for 10 days. 10/20/20 10/30/20  Evalee Jefferson, PA-C  umeclidinium bromide (INCRUSE ELLIPTA) 62.5 MCG/INH AEPB Inhale 1 puff into the lungs daily. 10/28/20   Arnoldo Lenis, MD    Allergies    Patient has no known allergies.  Review of Systems   Review of Systems  Constitutional:        Per HPI, otherwise negative  HENT:         Per HPI, otherwise negative  Respiratory:         Per HPI, otherwise negative  Cardiovascular:        Per HPI, otherwise negative  Gastrointestinal:  Negative for vomiting.  Endocrine:       Negative aside from HPI  Genitourinary:        Neg aside from HPI   Musculoskeletal:        Per HPI, otherwise negative  Skin: Negative.   Neurological:  Negative for syncope.   Physical Exam Updated Vital Signs BP (!) 162/110 (BP Location: Left Arm)    Pulse 72    Temp 98.2 F (36.8 C) (Oral)    Resp 18    Ht 5\' 11"  (1.803 m)    Wt 83.9 kg    SpO2 98%    BMI 25.80 kg/m   Physical Exam Vitals and nursing note reviewed.  Constitutional:      Appearance: He is well-developed. He is ill-appearing and diaphoretic.  HENT:     Head: Normocephalic and atraumatic.  Eyes:     Conjunctiva/sclera: Conjunctivae normal.  Cardiovascular:     Rate and Rhythm: Regular rhythm. Tachycardia present.  Pulmonary:     Effort: Tachypnea and accessory muscle usage present.     Breath sounds: Decreased breath sounds and wheezing present.  Chest:     Comments: Protuberant, but not distended, nontender abdomen. Abdominal:     General: There is no distension.  Skin:    General: Skin is warm.     Coloration: Skin is pale.  Neurological:     Mental Status: He is alert and oriented to person, place, and time.  Psychiatric:        Mood and Affect:  Mood is anxious.    ED  Results / Procedures / Treatments   Labs (all labs ordered are listed, but only abnormal results are displayed) Labs Reviewed  COMPREHENSIVE METABOLIC PANEL - Abnormal; Notable for the following components:      Result Value   Glucose, Bld 205 (*)    All other components within normal limits  BRAIN NATRIURETIC PEPTIDE - Abnormal; Notable for the following components:   B Natriuretic Peptide 898.0 (*)    All other components within normal limits  CBC WITH DIFFERENTIAL/PLATELET - Abnormal; Notable for the following components:   WBC 11.1 (*)    MCV 104.9 (*)    MCH 36.1 (*)    Monocytes Absolute 1.1 (*)    All other components within normal limits  CBG MONITORING, ED - Abnormal; Notable for the following components:   Glucose-Capillary 200 (*)    All other components within normal limits  I-STAT CHEM 8, ED - Abnormal; Notable for the following components:   Glucose, Bld 201 (*)    All other components within normal limits  TROPONIN I (HIGH SENSITIVITY) - Abnormal; Notable for the following components:   Troponin I (High Sensitivity) 43 (*)    All other components within normal limits  RESP PANEL BY RT-PCR (FLU A&B, COVID) ARPGX2  MAGNESIUM  PROTIME-INR  LIPASE, BLOOD  TROPONIN I (HIGH SENSITIVITY)    EKG EKG Interpretation  Date/Time:  Wednesday March 09 2021 08:08:22 EST Ventricular Rate:  87 PR Interval:  163 QRS Duration: 132 QT Interval:  409 QTC Calculation: 493 R Axis:   74 Text Interpretation: Sinus rhythm Consider right atrial enlargement Nonspecific intraventricular conduction delay Artifact Abnormal ECG Confirmed by Carmin Muskrat [phone]) on 03/09/2021 8:57:00 AM  Radiology DG Chest Portable 1 View  Result Date: 03/09/2021 CLINICAL DATA:  Chest pain, shortness of breath chest pain that started this morning on the way to work. EXAM: PORTABLE CHEST 1 VIEW COMPARISON:  December 27, 2020. FINDINGS: EKG leads project over the chest. Trachea midline.  Cardiomediastinal contours and hilar structures are stable. Increased interstitial markings are noted throughout the chest, a change from previous imaging. No lobar consolidative process. Subtle patchy opacities at the lung bases, for instance in the LEFT mid chest. No pneumothorax. On limited assessment there is no acute skeletal process. IMPRESSION: Increased interstitial markings throughout the chest, a change from previous imaging. Findings could represent atypical infection or edema. Electronically Signed   By: Zetta Bills M.D.   On: 03/09/2021 08:13   ECHOCARDIOGRAM COMPLETE  Result Date: 03/09/2021    ECHOCARDIOGRAM REPORT   Patient Name:   Timothy [facility] Date of Exam: 03/09/2021 Medical Rec #:  [id number]      Height:       71.0 in Accession #:    [id number]     Weight:       185.0 lb Date of Birth:  07-Jul-1965      BSA:          2.040 m Patient Age:    60 years       BP:           162/110 mmHg Patient Gender: M              HR:           68 bpm. Exam Location:  [facility] Procedure: 2D Echo, Cardiac Doppler and Color Doppler Indications:    Dyspnea R06.00  History:        Patient has  prior history of Echocardiogram examinations, most                 recent 10/08/2019. Previous Myocardial Infarction and CAD, COPD,                 Arrythmias:PVC; Risk Factors:Hypertension and Dyslipidemia.                 Alcohol use, Cardiogenic shock ([place]), NSTEMI (non-ST elevated                 myocardial infarction) ([place]), ST elevation myocardial infarction                 (STEMI) of inferior wall, initial episode of care [facility]),                 Ischemic cardiomyopathy (From Hx), Obstructive sleep apnea (From                 Hx).  Sonographer:    Alvino Chapel RCS Referring Phys: [id number] COURAGE EMOKPAE IMPRESSIONS  1. Left ventricular ejection fraction, by estimation, is 30 to 35%. The left ventricle has moderately decreased function. The left ventricle demonstrates regional wall motion abnormalities (see scoring  diagram/findings for description). The basal-to-mid  inferior wall is akinetic. The rest of the LV segments are hypokinetic. The left ventricular internal cavity size was moderately dilated. There is mild concentric left ventricular hypertrophy. Left ventricular diastolic parameters are consistent with Grade II diastolic dysfunction (pseudonormalization).  2. Right ventricular systolic function is normal. The right ventricular size is normal. Tricuspid regurgitation signal is inadequate for assessing PA pressure.  3. Left atrial size was severely dilated.  4. The mitral valve is normal in structure. Mild mitral valve regurgitation.  5. The aortic valve is tricuspid. There is mild calcification of the aortic valve. There is mild thickening of the aortic valve. Aortic valve regurgitation is not visualized. Aortic valve sclerosis/calcification is present, without any evidence of aortic stenosis.  6. Aortic dilatation noted. There is mild dilatation of the aortic root, measuring 38 mm.  7. The inferior vena cava is normal in size with greater than 50% respiratory variability, suggesting right atrial pressure of 3 mmHg. Comparison(s): Compared to prior TTE in 2020, the LVEF has dropped from about 45% to 35%. FINDINGS  Left Ventricle: Left ventricular ejection fraction, by estimation, is 30 to 35%. The left ventricle has moderately decreased function. The left ventricle demonstrates regional wall motion abnormalities. The basal-to-mid inferior wall appears akinetic. The rest of the LV segments appear hypokinetic. The left ventricular internal cavity size was moderately dilated. There is mild concentric left ventricular hypertrophy. Left ventricular diastolic parameters are consistent with Grade II diastolic dysfunction (pseudonormalization). Right Ventricle: The right ventricular size is normal. No increase in right ventricular wall thickness. Right ventricular systolic function is normal. Tricuspid regurgitation signal  is inadequate for assessing PA pressure. Left Atrium: Left atrial size was severely dilated. Right Atrium: Right atrial size was normal in size. Pericardium: There is no evidence of pericardial effusion. Mitral Valve: The mitral valve is normal in structure. Mild mitral valve regurgitation. Tricuspid Valve: The tricuspid valve is normal in structure. Tricuspid valve regurgitation is trivial. Aortic Valve: The aortic valve is tricuspid. There is mild calcification of the aortic valve. There is mild thickening of the aortic valve. Aortic valve regurgitation is not visualized. Aortic valve sclerosis/calcification is present, without any evidence of aortic stenosis. Pulmonic Valve: The pulmonic valve was normal in structure. Pulmonic valve regurgitation is  trivial. Aorta: Aortic dilatation noted. There is mild dilatation of the aortic root, measuring 38 mm. Venous: The inferior vena cava is normal in size with greater than 50% respiratory variability, suggesting right atrial pressure of 3 mmHg. IAS/Shunts: No atrial level shunt detected by color flow Doppler.  LEFT VENTRICLE PLAX 2D LVIDd:         6.80 cm      Diastology LVIDs:         6.50 cm      LV e' medial:    5.87 cm/s LV PW:         0.90 cm      LV E/e' medial:  11.6 LV IVS:        1.50 cm      LV e' lateral:   9.14 cm/s LVOT diam:     2.30 cm      LV E/e' lateral: 7.4 LV SV:         74 LV SV Index:   36 LVOT Area:     4.15 cm  LV Volumes (MOD) LV vol d, MOD A2C: 243.0 ml LV vol d, MOD A4C: 233.0 ml LV vol s, MOD A2C: 215.0 ml LV vol s, MOD A4C: 131.0 ml LV SV MOD A2C:     28.0 ml LV SV MOD A4C:     233.0 ml LV SV MOD BP:      84.0 ml RIGHT VENTRICLE RV S prime:     10.20 cm/s TAPSE (M-mode): 1.9 cm LEFT ATRIUM              Index        RIGHT ATRIUM           Index LA diam:        3.80 cm  1.86 cm/m   RA Area:     19.70 cm LA Vol (A2C):   117.0 ml 57.36 ml/m  RA Volume:   54.90 ml  26.92 ml/m LA Vol (A4C):   85.8 ml  42.07 ml/m LA Biplane Vol: 100.0 ml  49.03 ml/m  AORTIC VALVE LVOT Vmax:   82.10 cm/s LVOT Vmean:  61.800 cm/s LVOT VTI:    0.177 m  AORTA Ao Root diam: 3.80 cm MITRAL VALVE MV Area (PHT): 3.17 cm    SHUNTS MV Decel Time: 239 msec    Systemic VTI:  0.18 m MV E velocity: 67.80 cm/s  Systemic Diam: 2.30 cm MV A velocity: 55.80 cm/s MV E/A ratio:  1.22 Gwyndolyn Kaufman MD Electronically signed by Gwyndolyn Kaufman MD Signature Date/Time: 03/09/2021/2:40:23 PM    Final    CT Angio Chest/Abd/Pel for Dissection W and/or Wo Contrast  Result Date: 03/09/2021 CLINICAL DATA:  A 55 year old male presents with chest pain, back pain and suspected aortic dissection. EXAM: CT ANGIOGRAPHY CHEST, ABDOMEN AND PELVIS TECHNIQUE: Non-contrast CT of the chest was initially obtained. Multidetector CT imaging through the chest, abdomen and pelvis was performed using the standard protocol during bolus administration of intravenous contrast. Multiplanar reconstructed images and MIPs were obtained and reviewed to evaluate the vascular anatomy. CONTRAST:  131mL OMNIPAQUE IOHEXOL 350 MG/ML SOLN COMPARISON:  Chest x-ray of March 09, 2021. FINDINGS: CTA CHEST FINDINGS Cardiovascular: Noncontrast imaging of the chest with calcified atheromatous changes and signs of coronary artery disease, three-vessel disease. No signs of intramural hematoma of the thoracic aorta. Thoracic aorta with noncalcified plaque as well. 3.6 cm caliber of the ascending thoracic aorta. No acute aortic process in the chest. Moderate cardiac enlargement  without substantial pericardial effusion. Central pulmonary vasculature normal caliber. Mediastinum/Nodes: Scattered small lymph nodes throughout the chest none with pathologic enlargement largest in the range of 11 mm. Mild thickening of the esophagus is diffuse. No adjacent stranding. Lungs/Pleura: Sub solid nodule in the RIGHT upper lobe 14 x 16 mm (image 53/7) Sub solid nodular opacity in the RIGHT upper lobe (image 38/7) 9 mm. Mild bronchial wall  thickening throughout the chest. Septal thickening more pronounced at the lung bases. Ground-glass attenuation with geographic pattern more pronounced in the RIGHT lower lobe. Discrete ground-glass nodule in the RIGHT lower lobe (image 83/7) 9 x 7 mm. Small RIGHT lower lobe pulmonary nodule (image 83/7) 6 mm. Small ground-glass nodule in the LEFT upper lobe (image 70/7) 8 mm. Moderate paraseptal predominant pulmonary emphysema that is worse at the lung apices. Very small bilateral pleural effusions. Musculoskeletal: No acute bone finding. No destructive bone process. Spinal degenerative changes. Review of the MIP images confirms the above findings. CTA ABDOMEN AND PELVIS FINDINGS VASCULAR Aorta: Calcified and noncalcified atheromatous plaque in the abdominal aorta. No signs of adjacent stranding or dissection. No aneurysmal dilation. Celiac: Celiac with classic anatomy. No signs of dissection, vasculitis or dilation. SMA: Patent superior mesenteric artery, widely patent with minimal atherosclerotic changes. No acute process related to the SMA. Renals: Single renal artery with early bifurcation on the RIGHT. No signs of narrowing or acute process. Minimal atherosclerotic changes. Accessory renal artery to the lower pole of the LEFT kidney. Renal vasculature on the LEFT is patent. No signs of acute process. IMA: IMA is patent without acute finding. Inflow: Patent without evidence of aneurysm, dissection, vasculitis or significant stenosis. Veins: Venous structures with normal caliber, limited assessment on the arterial phase. Proximal outflow: Runoff into the proximal thigh without signs of substantial narrowing or dilation. Contrast passes into vessels in the upper thigh. Review of the MIP images confirms the above findings. NON-VASCULAR Hepatobiliary: Limited assessment of the liver without acute process or visible lesion. Pancreas: Normal, without mass, inflammation or ductal dilatation. Spleen: Spleen normal size  and contour. Adrenals/Urinary Tract: Adrenal glands are unremarkable. Symmetric renal enhancement. No sign of hydronephrosis. No suspicious renal lesion or perinephric stranding. Urinary bladder is grossly unremarkable. Stomach/Bowel: No acute gastrointestinal findings. The appendix is normal. Sigmoid diverticulosis. Lymphatic: There is no gastrohepatic or hepatoduodenal ligament lymphadenopathy. No retroperitoneal or mesenteric lymphadenopathy. No pelvic sidewall lymphadenopathy. Reproductive: Unremarkable by CT. Other: No ascites.  No free air. Musculoskeletal: No acute bone finding. No destructive bone process. Spinal degenerative changes. Review of the MIP images confirms the above findings. IMPRESSION: No signs of acute aortic process. Septal thickening and small bilateral pleural effusions with ground-glass favored to represent sequela of heart failure or volume overload. Given patchy appearance of ground-glass in some areas would consider the possibility of superimposed viral or atypical process. Bilateral areas of sub solid nodularity and a single solid RIGHT lower lobe pulmonary nodule. In the current context would consider a 3 month follow-up to assess for signs of resolution or persistence with additional follow-up to be predicated on the most suspicious remaining finding. Cardiomegaly and signs of coronary artery disease, three-vessel disease. Mild thickening of the esophagus, correlate with signs of esophagitis. Pulmonary emphysema. Aortic atherosclerosis. Aortic Atherosclerosis (ICD10-I70.0) and Emphysema (ICD10-J43.9). Electronically Signed   By: Zetta Bills M.D.   On: 03/09/2021 09:27    Procedures Procedures   Medications Ordered in ED Medications  methylPREDNISolone sodium succinate (SOLU-MEDROL) 40 mg/mL injection 40 mg (40 mg Intravenous Given  03/09/21 1212)  ipratropium-albuterol (DUONEB) 0.5-2.5 (3) MG/3ML nebulizer solution 3 mL (3 mLs Nebulization Given 03/09/21 1112)  albuterol  (PROVENTIL) (2.5 MG/3ML) 0.083% nebulizer solution 2.5 mg (has no administration in time range)  amLODipine (NORVASC) tablet 10 mg (10 mg Oral Given 03/09/21 1213)  hydrALAZINE (APRESOLINE) injection 10 mg (has no administration in time range)  furosemide (LASIX) injection 20 mg (has no administration in time range)  aspirin chewable tablet 324 mg (324 mg Oral Given 03/09/21 0806)  albuterol (PROVENTIL) (2.5 MG/3ML) 0.083% nebulizer solution 2.5 mg (2.5 mg Nebulization Given 03/09/21 0800)  iohexol (OMNIPAQUE) 350 MG/ML injection 100 mL (100 mLs Intravenous Contrast Given 03/09/21 0838)  furosemide (LASIX) injection 20 mg (20 mg Intravenous Given 03/09/21 1105)    ED Course  I have reviewed the triage vital signs and the nursing notes.  Pertinent labs & imaging results that were available during my care of the patient were reviewed by me and considered in my medical decision making (see chart for details). Cardiac 90s sinus normal Pulse ox 95% with nasal cannula abnormal  Update: Patient markedly improved, now on nasal cannula.  However, given concern for respiratory distress on arrival, fluid overload status, new oxygen requirement, patient required IV diuresis, admission.  Patient's case discussed with our internal medicine colleague for admission. MDM Rules/Calculators/A&P MDM Number of Diagnoses or Management Options Respiratory distress: new, needed workup   Amount and/or Complexity of Data Reviewed Clinical lab tests: ordered and reviewed Tests in the radiology section of CPT: ordered and reviewed Tests in the medicine section of CPT: reviewed and ordered Decide to obtain previous medical records or to obtain history from someone other than the patient: yes Obtain history from someone other than the patient: yes Review and summarize past medical records: yes Discuss the patient with other providers: yes Independent visualization of images, tracings, or specimens: yes  Risk of  Complications, Morbidity, and/or Mortality Presenting problems: high Diagnostic procedures: high Management options: high  Critical Care Total time providing critical care: 30-74 minutes (45)  Patient Progress Patient progress: improved   Final Clinical Impression(s) / ED Diagnoses Final diagnoses:  Respiratory distress     Carmin Muskrat, MD 03/09/21 1544

## 2021-03-09 NOTE — H&P (Addendum)
Patient Demographics:    Timothy Walker, is a 55 y.o. male  MRN: [id number]   DOB - 1966-02-07  Admit Date - 03/09/2021  Outpatient Primary MD for the patient is Patient, [name] Per (Inactive)   Assessment & Plan:    Principal Problem:   Respiratory failure with hypoxia ([place]) Active Problems:   Coronary artery disease involving native coronary artery of native heart with unstable angina pectoris (HCC)   COPD (chronic obstructive pulmonary disease) (HCC)   Alcohol use  Admitted for hypertensive crisis with flash pulmonary edema--resulting in acute hypoxic respiratory failure Requiring IV Lasix and aggressive BP control  1) hypertensive crisis leading to CHF/pulmonary edema/prolonged with bilateral pleural effusion --BP was 200/140 mmhg at home -Patient initially presented with dyspnea, hypoxia -After improved BP control and aggressive diuresis oxygenation improved significantly -Amlodipine, metoprolol, losartan, IV Lasix and IV hydralazine as prescribed  2) acute hypoxic respiratory failure--- on admission O2 sat was 88% on room air 83% with ambulation -After aggressive BP control on IV diuresis hypoxia pretty much improved and subsequently resolved  3) acute COPD exacerbation--- smoking cessation advised, IV Solu-Medrol, azithromycin bronchodilators and mucolytics as ordered  4)H/o CAD--history of prior MI with prior angioplasty and stenting -No ACS Symptoms at this time -Continue aspirin, Brilinta, Lipitor and metoprolol as ordered Troponin 10 >>43--suspect demand ischemia in the setting of hypertensive crisis with flash pulmonary edema -EKG sinus rhythm without acute ST changes  5)HFrEF--patient with combined systolic and diastolic dysfunction CHF --Echo from 03/09/2021 shows grade 2 diastolic dysfunction  and EF is 30 to 35% (EF is down from about 45% back in July 2021) left ventricle has moderately decreased function. The left ventricle  demonstrates regional wall motion abnormalities (see scoring  diagram/findings for description). The basal-to-mid  inferior wall is akinetic. The rest of the LV segments are hypokinetic --BNP was elevated at 898  -IV Lasix ordered  Disposition/Need for in-Hospital Stay- patient unable to be discharged at this time due to --hypertensive crisis with flash pulmonary edema--resulting in acute hypoxic respiratory failure Requiring IV Lasix and aggressive BP control     Dispo: The patient is from: Home              Anticipated d/c is to: Home              Anticipated d/c date is: 2 days              Patient currently is not medically stable to d/c. Barriers: Not Clinically Stable-    With History of - Reviewed by me  Past Medical History:  Diagnosis Date   COPD (chronic obstructive pulmonary disease) (HCC)    Coronary artery disease    a. 12/2012 Inf STEMI/Cath/PCI: LM nl, LAD nl, D1 50ost, D2 min irregs, D3 small, LCX 80-46m, OM1/2/3 min irregs, RI 20p, RCA 50p/100d (3.5x18 Xience DEs),PDA/PL nl, EF 76% - complicated by VF/CGS/IABP/VDRF   GERD (gastroesophageal reflux disease)  Hiatal hernia    Hypertension    Ischemic cardiomyopathy    a. 12/2012 Echo: EF 45-50%, basal inf, inflat, mid inf HK, mildly reduced RV fxn.   Marijuana abuse    MI (myocardial infarction) ([place]) 2014   Obstructive sleep apnea    Tobacco abuse       Past Surgical History:  Procedure Laterality Date   COLONOSCOPY WITH PROPOFOL N/A 05/14/2014   Dr. Gala Romney: anal canal hemorrhoid, rectosigmoid hyerpplastic polyp, right-sided divetriculosis    CORONARY ANGIOPLASTY  01/13/2013   STENT TO RCA BY DR Burt Knack   CORONARY STENT INTERVENTION N/A 10/07/2019   Procedure: CORONARY STENT INTERVENTION;  Surgeon: Leonie Man, MD;  Location: [facility];  Service: Cardiovascular;   Laterality: N/A;   CORONARY/GRAFT ACUTE MI REVASCULARIZATION N/A 10/07/2019   Procedure: Coronary/Graft Acute MI Revascularization;  Surgeon: Leonie Man, MD;  Location: [facility];  Service: Cardiovascular;  Laterality: N/A;   DENTAL SURGERY     ESOPHAGEAL DILATION N/A 05/14/2014   Procedure: ESOPHAGEAL DILATION [place];  Surgeon: Daneil Dolin, MD;  Location: [facility];  Service: Endoscopy;  Laterality: N/A;   ESOPHAGOGASTRODUODENOSCOPY (EGD) WITH PROPOFOL N/A 05/14/2014   Dr. Gala Romney: empiric dilation, abnormal esophagus s/p biopsy (normal), small hiatal hernia   LEFT HEART CATH AND CORONARY ANGIOGRAPHY N/A 09/26/2018   Procedure: LEFT HEART CATH AND CORONARY ANGIOGRAPHY;  Surgeon: Lorretta Harp, MD;  Location: [facility];  Service: Cardiovascular;  Laterality: N/A;   LEFT HEART CATH AND CORONARY ANGIOGRAPHY N/A 10/07/2019   Procedure: LEFT HEART CATH AND CORONARY ANGIOGRAPHY;  Surgeon: Leonie Man, MD;  Location: [facility];  Service: Cardiovascular;  Laterality: N/A;   LEFT HEART CATHETERIZATION WITH CORONARY ANGIOGRAM N/A 01/13/2013   Procedure: LEFT HEART CATHETERIZATION WITH CORONARY ANGIOGRAM;  Surgeon: Wellington Hampshire, MD;  Location: [facility];  Service: Cardiovascular;  Laterality: N/A;   NASAL SEPTUM SURGERY     PERCUTANEOUS CORONARY STENT INTERVENTION (PCI-S)  01/13/2013   Procedure: PERCUTANEOUS CORONARY STENT INTERVENTION (PCI-S);  Surgeon: Wellington Hampshire, MD;  Location: [facility];  Service: Cardiovascular;;   POLYPECTOMY  05/14/2014   Procedure: POLYPECTOMY;  Surgeon: Daneil Dolin, MD;  Location: [facility];  Service: Endoscopy;;      Chief Complaint  Patient presents with   Shortness of Breath      HPI:    Timothy Walker  is a 55 y.o. male smoker with past medical history relevant for CAD with prior angioplasty and stenting of prior MI, COPD, HTN, who presents to the ED with worsening dyspnea and is found to be hypoxic in the  ED -on admission O2 sat was 88% on room air 83% with ambulation in the setting of very elevated BP -BP was 200/140 mmhg at home -After aggressive BP control on IV diuresis hypoxia pretty much improved and subsequently resolved -Patient denies frank chest pain, -In the ED CTA chest consistent with volume overload/pulmonary edema heart failure, with some bronchitic/COPD type findings as well -BNP was elevated at 898  -Troponin 10 >> 43, -EKG sinus rhythm without acute ST changes--- patient remains chest pain-free -Patient reports chronic cough due to ongoing tobacco use -COVID and flu negative -WBC is 11.1 hemoglobin 16.2 -Creatinine 0.94,  LFTs are not elevated   Review of systems:    In addition to the HPI above,   A full Review of  Systems was done, all other systems reviewed are negative except  as noted above in HPI , .    Social History:  Reviewed by me    Social History   Tobacco Use   Smoking status: Every Day    Packs/day: 0.75    Years: 31.00    Pack years: 23.25    Types: Cigarettes    Start date: 11/21/1981   Smokeless tobacco: Never  Substance Use Topics   Alcohol use: Yes    Alcohol/week: 2.0 standard drinks    Types: 1 Cans of beer, 1 Shots of liquor per week    Comment: 3-5 shots of liquor daily       Family History :  Reviewed by me    Family History  Problem Relation Age of Onset   Heart attack Other    Hypertension Other    Colon cancer Neg Hx      Home Medications:   Prior to Admission medications   Medication Sig Start Date End Date Taking? Authorizing Provider  diltiazem (CARDIZEM) 30 MG tablet Take 1 tablet (30 mg total) by mouth 2 (two) times daily. 10/28/20  Yes Arnoldo Lenis, MD  losartan (COZAAR) 100 MG tablet TAKE 1 TABLET BY MOUTH EVERY DAY 11/01/20  Yes Branch, Alphonse Guild, MD  metoprolol tartrate (LOPRESSOR) 100 MG tablet TAKE 1 TABLET BY MOUTH TWICE A DAY Patient taking differently: Take 100 mg by mouth 2 (two) times daily. Take  100 mg twice daily in addition to 50 mg twice daily for a total of 150 mg twice daily 11/01/20  Yes Branch, Alphonse Guild, MD  metoprolol tartrate (LOPRESSOR) 50 MG tablet TAKE 1 TABLET (50MG )TWICE DAILY IN ADDITION TO 100MG  TABLET FOR TOTAL OF 150MG  TWICE DAILY. Patient taking differently: Take 50 mg by mouth 2 (two) times daily. Take 50 mg twice daily in addition to 100 mg tablet for total of 150 mg tablet for a total of 150 mg twice daily 11/01/20  Yes Branch, Alphonse Guild, MD  pantoprazole (PROTONIX) 40 MG tablet Take by mouth.   Yes [provider]  amLODipine (NORVASC) 2.5 MG tablet Take by mouth.    [provider]  aspirin 81 MG tablet Take 81 mg by mouth daily.     [provider]  atenolol (TENORMIN) 100 MG tablet     [provider]  atorvastatin (LIPITOR) 80 MG tablet TAKE 1 TABLET BY MOUTH DAILY AT 6 PM. 11/01/20   Arnoldo Lenis, MD  BRILINTA 90 MG TABS tablet TAKE 1 TABLET BY MOUTH TWICE A DAY 11/04/20   Arnoldo Lenis, MD  cyclobenzaprine (FLEXERIL) 10 MG tablet Take 1 tablet (10 mg total) by mouth at bedtime. 10/28/19   Wurst, [place], PA-C  losartan (COZAAR) 100 MG tablet Take by mouth. 12/10/19   [provider]  metoprolol-hydrochlorothiazide (LOPRESSOR HCT) 100-50 MG tablet Take by mouth.    [provider]  nitroGLYCERIN (NITROSTAT) 0.4 MG SL tablet Place 1 tablet (0.4 mg total) under the tongue every 5 (five) minutes as needed for chest pain. 10/01/18   Arnoldo Lenis, MD  pantoprazole (PROTONIX) 40 MG tablet TAKE 1 TABLET BY MOUTH TWICE A DAY 11/01/20   Arnoldo Lenis, MD  potassium chloride (KLOR-CON) 10 MEQ tablet Take 2 tablets (20 mEq total) by mouth 2 (two) times daily for 10 days. 10/20/20 10/30/20  Evalee Jefferson, PA-C  umeclidinium bromide (INCRUSE ELLIPTA) 62.5 MCG/INH AEPB Inhale 1 puff into the lungs daily. 10/28/20   Arnoldo Lenis, MD     Allergies:  No Known Allergies   Physical Exam:   Vitals  Blood  pressure (!) 144/95, pulse 79, temperature 98 F (36.7 C), temperature source Oral, resp. rate 17, height 5\' 11"  (1.803 m), weight 83.9 kg, SpO2 97 %.  Physical Examination: General appearance - alert, dyspnea on exertion conversational dyspnea initially  mental status - alert, oriented to person, place, and time,  Eyes - sclera anicteric Neck - supple, no JVD elevation , Chest diminished breath sounds with bibasilar rales  heart - S1 and S2 normal, regular  Abdomen - soft, nontender, nondistended, no masses or organomegaly Neurological - screening mental status exam normal, neck supple without rigidity, cranial nerves II through XII intact, DTR's normal and symmetric Extremities - no pedal edema noted, intact peripheral pulses  Skin - warm, dry     Data Review:    CBC Recent Labs  Lab 03/09/21 0808 03/09/21 0811  WBC  --  11.1*  HGB 16.7 16.2  HCT 49.0 47.1  PLT  --  300  MCV  --  104.9*  MCH  --  36.1*  MCHC  --  34.4  RDW  --  13.0  LYMPHSABS  --  3.7  MONOABS  --  1.1*  EOSABS  --  0.2  BASOSABS  --  0.1   ------------------------------------------------------------------------------------------------------------------  Chemistries  Recent Labs  Lab 03/09/21 0808 03/09/21 0811  NA 143 141  K 3.6 3.6  CL 103 104  CO2  --  26  GLUCOSE 201* 205*  BUN 13 13  CREATININE 1.00 0.94  CALCIUM  --  9.0  MG  --  1.8  AST  --  28  ALT  --  18  ALKPHOS  --  63  BILITOT  --  0.4   ------------------------------------------------------------------------------------------------------------------ estimated creatinine clearance is 95.7 mL/min (by C-G formula based on SCr of 0.94 mg/dL). ------------------------------------------------------------------------------------------------------------------ No results for input(s): TSH, T4TOTAL, T3FREE, THYROIDAB in the last 72 hours.  Invalid input(s): FREET3   Coagulation profile Recent Labs  Lab 03/09/21 0811  INR  1.0   ------------------------------------------------------------------------------------------------------------------- No results for input(s): DDIMER in the last 72 hours. -------------------------------------------------------------------------------------------------------------------  Cardiac Enzymes No results for input(s): CKMB, TROPONINI, MYOGLOBIN in the last 168 hours.  Invalid input(s): CK ------------------------------------------------------------------------------------------------------------------    Component Value Date/Time   BNP 898.0 (H) 03/09/2021 2130     ---------------------------------------------------------------------------------------------------------------  Urinalysis No results found for: COLORURINE, APPEARANCEUR, LABSPEC, PHURINE, GLUCOSEU, HGBUR, BILIRUBINUR, KETONESUR, PROTEINUR, UROBILINOGEN, NITRITE, LEUKOCYTESUR  ----------------------------------------------------------------------------------------------------------------   Imaging Results:    DG Chest Portable 1 View  Result Date: 03/09/2021 CLINICAL DATA:  Chest pain, shortness of breath chest pain that started this morning on the way to work. EXAM: PORTABLE CHEST 1 VIEW COMPARISON:  December 27, 2020. FINDINGS: EKG leads project over the chest. Trachea midline. Cardiomediastinal contours and hilar structures are stable. Increased interstitial markings are noted throughout the chest, a change from previous imaging. No lobar consolidative process. Subtle patchy opacities at the lung bases, for instance in the LEFT mid chest. No pneumothorax. On limited assessment there is no acute skeletal process. IMPRESSION: Increased interstitial markings throughout the chest, a change from previous imaging. Findings could represent atypical infection or edema. Electronically Signed   By: Zetta Bills M.D.   On: 03/09/2021 08:13   ECHOCARDIOGRAM COMPLETE  Result Date: 03/09/2021    ECHOCARDIOGRAM REPORT    Patient Name:   Timothy Walker Date of Exam: 03/09/2021 Medical Rec #:  [id number]      Height:  71.0 in Accession #:    [id number]     Weight:       185.0 lb Date of Birth:  1966-01-15      BSA:          2.040 m Patient Age:    25 years       BP:           162/110 mmHg Patient Gender: M              HR:           68 bpm. Exam Location:  [facility] Procedure: 2D Echo, Cardiac Doppler and Color Doppler Indications:    Dyspnea R06.00  History:        Patient has prior history of Echocardiogram examinations, most                 recent 10/08/2019. Previous Myocardial Infarction and CAD, COPD,                 Arrythmias:PVC; Risk Factors:Hypertension and Dyslipidemia.                 Alcohol use, Cardiogenic shock ([place]), NSTEMI (non-ST elevated                 myocardial infarction) ([place]), ST elevation myocardial infarction                 (STEMI) of inferior wall, initial episode of care [facility]),                 Ischemic cardiomyopathy (From Hx), Obstructive sleep apnea (From                 Hx).  Sonographer:    Alvino Chapel RCS Referring Phys: [phone] Airiana Elman IMPRESSIONS  1. Left ventricular ejection fraction, by estimation, is 30 to 35%. The left ventricle has moderately decreased function. The left ventricle demonstrates regional wall motion abnormalities (see scoring diagram/findings for description). The basal-to-mid  inferior wall is akinetic. The rest of the LV segments are hypokinetic. The left ventricular internal cavity size was moderately dilated. There is mild concentric left ventricular hypertrophy. Left ventricular diastolic parameters are consistent with Grade II diastolic dysfunction (pseudonormalization).  2. Right ventricular systolic function is normal. The right ventricular size is normal. Tricuspid regurgitation signal is inadequate for assessing PA pressure.  3. Left atrial size was severely dilated.  4. The mitral valve is normal in structure. Mild mitral valve regurgitation.  5. The  aortic valve is tricuspid. There is mild calcification of the aortic valve. There is mild thickening of the aortic valve. Aortic valve regurgitation is not visualized. Aortic valve sclerosis/calcification is present, without any evidence of aortic stenosis.  6. Aortic dilatation noted. There is mild dilatation of the aortic root, measuring 38 mm.  7. The inferior vena cava is normal in size with greater than 50% respiratory variability, suggesting right atrial pressure of 3 mmHg. Comparison(s): Compared to prior TTE in 2020, the LVEF has dropped from about 45% to 35%. FINDINGS  Left Ventricle: Left ventricular ejection fraction, by estimation, is 30 to 35%. The left ventricle has moderately decreased function. The left ventricle demonstrates regional wall motion abnormalities. The basal-to-mid inferior wall appears akinetic. The rest of the LV segments appear hypokinetic. The left ventricular internal cavity size was moderately dilated. There is mild concentric left ventricular hypertrophy. Left ventricular diastolic parameters are consistent with Grade II diastolic dysfunction (pseudonormalization). Right Ventricle: The right ventricular size is normal.  No increase in right ventricular wall thickness. Right ventricular systolic function is normal. Tricuspid regurgitation signal is inadequate for assessing PA pressure. Left Atrium: Left atrial size was severely dilated. Right Atrium: Right atrial size was normal in size. Pericardium: There is no evidence of pericardial effusion. Mitral Valve: The mitral valve is normal in structure. Mild mitral valve regurgitation. Tricuspid Valve: The tricuspid valve is normal in structure. Tricuspid valve regurgitation is trivial. Aortic Valve: The aortic valve is tricuspid. There is mild calcification of the aortic valve. There is mild thickening of the aortic valve. Aortic valve regurgitation is not visualized. Aortic valve sclerosis/calcification is present, without any  evidence of aortic stenosis. Pulmonic Valve: The pulmonic valve was normal in structure. Pulmonic valve regurgitation is trivial. Aorta: Aortic dilatation noted. There is mild dilatation of the aortic root, measuring 38 mm. Venous: The inferior vena cava is normal in size with greater than 50% respiratory variability, suggesting right atrial pressure of 3 mmHg. IAS/Shunts: No atrial level shunt detected by color flow Doppler.  LEFT VENTRICLE PLAX 2D LVIDd:         6.80 cm      Diastology LVIDs:         6.50 cm      LV e' medial:    5.87 cm/s LV PW:         0.90 cm      LV E/e' medial:  11.6 LV IVS:        1.50 cm      LV e' lateral:   9.14 cm/s LVOT diam:     2.30 cm      LV E/e' lateral: 7.4 LV SV:         74 LV SV Index:   36 LVOT Area:     4.15 cm  LV Volumes (MOD) LV vol d, MOD A2C: 243.0 ml LV vol d, MOD A4C: 233.0 ml LV vol s, MOD A2C: 215.0 ml LV vol s, MOD A4C: 131.0 ml LV SV MOD A2C:     28.0 ml LV SV MOD A4C:     233.0 ml LV SV MOD BP:      84.0 ml RIGHT VENTRICLE RV S prime:     10.20 cm/s TAPSE (M-mode): 1.9 cm LEFT ATRIUM              Index        RIGHT ATRIUM           Index LA diam:        3.80 cm  1.86 cm/m   RA Area:     19.70 cm LA Vol (A2C):   117.0 ml 57.36 ml/m  RA Volume:   54.90 ml  26.92 ml/m LA Vol (A4C):   85.8 ml  42.07 ml/m LA Biplane Vol: 100.0 ml 49.03 ml/m  AORTIC VALVE LVOT Vmax:   82.10 cm/s LVOT Vmean:  61.800 cm/s LVOT VTI:    0.177 m  AORTA Ao Root diam: 3.80 cm MITRAL VALVE MV Area (PHT): 3.17 cm    SHUNTS MV Decel Time: 239 msec    Systemic VTI:  0.18 m MV E velocity: 67.80 cm/s  Systemic Diam: 2.30 cm MV A velocity: 55.80 cm/s MV E/A ratio:  1.22 Gwyndolyn Kaufman MD Electronically signed by Gwyndolyn Kaufman MD Signature Date/Time: 03/09/2021/2:40:23 PM    Final    CT Angio Chest/Abd/Pel for Dissection W and/or Wo Contrast  Result Date: 03/09/2021 CLINICAL DATA:  A 55 year old male presents with chest pain, back pain  and suspected aortic dissection. EXAM: CT  ANGIOGRAPHY CHEST, ABDOMEN AND PELVIS TECHNIQUE: Non-contrast CT of the chest was initially obtained. Multidetector CT imaging through the chest, abdomen and pelvis was performed using the standard protocol during bolus administration of intravenous contrast. Multiplanar reconstructed images and MIPs were obtained and reviewed to evaluate the vascular anatomy. CONTRAST:  133mL OMNIPAQUE IOHEXOL 350 MG/ML SOLN COMPARISON:  Chest x-ray of March 09, 2021. FINDINGS: CTA CHEST FINDINGS Cardiovascular: Noncontrast imaging of the chest with calcified atheromatous changes and signs of coronary artery disease, three-vessel disease. No signs of intramural hematoma of the thoracic aorta. Thoracic aorta with noncalcified plaque as well. 3.6 cm caliber of the ascending thoracic aorta. No acute aortic process in the chest. Moderate cardiac enlargement without substantial pericardial effusion. Central pulmonary vasculature normal caliber. Mediastinum/Nodes: Scattered small lymph nodes throughout the chest none with pathologic enlargement largest in the range of 11 mm. Mild thickening of the esophagus is diffuse. No adjacent stranding. Lungs/Pleura: Sub solid nodule in the RIGHT upper lobe 14 x 16 mm (image 53/7) Sub solid nodular opacity in the RIGHT upper lobe (image 38/7) 9 mm. Mild bronchial wall thickening throughout the chest. Septal thickening more pronounced at the lung bases. Ground-glass attenuation with geographic pattern more pronounced in the RIGHT lower lobe. Discrete ground-glass nodule in the RIGHT lower lobe (image 83/7) 9 x 7 mm. Small RIGHT lower lobe pulmonary nodule (image 83/7) 6 mm. Small ground-glass nodule in the LEFT upper lobe (image 70/7) 8 mm. Moderate paraseptal predominant pulmonary emphysema that is worse at the lung apices. Very small bilateral pleural effusions. Musculoskeletal: No acute bone finding. No destructive bone process. Spinal degenerative changes. Review of the MIP images confirms the  above findings. CTA ABDOMEN AND PELVIS FINDINGS VASCULAR Aorta: Calcified and noncalcified atheromatous plaque in the abdominal aorta. No signs of adjacent stranding or dissection. No aneurysmal dilation. Celiac: Celiac with classic anatomy. No signs of dissection, vasculitis or dilation. SMA: Patent superior mesenteric artery, widely patent with minimal atherosclerotic changes. No acute process related to the SMA. Renals: Single renal artery with early bifurcation on the RIGHT. No signs of narrowing or acute process. Minimal atherosclerotic changes. Accessory renal artery to the lower pole of the LEFT kidney. Renal vasculature on the LEFT is patent. No signs of acute process. IMA: IMA is patent without acute finding. Inflow: Patent without evidence of aneurysm, dissection, vasculitis or significant stenosis. Veins: Venous structures with normal caliber, limited assessment on the arterial phase. Proximal outflow: Runoff into the proximal thigh without signs of substantial narrowing or dilation. Contrast passes into vessels in the upper thigh. Review of the MIP images confirms the above findings. NON-VASCULAR Hepatobiliary: Limited assessment of the liver without acute process or visible lesion. Pancreas: Normal, without mass, inflammation or ductal dilatation. Spleen: Spleen normal size and contour. Adrenals/Urinary Tract: Adrenal glands are unremarkable. Symmetric renal enhancement. No sign of hydronephrosis. No suspicious renal lesion or perinephric stranding. Urinary bladder is grossly unremarkable. Stomach/Bowel: No acute gastrointestinal findings. The appendix is normal. Sigmoid diverticulosis. Lymphatic: There is no gastrohepatic or hepatoduodenal ligament lymphadenopathy. No retroperitoneal or mesenteric lymphadenopathy. No pelvic sidewall lymphadenopathy. Reproductive: Unremarkable by CT. Other: No ascites.  No free air. Musculoskeletal: No acute bone finding. No destructive bone process. Spinal degenerative  changes. Review of the MIP images confirms the above findings. IMPRESSION: No signs of acute aortic process. Septal thickening and small bilateral pleural effusions with ground-glass favored to represent sequela of heart failure or volume overload. Given patchy appearance of  ground-glass in some areas would consider the possibility of superimposed viral or atypical process. Bilateral areas of sub solid nodularity and a single solid RIGHT lower lobe pulmonary nodule. In the current context would consider a 3 month follow-up to assess for signs of resolution or persistence with additional follow-up to be predicated on the most suspicious remaining finding. Cardiomegaly and signs of coronary artery disease, three-vessel disease. Mild thickening of the esophagus, correlate with signs of esophagitis. Pulmonary emphysema. Aortic atherosclerosis. Aortic Atherosclerosis (ICD10-I70.0) and Emphysema (ICD10-J43.9). Electronically Signed   By: Zetta Bills M.D.   On: 03/09/2021 09:27    Radiological Exams on Admission: DG Chest Portable 1 View  Result Date: 03/09/2021 CLINICAL DATA:  Chest pain, shortness of breath chest pain that started this morning on the way to work. EXAM: PORTABLE CHEST 1 VIEW COMPARISON:  December 27, 2020. FINDINGS: EKG leads project over the chest. Trachea midline. Cardiomediastinal contours and hilar structures are stable. Increased interstitial markings are noted throughout the chest, a change from previous imaging. No lobar consolidative process. Subtle patchy opacities at the lung bases, for instance in the LEFT mid chest. No pneumothorax. On limited assessment there is no acute skeletal process. IMPRESSION: Increased interstitial markings throughout the chest, a change from previous imaging. Findings could represent atypical infection or edema. Electronically Signed   By: Zetta Bills M.D.   On: 03/09/2021 08:13   ECHOCARDIOGRAM COMPLETE  Result Date: 03/09/2021    ECHOCARDIOGRAM  REPORT   Patient Name:   Timothy Walker Date of Exam: 03/09/2021 Medical Rec #:  [id number]      Height:       71.0 in Accession #:    [id number]     Weight:       185.0 lb Date of Birth:  10-Nov-1965      BSA:          2.040 m Patient Age:    7 years       BP:           162/110 mmHg Patient Gender: M              HR:           68 bpm. Exam Location:  [facility] Procedure: 2D Echo, Cardiac Doppler and Color Doppler Indications:    Dyspnea R06.00  History:        Patient has prior history of Echocardiogram examinations, most                 recent 10/08/2019. Previous Myocardial Infarction and CAD, COPD,                 Arrythmias:PVC; Risk Factors:Hypertension and Dyslipidemia.                 Alcohol use, Cardiogenic shock ([place]), NSTEMI (non-ST elevated                 myocardial infarction) ([place]), ST elevation myocardial infarction                 (STEMI) of inferior wall, initial episode of care [facility]),                 Ischemic cardiomyopathy (From Hx), Obstructive sleep apnea (From                 Hx).  Sonographer:    Alvino Chapel RCS Referring Phys: [phone] Abagayle Klutts IMPRESSIONS  1. Left ventricular ejection fraction, by estimation, is 30 to 35%.  The left ventricle has moderately decreased function. The left ventricle demonstrates regional wall motion abnormalities (see scoring diagram/findings for description). The basal-to-mid  inferior wall is akinetic. The rest of the LV segments are hypokinetic. The left ventricular internal cavity size was moderately dilated. There is mild concentric left ventricular hypertrophy. Left ventricular diastolic parameters are consistent with Grade II diastolic dysfunction (pseudonormalization).  2. Right ventricular systolic function is normal. The right ventricular size is normal. Tricuspid regurgitation signal is inadequate for assessing PA pressure.  3. Left atrial size was severely dilated.  4. The mitral valve is normal in structure. Mild mitral valve regurgitation.   5. The aortic valve is tricuspid. There is mild calcification of the aortic valve. There is mild thickening of the aortic valve. Aortic valve regurgitation is not visualized. Aortic valve sclerosis/calcification is present, without any evidence of aortic stenosis.  6. Aortic dilatation noted. There is mild dilatation of the aortic root, measuring 38 mm.  7. The inferior vena cava is normal in size with greater than 50% respiratory variability, suggesting right atrial pressure of 3 mmHg. Comparison(s): Compared to prior TTE in 2020, the LVEF has dropped from about 45% to 35%. FINDINGS  Left Ventricle: Left ventricular ejection fraction, by estimation, is 30 to 35%. The left ventricle has moderately decreased function. The left ventricle demonstrates regional wall motion abnormalities. The basal-to-mid inferior wall appears akinetic. The rest of the LV segments appear hypokinetic. The left ventricular internal cavity size was moderately dilated. There is mild concentric left ventricular hypertrophy. Left ventricular diastolic parameters are consistent with Grade II diastolic dysfunction (pseudonormalization). Right Ventricle: The right ventricular size is normal. No increase in right ventricular wall thickness. Right ventricular systolic function is normal. Tricuspid regurgitation signal is inadequate for assessing PA pressure. Left Atrium: Left atrial size was severely dilated. Right Atrium: Right atrial size was normal in size. Pericardium: There is no evidence of pericardial effusion. Mitral Valve: The mitral valve is normal in structure. Mild mitral valve regurgitation. Tricuspid Valve: The tricuspid valve is normal in structure. Tricuspid valve regurgitation is trivial. Aortic Valve: The aortic valve is tricuspid. There is mild calcification of the aortic valve. There is mild thickening of the aortic valve. Aortic valve regurgitation is not visualized. Aortic valve sclerosis/calcification is present, without any  evidence of aortic stenosis. Pulmonic Valve: The pulmonic valve was normal in structure. Pulmonic valve regurgitation is trivial. Aorta: Aortic dilatation noted. There is mild dilatation of the aortic root, measuring 38 mm. Venous: The inferior vena cava is normal in size with greater than 50% respiratory variability, suggesting right atrial pressure of 3 mmHg. IAS/Shunts: No atrial level shunt detected by color flow Doppler.  LEFT VENTRICLE PLAX 2D LVIDd:         6.80 cm      Diastology LVIDs:         6.50 cm      LV e' medial:    5.87 cm/s LV PW:         0.90 cm      LV E/e' medial:  11.6 LV IVS:        1.50 cm      LV e' lateral:   9.14 cm/s LVOT diam:     2.30 cm      LV E/e' lateral: 7.4 LV SV:         74 LV SV Index:   36 LVOT Area:     4.15 cm  LV Volumes (MOD) LV vol d, MOD A2C:  243.0 ml LV vol d, MOD A4C: 233.0 ml LV vol s, MOD A2C: 215.0 ml LV vol s, MOD A4C: 131.0 ml LV SV MOD A2C:     28.0 ml LV SV MOD A4C:     233.0 ml LV SV MOD BP:      84.0 ml RIGHT VENTRICLE RV S prime:     10.20 cm/s TAPSE (M-mode): 1.9 cm LEFT ATRIUM              Index        RIGHT ATRIUM           Index LA diam:        3.80 cm  1.86 cm/m   RA Area:     19.70 cm LA Vol (A2C):   117.0 ml 57.36 ml/m  RA Volume:   54.90 ml  26.92 ml/m LA Vol (A4C):   85.8 ml  42.07 ml/m LA Biplane Vol: 100.0 ml 49.03 ml/m  AORTIC VALVE LVOT Vmax:   82.10 cm/s LVOT Vmean:  61.800 cm/s LVOT VTI:    0.177 m  AORTA Ao Root diam: 3.80 cm MITRAL VALVE MV Area (PHT): 3.17 cm    SHUNTS MV Decel Time: 239 msec    Systemic VTI:  0.18 m MV E velocity: 67.80 cm/s  Systemic Diam: 2.30 cm MV A velocity: 55.80 cm/s MV E/A ratio:  1.22 Gwyndolyn Kaufman MD Electronically signed by Gwyndolyn Kaufman MD Signature Date/Time: 03/09/2021/2:40:23 PM    Final    CT Angio Chest/Abd/Pel for Dissection W and/or Wo Contrast  Result Date: 03/09/2021 CLINICAL DATA:  A 55 year old male presents with chest pain, back pain and suspected aortic dissection. EXAM: CT  ANGIOGRAPHY CHEST, ABDOMEN AND PELVIS TECHNIQUE: Non-contrast CT of the chest was initially obtained. Multidetector CT imaging through the chest, abdomen and pelvis was performed using the standard protocol during bolus administration of intravenous contrast. Multiplanar reconstructed images and MIPs were obtained and reviewed to evaluate the vascular anatomy. CONTRAST:  134mL OMNIPAQUE IOHEXOL 350 MG/ML SOLN COMPARISON:  Chest x-ray of March 09, 2021. FINDINGS: CTA CHEST FINDINGS Cardiovascular: Noncontrast imaging of the chest with calcified atheromatous changes and signs of coronary artery disease, three-vessel disease. No signs of intramural hematoma of the thoracic aorta. Thoracic aorta with noncalcified plaque as well. 3.6 cm caliber of the ascending thoracic aorta. No acute aortic process in the chest. Moderate cardiac enlargement without substantial pericardial effusion. Central pulmonary vasculature normal caliber. Mediastinum/Nodes: Scattered small lymph nodes throughout the chest none with pathologic enlargement largest in the range of 11 mm. Mild thickening of the esophagus is diffuse. No adjacent stranding. Lungs/Pleura: Sub solid nodule in the RIGHT upper lobe 14 x 16 mm (image 53/7) Sub solid nodular opacity in the RIGHT upper lobe (image 38/7) 9 mm. Mild bronchial wall thickening throughout the chest. Septal thickening more pronounced at the lung bases. Ground-glass attenuation with geographic pattern more pronounced in the RIGHT lower lobe. Discrete ground-glass nodule in the RIGHT lower lobe (image 83/7) 9 x 7 mm. Small RIGHT lower lobe pulmonary nodule (image 83/7) 6 mm. Small ground-glass nodule in the LEFT upper lobe (image 70/7) 8 mm. Moderate paraseptal predominant pulmonary emphysema that is worse at the lung apices. Very small bilateral pleural effusions. Musculoskeletal: No acute bone finding. No destructive bone process. Spinal degenerative changes. Review of the MIP images confirms the  above findings. CTA ABDOMEN AND PELVIS FINDINGS VASCULAR Aorta: Calcified and noncalcified atheromatous plaque in the abdominal aorta. No signs of adjacent stranding or  dissection. No aneurysmal dilation. Celiac: Celiac with classic anatomy. No signs of dissection, vasculitis or dilation. SMA: Patent superior mesenteric artery, widely patent with minimal atherosclerotic changes. No acute process related to the SMA. Renals: Single renal artery with early bifurcation on the RIGHT. No signs of narrowing or acute process. Minimal atherosclerotic changes. Accessory renal artery to the lower pole of the LEFT kidney. Renal vasculature on the LEFT is patent. No signs of acute process. IMA: IMA is patent without acute finding. Inflow: Patent without evidence of aneurysm, dissection, vasculitis or significant stenosis. Veins: Venous structures with normal caliber, limited assessment on the arterial phase. Proximal outflow: Runoff into the proximal thigh without signs of substantial narrowing or dilation. Contrast passes into vessels in the upper thigh. Review of the MIP images confirms the above findings. NON-VASCULAR Hepatobiliary: Limited assessment of the liver without acute process or visible lesion. Pancreas: Normal, without mass, inflammation or ductal dilatation. Spleen: Spleen normal size and contour. Adrenals/Urinary Tract: Adrenal glands are unremarkable. Symmetric renal enhancement. No sign of hydronephrosis. No suspicious renal lesion or perinephric stranding. Urinary bladder is grossly unremarkable. Stomach/Bowel: No acute gastrointestinal findings. The appendix is normal. Sigmoid diverticulosis. Lymphatic: There is no gastrohepatic or hepatoduodenal ligament lymphadenopathy. No retroperitoneal or mesenteric lymphadenopathy. No pelvic sidewall lymphadenopathy. Reproductive: Unremarkable by CT. Other: No ascites.  No free air. Musculoskeletal: No acute bone finding. No destructive bone process. Spinal degenerative  changes. Review of the MIP images confirms the above findings. IMPRESSION: No signs of acute aortic process. Septal thickening and small bilateral pleural effusions with ground-glass favored to represent sequela of heart failure or volume overload. Given patchy appearance of ground-glass in some areas would consider the possibility of superimposed viral or atypical process. Bilateral areas of sub solid nodularity and a single solid RIGHT lower lobe pulmonary nodule. In the current context would consider a 3 month follow-up to assess for signs of resolution or persistence with additional follow-up to be predicated on the most suspicious remaining finding. Cardiomegaly and signs of coronary artery disease, three-vessel disease. Mild thickening of the esophagus, correlate with signs of esophagitis. Pulmonary emphysema. Aortic atherosclerosis. Aortic Atherosclerosis (ICD10-I70.0) and Emphysema (ICD10-J43.9). Electronically Signed   By: Zetta Bills M.D.   On: 03/09/2021 09:27    DVT Prophylaxis -SCD/heparin AM Labs Ordered, also please review Full Orders  Family Communication: Admission, patients condition and plan of care including tests being ordered have been discussed with the patient who indicate understanding and agree with the plan   Code Status - Full Code  Likely DC to home  Condition   stable  Roxan Hockey M.D on 03/09/2021 at 7:19 PM Go to [email or web address] -  for contact info  [facility] - Office  [phone]

## 2021-03-09 NOTE — ED Notes (Signed)
Pt presents to the department via [facility] pale, diaphoretic, tachypneic, sob, speaking 3-4 word sentences, tripoding, will not lay back on stretcher, anxious, smells of cigarrette smoke, hypertensive, co abdominal fullness and burping. Pt placed on 2lnc and then given albuterol tx per md order. Pt reports improvement of symptoms after tx began. Pt able to sit back on stretcher at 90 degree angle after tx.

## 2021-03-09 NOTE — ED Triage Notes (Signed)
SOB started this morning on his way into work, diaphoretic and pale on arrival to ED, tachypneic, increased work of breathing on arrival. RA sats 88% on room air. Hypertensive on arrival. Pt is distressed and anxious. Dr. Alvino Chapel and charge nurse notified.

## 2021-03-10 ENCOUNTER — Encounter (HOSPITAL_COMMUNITY): Payer: Self-pay | Admitting: Family Medicine

## 2021-03-10 DIAGNOSIS — Z20822 Contact with and (suspected) exposure to covid-19: Secondary | ICD-10-CM | POA: Diagnosis not present

## 2021-03-10 DIAGNOSIS — I2510 Atherosclerotic heart disease of native coronary artery without angina pectoris: Secondary | ICD-10-CM | POA: Diagnosis not present

## 2021-03-10 DIAGNOSIS — I25110 Atherosclerotic heart disease of native coronary artery with unstable angina pectoris: Secondary | ICD-10-CM | POA: Diagnosis not present

## 2021-03-10 DIAGNOSIS — J9601 Acute respiratory failure with hypoxia: Secondary | ICD-10-CM | POA: Diagnosis not present

## 2021-03-10 DIAGNOSIS — J449 Chronic obstructive pulmonary disease, unspecified: Secondary | ICD-10-CM | POA: Diagnosis not present

## 2021-03-10 DIAGNOSIS — I5042 Chronic combined systolic (congestive) and diastolic (congestive) heart failure: Secondary | ICD-10-CM | POA: Diagnosis not present

## 2021-03-10 LAB — CBC
HCT: 44.4 % (ref 39.0–52.0)
Hemoglobin: 15.1 g/dL (ref 13.0–17.0)
MCH: 34.7 pg — ABNORMAL HIGH (ref 26.0–34.0)
MCHC: 34 g/dL (ref 30.0–36.0)
MCV: 102.1 fL — ABNORMAL HIGH (ref 80.0–100.0)
Platelets: 262 10*3/uL (ref 150–400)
RBC: 4.35 MIL/uL (ref 4.22–5.81)
RDW: 12.8 % (ref 11.5–15.5)
WBC: 14.7 10*3/uL — ABNORMAL HIGH (ref 4.0–10.5)
nRBC: 0 % (ref 0.0–0.2)

## 2021-03-10 LAB — HIV ANTIBODY (ROUTINE TESTING W REFLEX): HIV Screen 4th Generation wRfx: NONREACTIVE

## 2021-03-10 LAB — BASIC METABOLIC PANEL
Anion gap: 14 (ref 5–15)
BUN: 15 mg/dL (ref 6–20)
CO2: 22 mmol/L (ref 22–32)
Calcium: 9.6 mg/dL (ref 8.9–10.3)
Chloride: 100 mmol/L (ref 98–111)
Creatinine, Ser: 0.8 mg/dL (ref 0.61–1.24)
GFR, Estimated: >60 mL/min (ref >60)
Glucose, Bld: 171 mg/dL — ABNORMAL HIGH (ref 70–99)
Potassium: 3.5 mmol/L (ref 3.5–5.1)
Sodium: 136 mmol/L (ref 135–145)

## 2021-03-10 LAB — GLUCOSE, CAPILLARY
Glucose-Capillary: 187 mg/dL — ABNORMAL HIGH (ref 70–99)
Glucose-Capillary: 157 mg/dL — ABNORMAL HIGH (ref 70–99)

## 2021-03-10 MED ORDER — METOPROLOL SUCCINATE ER 100 MG PO TB24: 100.0000 mg | ORAL_TABLET | Freq: Every day | ORAL | 3 refills | Status: DC

## 2021-03-10 MED ORDER — ATORVASTATIN CALCIUM 80 MG PO TABS: 80.0000 mg | ORAL_TABLET | Freq: Every day | ORAL | 5 refills | Status: DC

## 2021-03-10 MED ORDER — POTASSIUM CHLORIDE ER 10 MEQ PO TBCR: 10.0000 meq | EXTENDED_RELEASE_TABLET | Freq: Every day | ORAL | 2 refills | Status: DC

## 2021-03-10 MED ORDER — IPRATROPIUM-ALBUTEROL 0.5-2.5 (3) MG/3ML IN SOLN: 3.0000 mL | Freq: Two times a day (BID) | RESPIRATORY_TRACT | Status: DC

## 2021-03-10 MED ORDER — ALBUTEROL SULFATE (2.5 MG/3ML) 0.083% IN NEBU: 2.5000 mg | INHALATION_SOLUTION | RESPIRATORY_TRACT | 12 refills | Status: DC | PRN

## 2021-03-10 MED ORDER — PREDNISONE 20 MG PO TABS: 40.0000 mg | ORAL_TABLET | Freq: Every day | ORAL | 0 refills | Status: AC

## 2021-03-10 MED ORDER — AMLODIPINE BESYLATE 10 MG PO TABS: 10.0000 mg | ORAL_TABLET | Freq: Every day | ORAL | 3 refills | Status: DC

## 2021-03-10 MED ORDER — GUAIFENESIN ER 600 MG PO TB12: 600.0000 mg | ORAL_TABLET | Freq: Two times a day (BID) | ORAL | 0 refills | Status: DC

## 2021-03-10 MED ORDER — TICAGRELOR 90 MG PO TABS: 90.0000 mg | ORAL_TABLET | Freq: Two times a day (BID) | ORAL | 1 refills | Status: DC

## 2021-03-10 MED ORDER — UMECLIDINIUM BROMIDE 62.5 MCG/ACT IN AEPB: 1.0000 | INHALATION_SPRAY | Freq: Every day | RESPIRATORY_TRACT | 2 refills | Status: DC

## 2021-03-10 MED ORDER — LOSARTAN POTASSIUM 100 MG PO TABS: 100.0000 mg | ORAL_TABLET | Freq: Every day | ORAL | 5 refills | Status: DC

## 2021-03-10 MED ORDER — METOPROLOL SUCCINATE ER 50 MG PO TB24
100.0000 mg | ORAL_TABLET | Freq: Every day | ORAL | Status: DC
  Administered 2021-03-10: 100 mg via ORAL
  Filled 2021-03-10: qty 2

## 2021-03-10 MED ORDER — NICOTINE 21 MG/24HR TD PT24: 21.0000 mg | MEDICATED_PATCH | Freq: Every day | TRANSDERMAL | 0 refills | Status: DC

## 2021-03-10 MED ORDER — FUROSEMIDE 20 MG PO TABS: 20.0000 mg | ORAL_TABLET | Freq: Every day | ORAL | 11 refills | Status: DC

## 2021-03-10 MED ORDER — ASPIRIN 81 MG PO TBEC: 81.0000 mg | DELAYED_RELEASE_TABLET | Freq: Every day | ORAL | 11 refills | Status: AC

## 2021-03-10 MED ORDER — ALBUTEROL SULFATE HFA 108 (90 BASE) MCG/ACT IN AERS: 2.0000 | INHALATION_SPRAY | RESPIRATORY_TRACT | 1 refills | Status: DC | PRN

## 2021-03-10 MED ORDER — AZITHROMYCIN 500 MG PO TABS: 500.0000 mg | ORAL_TABLET | Freq: Every day | ORAL | 0 refills | Status: AC

## 2021-03-10 NOTE — Progress Notes (Signed)
EKG done and given to nurse 

## 2021-03-10 NOTE — Discharge Summary (Addendum)
Timothy Walker, is a 55 y.o. male  DOB 20-May-1965  MRN [id number].  Admission date:  03/09/2021  Admitting Physician  Roxan Hockey, MD  Discharge Date:  03/10/2021   Primary MD  Patient, [name] Per (Inactive)  Recommendations for primary care physician for things to follow:   1)Very low-salt diet advised (2 gm =2,000mg  of Sodium per day) 2)Weigh yourself daily, call if you gain more than 3 pounds in 1 day or more than 5 pounds in 1 week as your diuretic medications may need to be adjusted 3) complete abstinence  from tobacco advised--May use over-the-counter nicotine patch to help you quit smoking 4) abstinence from alcohol also advised 5)follow up with cardiologist Dr. Johnsie Cancel as scheduled within the next 1 to 2 weeks for recheck and reevaluation 6) please stop Cardizem/diltiazem CR heart ejection fraction is low 7) your metoprolol has been changed from metoprolol  tartrate which is short acting to  long-acting metoprolol succinate/Toprol-XL-- 8)You may have prediabetes----follow-up with your primary care physician for further investigations and testing as outpatient--- avoid excessive carbohydrate and sweet intake 9)Avoid ibuprofen/Advil/Aleve/Motrin/Goody Powders/Naproxen/BC powders/Meloxicam/Diclofenac/Indomethacin and other Nonsteroidal anti-inflammatory medications as these will make you more likely to bleed and can cause stomach ulcers, can also cause Kidney problems.    Admission Diagnosis  Respiratory failure with hypoxia (HCC) [J96.91]   Discharge Diagnosis  Respiratory failure with hypoxia ([place]) [J96.91]    Principal Problem:   Respiratory failure with hypoxia (HCC) Active Problems:   Coronary artery disease involving native coronary artery of native heart with unstable angina pectoris (HCC)   COPD (chronic obstructive pulmonary disease) (HCC)   Alcohol use      Past Medical History:   Diagnosis Date   COPD (chronic obstructive pulmonary disease) (HCC)    Coronary artery disease    a. 12/2012 Inf STEMI/Cath/PCI: LM nl, LAD nl, D1 50ost, D2 min irregs, D3 small, LCX 80-57m, OM1/2/3 min irregs, RI 20p, RCA 50p/100d (3.5x18 Xience DEs),PDA/PL nl, EF 35% - complicated by VF/CGS/IABP/VDRF   GERD (gastroesophageal reflux disease)    Hiatal hernia    Hypertension    Ischemic cardiomyopathy    a. 12/2012 Echo: EF 45-50%, basal inf, inflat, mid inf HK, mildly reduced RV fxn.   Marijuana abuse    MI (myocardial infarction) ([place]) 2014   Obstructive sleep apnea    Tobacco abuse     Past Surgical History:  Procedure Laterality Date   COLONOSCOPY WITH PROPOFOL N/A 05/14/2014   Dr. Gala Romney: anal canal hemorrhoid, rectosigmoid hyerpplastic polyp, right-sided divetriculosis    CORONARY ANGIOPLASTY  01/13/2013   STENT TO RCA BY DR Burt Knack   CORONARY STENT INTERVENTION N/A 10/07/2019   Procedure: CORONARY STENT INTERVENTION;  Surgeon: Leonie Man, MD;  Location: [facility];  Service: Cardiovascular;  Laterality: N/A;   CORONARY/GRAFT ACUTE MI REVASCULARIZATION N/A 10/07/2019   Procedure: Coronary/Graft Acute MI Revascularization;  Surgeon: Leonie Man, MD;  Location: [facility];  Service: Cardiovascular;  Laterality: N/A;   DENTAL SURGERY  ESOPHAGEAL DILATION N/A 05/14/2014   Procedure: ESOPHAGEAL DILATION [place];  Surgeon: Daneil Dolin, MD;  Location: [facility];  Service: Endoscopy;  Laterality: N/A;   ESOPHAGOGASTRODUODENOSCOPY (EGD) WITH PROPOFOL N/A 05/14/2014   Dr. Gala Romney: empiric dilation, abnormal esophagus s/p biopsy (normal), small hiatal hernia   LEFT HEART CATH AND CORONARY ANGIOGRAPHY N/A 09/26/2018   Procedure: LEFT HEART CATH AND CORONARY ANGIOGRAPHY;  Surgeon: Lorretta Harp, MD;  Location: [facility];  Service: Cardiovascular;  Laterality: N/A;   LEFT HEART CATH AND CORONARY ANGIOGRAPHY N/A 10/07/2019   Procedure: LEFT HEART  CATH AND CORONARY ANGIOGRAPHY;  Surgeon: Leonie Man, MD;  Location: [facility];  Service: Cardiovascular;  Laterality: N/A;   LEFT HEART CATHETERIZATION WITH CORONARY ANGIOGRAM N/A 01/13/2013   Procedure: LEFT HEART CATHETERIZATION WITH CORONARY ANGIOGRAM;  Surgeon: Wellington Hampshire, MD;  Location: [facility];  Service: Cardiovascular;  Laterality: N/A;   NASAL SEPTUM SURGERY     PERCUTANEOUS CORONARY STENT INTERVENTION (PCI-S)  01/13/2013   Procedure: PERCUTANEOUS CORONARY STENT INTERVENTION (PCI-S);  Surgeon: Wellington Hampshire, MD;  Location: [facility];  Service: Cardiovascular;;   POLYPECTOMY  05/14/2014   Procedure: POLYPECTOMY;  Surgeon: Daneil Dolin, MD;  Location: [facility];  Service: Endoscopy;;     HPI  from the history and physical done on the day of admission:   Timothy Walker  is a 55 y.o. male smoker with past medical history relevant for CAD with prior angioplasty and stenting of prior MI, COPD, HTN, who presents to the ED with worsening dyspnea and is found to be hypoxic in the ED -on admission O2 sat was 88% on room air 83% with ambulation in the setting of very elevated BP -BP was 200/140 mmhg at home -After aggressive BP control on IV diuresis hypoxia pretty much improved and subsequently resolved -Patient denies frank chest pain, -In the ED CTA chest consistent with volume overload/pulmonary edema heart failure, with some bronchitic/COPD type findings as well -BNP was elevated at 898  -Troponin 10 >> 43, -EKG sinus rhythm without acute ST changes--- patient remains chest pain-free -Patient reports chronic cough due to ongoing tobacco use -COVID and flu negative -WBC is 11.1 hemoglobin 16.2 -Creatinine 0.94,  LFTs are not elevated      Hospital Course:    Admitted for hypertensive crisis with flash pulmonary edema--resulting in acute hypoxic respiratory failure Requiring IV Lasix and aggressive BP control   1) hypertensive crisis leading to CHF/pulmonary  edema/prolonged with bilateral pleural effusion --BP was 200/140 mmhg at home -Patient initially presented with dyspnea, hypoxia -After improved BP control and aggressive diuresis oxygenation improved significantly -Patient was treated with amlodipine, metoprolol, losartan, IV Lasix and IV hydralazine as prescribed - Medications adjusted as in discharge instructions and discharge med rec -Prior to discharge patient ambulated several times in the hallway without chest pains or dyspnea on exertion or hypoxia   2) acute hypoxic respiratory failure--- on admission O2 sat was 88% on room air 83% with ambulation -After aggressive BP control on IV diuresis hypoxia pretty much improved and subsequently resolved -   3) acute COPD exacerbation--- smoking cessation advised, -Improved after treatment with IV Solu-Medrol, azithromycin bronchodilators and mucolytics as ordered -Discharged on bronchodilators including nebulizer treatments and prednisone   4)H/o CAD--history of prior MI with prior angioplasty and stenting -No ACS Symptoms at this time -Continue aspirin, Brilinta, Lipitor and metoprolol as ordered Troponin 10 >>43>>32>>26--suspect demand ischemia in the setting of  hypertensive crisis with flash pulmonary edema -EKG sinus rhythm without acute ST changes -Outpatient follow-up with pathology advised   5)HFrEF--patient with combined systolic and diastolic dysfunction CHF --Echo from 03/09/2021 shows grade 2 diastolic dysfunction and EF is 30 to 35% (EF is down from about 45% back in July 2021) left ventricle has moderately decreased function. The left ventricle  demonstrates regional wall motion abnormalities (see scoring  diagram/findings for description). The basal-to-mid  inferior wall is akinetic. The rest of the LV segments are hypokinetic --BNP was elevated at 898  -Improved with IV Lasix and BP control -Abstinence from salt tobacco and alcohol advised -Outpatient cardiology  follow-up advised   Disposition--Home with outpatient urology follow-up     Dispo: The patient is from: Home              Anticipated d/c is to: Home    Discharge Condition: stable  Follow UP   Follow-up Information     [facility] Follow up on 03/24/2021.   Specialty: Cardiology Why: Cardiology Hospital Follow-up on 03/24/2021 at 9:15 AM with Dr. Johnsie Cancel (Works with Dr. Harl Bowie) Contact information: [place] [phone]                 Consults obtained - Discussed with cards--patient will follow-up as outpatient  Diet and Activity recommendation:  As advised  Discharge Instructions    Discharge Instructions     Call MD for:  difficulty breathing, headache or visual disturbances   Complete by: As directed    Call MD for:  persistant dizziness or light-headedness   Complete by: As directed    Call MD for:  persistant nausea and vomiting   Complete by: As directed    Call MD for:  temperature >100.4   Complete by: As directed    Diet - low sodium heart healthy   Complete by: As directed    Discharge instructions   Complete by: As directed    1)Very low-salt diet advised (2 gm =2,000mg  of Sodium per day) 2)Weigh yourself daily, call if you gain more than 3 pounds in 1 day or more than 5 pounds in 1 week as your diuretic medications may need to be adjusted 3) complete abstinence  from tobacco advised--May use over-the-counter nicotine patch to help you quit smoking 4) abstinence from alcohol also advised 5)follow up with cardiologist Dr. Johnsie Cancel as scheduled within the next 1 to 2 weeks for recheck and reevaluation 6) please stop Cardizem/diltiazem CR heart ejection fraction is low 7) your metoprolol has been changed from metoprolol  tartrate which is short acting to  long-acting metoprolol succinate/Toprol-XL-- 8)You may have prediabetes----follow-up with your primary care physician for further investigations and  testing as outpatient--- avoid excessive carbohydrate and sweet intake 9)Avoid ibuprofen/Advil/Aleve/Motrin/Goody Powders/Naproxen/BC powders/Meloxicam/Diclofenac/Indomethacin and other Nonsteroidal anti-inflammatory medications as these will make you more likely to bleed and can cause stomach ulcers, can also cause Kidney problems.   For home use only DME Nebulizer machine   Complete by: As directed    Patient needs a nebulizer to treat with the following condition: COPD with acute exacerbation ([place])   Length of Need: Lifetime   Increase activity slowly   Complete by: As directed          Discharge Medications     Allergies as of 03/10/2021   No Known Allergies      Medication List     STOP taking these medications    aspirin 81  MG tablet Replaced by: aspirin 81 MG EC tablet   atenolol 100 MG tablet Commonly known as: TENORMIN   cyclobenzaprine 10 MG tablet Commonly known as: FLEXERIL   diltiazem 30 MG tablet Commonly known as: CARDIZEM   metoprolol tartrate 100 MG tablet Commonly known as: LOPRESSOR   metoprolol tartrate 50 MG tablet Commonly known as: LOPRESSOR   metoprolol-hydrochlorothiazide 100-50 MG tablet Commonly known as: LOPRESSOR HCT       TAKE these medications    albuterol 108 (90 Base) MCG/ACT inhaler Commonly known as: VENTOLIN HFA Inhale 2 puffs into the lungs every 4 (four) hours as needed for wheezing or shortness of breath.   albuterol (2.5 MG/3ML) 0.083% nebulizer solution Commonly known as: PROVENTIL Take 3 mLs (2.5 mg total) by nebulization every 2 (two) hours as needed for wheezing or shortness of breath.   amLODipine 10 MG tablet Commonly known as: NORVASC Take 1 tablet (10 mg total) by mouth daily. Start taking on: March 11, 2021 What changed:  medication strength how much to take when to take this   aspirin 81 MG EC tablet Take 1 tablet (81 mg total) by mouth daily with breakfast. Swallow whole. Start taking on:  March 11, 2021 Replaces: aspirin 81 MG tablet   atorvastatin 80 MG tablet Commonly known as: LIPITOR Take 1 tablet (80 mg total) by mouth daily. Start taking on: March 11, 2021 What changed: when to take this   azithromycin 500 MG tablet Commonly known as: ZITHROMAX Take 1 tablet (500 mg total) by mouth daily for 5 days.   furosemide 20 MG tablet Commonly known as: Lasix Take 1 tablet (20 mg total) by mouth daily.   guaiFENesin 600 MG 12 hr tablet Commonly known as: MUCINEX Take 1 tablet (600 mg total) by mouth 2 (two) times daily.   losartan 100 MG tablet Commonly known as: Cozaar Take 1 tablet (100 mg total) by mouth daily. What changed: Another medication with the same name was removed. Continue taking this medication, and follow the directions you see here.   metoprolol succinate 100 MG 24 hr tablet Commonly known as: TOPROL-XL Take 1 tablet (100 mg total) by mouth daily. Take with or immediately following a meal.   nicotine 21 mg/24hr patch Commonly known as: NICODERM CQ - dosed in mg/24 hours Place 1 patch (21 mg total) onto the skin daily. Start taking on: March 11, 2021   nitroGLYCERIN 0.4 MG SL tablet Commonly known as: Nitrostat Place 1 tablet (0.4 mg total) under the tongue every 5 (five) minutes as needed for chest pain.   pantoprazole 40 MG tablet Commonly known as: PROTONIX Take by mouth. What changed: Another medication with the same name was removed. Continue taking this medication, and follow the directions you see here.   potassium chloride 10 MEQ tablet Commonly known as: KLOR-CON Take 1 tablet (10 mEq total) by mouth daily. Take While taking Lasix/furosemide What changed:  how much to take when to take this additional instructions   predniSONE 20 MG tablet Commonly known as: DELTASONE Take 2 tablets (40 mg total) by mouth daily with breakfast for 5 days.   ticagrelor 90 MG Tabs tablet Commonly known as: Brilinta Take 1 tablet (90  mg total) by mouth 2 (two) times daily. What changed: how much to take   umeclidinium bromide 62.5 MCG/ACT Aepb Commonly known as: Incruse Ellipta Inhale 1 puff into the lungs daily. What changed: medication strength  Durable Medical Equipment  (From admission, onward)           Start     Ordered   03/10/21 0000  For home use only DME Nebulizer machine       Question Answer Comment  Patient needs a nebulizer to treat with the following condition COPD with acute exacerbation ([place])   Length of Need Lifetime      03/10/21 1230            Major procedures and Radiology Reports - PLEASE review detailed and final reports for all details, in brief -  DG Chest Portable 1 View  Result Date: 03/09/2021 CLINICAL DATA:  Chest pain, shortness of breath chest pain that started this morning on the way to work. EXAM: PORTABLE CHEST 1 VIEW COMPARISON:  December 27, 2020. FINDINGS: EKG leads project over the chest. Trachea midline. Cardiomediastinal contours and hilar structures are stable. Increased interstitial markings are noted throughout the chest, a change from previous imaging. No lobar consolidative process. Subtle patchy opacities at the lung bases, for instance in the LEFT mid chest. No pneumothorax. On limited assessment there is no acute skeletal process. IMPRESSION: Increased interstitial markings throughout the chest, a change from previous imaging. Findings could represent atypical infection or edema. Electronically Signed   By: Zetta Bills M.D.   On: 03/09/2021 08:13   ECHOCARDIOGRAM COMPLETE  Result Date: 03/09/2021    ECHOCARDIOGRAM REPORT   Patient Name:   Timothy Walker Date of Exam: 03/09/2021 Medical Rec #:  [id number]      Height:       71.0 in Accession #:    [id number]     Weight:       185.0 lb Date of Birth:  Feb 21, 1966      BSA:          2.040 m Patient Age:    23 years       BP:           162/110 mmHg Patient Gender: M              HR:            68 bpm. Exam Location:  [facility] Procedure: 2D Echo, Cardiac Doppler and Color Doppler Indications:    Dyspnea R06.00  History:        Patient has prior history of Echocardiogram examinations, most                 recent 10/08/2019. Previous Myocardial Infarction and CAD, COPD,                 Arrythmias:PVC; Risk Factors:Hypertension and Dyslipidemia.                 Alcohol use, Cardiogenic shock ([place]), NSTEMI (non-ST elevated                 myocardial infarction) ([place]), ST elevation myocardial infarction                 (STEMI) of inferior wall, initial episode of care [facility]),                 Ischemic cardiomyopathy (From Hx), Obstructive sleep apnea (From                 Hx).  Sonographer:    Alvino Chapel RCS Referring Phys: [phone] Simren Popson IMPRESSIONS  1. Left ventricular ejection fraction, by estimation, is 30 to 35%. The left ventricle has moderately decreased  function. The left ventricle demonstrates regional wall motion abnormalities (see scoring diagram/findings for description). The basal-to-mid  inferior wall is akinetic. The rest of the LV segments are hypokinetic. The left ventricular internal cavity size was moderately dilated. There is mild concentric left ventricular hypertrophy. Left ventricular diastolic parameters are consistent with Grade II diastolic dysfunction (pseudonormalization).  2. Right ventricular systolic function is normal. The right ventricular size is normal. Tricuspid regurgitation signal is inadequate for assessing PA pressure.  3. Left atrial size was severely dilated.  4. The mitral valve is normal in structure. Mild mitral valve regurgitation.  5. The aortic valve is tricuspid. There is mild calcification of the aortic valve. There is mild thickening of the aortic valve. Aortic valve regurgitation is not visualized. Aortic valve sclerosis/calcification is present, without any evidence of aortic stenosis.  6. Aortic dilatation noted. There is mild dilatation of the  aortic root, measuring 38 mm.  7. The inferior vena cava is normal in size with greater than 50% respiratory variability, suggesting right atrial pressure of 3 mmHg. Comparison(s): Compared to prior TTE in 2020, the LVEF has dropped from about 45% to 35%. FINDINGS  Left Ventricle: Left ventricular ejection fraction, by estimation, is 30 to 35%. The left ventricle has moderately decreased function. The left ventricle demonstrates regional wall motion abnormalities. The basal-to-mid inferior wall appears akinetic. The rest of the LV segments appear hypokinetic. The left ventricular internal cavity size was moderately dilated. There is mild concentric left ventricular hypertrophy. Left ventricular diastolic parameters are consistent with Grade II diastolic dysfunction (pseudonormalization). Right Ventricle: The right ventricular size is normal. No increase in right ventricular wall thickness. Right ventricular systolic function is normal. Tricuspid regurgitation signal is inadequate for assessing PA pressure. Left Atrium: Left atrial size was severely dilated. Right Atrium: Right atrial size was normal in size. Pericardium: There is no evidence of pericardial effusion. Mitral Valve: The mitral valve is normal in structure. Mild mitral valve regurgitation. Tricuspid Valve: The tricuspid valve is normal in structure. Tricuspid valve regurgitation is trivial. Aortic Valve: The aortic valve is tricuspid. There is mild calcification of the aortic valve. There is mild thickening of the aortic valve. Aortic valve regurgitation is not visualized. Aortic valve sclerosis/calcification is present, without any evidence of aortic stenosis. Pulmonic Valve: The pulmonic valve was normal in structure. Pulmonic valve regurgitation is trivial. Aorta: Aortic dilatation noted. There is mild dilatation of the aortic root, measuring 38 mm. Venous: The inferior vena cava is normal in size with greater than 50% respiratory variability,  suggesting right atrial pressure of 3 mmHg. IAS/Shunts: No atrial level shunt detected by color flow Doppler.  LEFT VENTRICLE PLAX 2D LVIDd:         6.80 cm      Diastology LVIDs:         6.50 cm      LV e' medial:    5.87 cm/s LV PW:         0.90 cm      LV E/e' medial:  11.6 LV IVS:        1.50 cm      LV e' lateral:   9.14 cm/s LVOT diam:     2.30 cm      LV E/e' lateral: 7.4 LV SV:         74 LV SV Index:   36 LVOT Area:     4.15 cm  LV Volumes (MOD) LV vol d, MOD A2C: 243.0 ml LV vol d, MOD  A4C: 233.0 ml LV vol s, MOD A2C: 215.0 ml LV vol s, MOD A4C: 131.0 ml LV SV MOD A2C:     28.0 ml LV SV MOD A4C:     233.0 ml LV SV MOD BP:      84.0 ml RIGHT VENTRICLE RV S prime:     10.20 cm/s TAPSE (M-mode): 1.9 cm LEFT ATRIUM              Index        RIGHT ATRIUM           Index LA diam:        3.80 cm  1.86 cm/m   RA Area:     19.70 cm LA Vol (A2C):   117.0 ml 57.36 ml/m  RA Volume:   54.90 ml  26.92 ml/m LA Vol (A4C):   85.8 ml  42.07 ml/m LA Biplane Vol: 100.0 ml 49.03 ml/m  AORTIC VALVE LVOT Vmax:   82.10 cm/s LVOT Vmean:  61.800 cm/s LVOT VTI:    0.177 m  AORTA Ao Root diam: 3.80 cm MITRAL VALVE MV Area (PHT): 3.17 cm    SHUNTS MV Decel Time: 239 msec    Systemic VTI:  0.18 m MV E velocity: 67.80 cm/s  Systemic Diam: 2.30 cm MV A velocity: 55.80 cm/s MV E/A ratio:  1.22 Gwyndolyn Kaufman MD Electronically signed by Gwyndolyn Kaufman MD Signature Date/Time: 03/09/2021/2:40:23 PM    Final    CT Angio Chest/Abd/Pel for Dissection W and/or Wo Contrast  Result Date: 03/09/2021 CLINICAL DATA:  A 55 year old male presents with chest pain, back pain and suspected aortic dissection. EXAM: CT ANGIOGRAPHY CHEST, ABDOMEN AND PELVIS TECHNIQUE: Non-contrast CT of the chest was initially obtained. Multidetector CT imaging through the chest, abdomen and pelvis was performed using the standard protocol during bolus administration of intravenous contrast. Multiplanar reconstructed images and MIPs were obtained and  reviewed to evaluate the vascular anatomy. CONTRAST:  110mL OMNIPAQUE IOHEXOL 350 MG/ML SOLN COMPARISON:  Chest x-ray of March 09, 2021. FINDINGS: CTA CHEST FINDINGS Cardiovascular: Noncontrast imaging of the chest with calcified atheromatous changes and signs of coronary artery disease, three-vessel disease. No signs of intramural hematoma of the thoracic aorta. Thoracic aorta with noncalcified plaque as well. 3.6 cm caliber of the ascending thoracic aorta. No acute aortic process in the chest. Moderate cardiac enlargement without substantial pericardial effusion. Central pulmonary vasculature normal caliber. Mediastinum/Nodes: Scattered small lymph nodes throughout the chest none with pathologic enlargement largest in the range of 11 mm. Mild thickening of the esophagus is diffuse. No adjacent stranding. Lungs/Pleura: Sub solid nodule in the RIGHT upper lobe 14 x 16 mm (image 53/7) Sub solid nodular opacity in the RIGHT upper lobe (image 38/7) 9 mm. Mild bronchial wall thickening throughout the chest. Septal thickening more pronounced at the lung bases. Ground-glass attenuation with geographic pattern more pronounced in the RIGHT lower lobe. Discrete ground-glass nodule in the RIGHT lower lobe (image 83/7) 9 x 7 mm. Small RIGHT lower lobe pulmonary nodule (image 83/7) 6 mm. Small ground-glass nodule in the LEFT upper lobe (image 70/7) 8 mm. Moderate paraseptal predominant pulmonary emphysema that is worse at the lung apices. Very small bilateral pleural effusions. Musculoskeletal: No acute bone finding. No destructive bone process. Spinal degenerative changes. Review of the MIP images confirms the above findings. CTA ABDOMEN AND PELVIS FINDINGS VASCULAR Aorta: Calcified and noncalcified atheromatous plaque in the abdominal aorta. No signs of adjacent stranding or dissection. No aneurysmal dilation. Celiac: Celiac with  classic anatomy. No signs of dissection, vasculitis or dilation. SMA: Patent superior  mesenteric artery, widely patent with minimal atherosclerotic changes. No acute process related to the SMA. Renals: Single renal artery with early bifurcation on the RIGHT. No signs of narrowing or acute process. Minimal atherosclerotic changes. Accessory renal artery to the lower pole of the LEFT kidney. Renal vasculature on the LEFT is patent. No signs of acute process. IMA: IMA is patent without acute finding. Inflow: Patent without evidence of aneurysm, dissection, vasculitis or significant stenosis. Veins: Venous structures with normal caliber, limited assessment on the arterial phase. Proximal outflow: Runoff into the proximal thigh without signs of substantial narrowing or dilation. Contrast passes into vessels in the upper thigh. Review of the MIP images confirms the above findings. NON-VASCULAR Hepatobiliary: Limited assessment of the liver without acute process or visible lesion. Pancreas: Normal, without mass, inflammation or ductal dilatation. Spleen: Spleen normal size and contour. Adrenals/Urinary Tract: Adrenal glands are unremarkable. Symmetric renal enhancement. No sign of hydronephrosis. No suspicious renal lesion or perinephric stranding. Urinary bladder is grossly unremarkable. Stomach/Bowel: No acute gastrointestinal findings. The appendix is normal. Sigmoid diverticulosis. Lymphatic: There is no gastrohepatic or hepatoduodenal ligament lymphadenopathy. No retroperitoneal or mesenteric lymphadenopathy. No pelvic sidewall lymphadenopathy. Reproductive: Unremarkable by CT. Other: No ascites.  No free air. Musculoskeletal: No acute bone finding. No destructive bone process. Spinal degenerative changes. Review of the MIP images confirms the above findings. IMPRESSION: No signs of acute aortic process. Septal thickening and small bilateral pleural effusions with ground-glass favored to represent sequela of heart failure or volume overload. Given patchy appearance of ground-glass in some areas would  consider the possibility of superimposed viral or atypical process. Bilateral areas of sub solid nodularity and a single solid RIGHT lower lobe pulmonary nodule. In the current context would consider a 3 month follow-up to assess for signs of resolution or persistence with additional follow-up to be predicated on the most suspicious remaining finding. Cardiomegaly and signs of coronary artery disease, three-vessel disease. Mild thickening of the esophagus, correlate with signs of esophagitis. Pulmonary emphysema. Aortic atherosclerosis. Aortic Atherosclerosis (ICD10-I70.0) and Emphysema (ICD10-J43.9). Electronically Signed   By: Zetta Bills M.D.   On: 03/09/2021 09:27    Micro Results   Recent Results (from the past 240 hour(s))  Resp Panel by RT-PCR (Flu A&B, Covid) Nasopharyngeal Swab     Status: None   Collection Time: 03/09/21  8:30 AM   Specimen: Nasopharyngeal Swab; Nasopharyngeal(NP) swabs in vial transport medium  Result Value Ref Range Status   SARS Coronavirus 2 by RT PCR NEGATIVE NEGATIVE Final    Comment: (NOTE) SARS-CoV-2 target nucleic acids are NOT DETECTED.  The SARS-CoV-2 RNA is generally detectable in upper respiratory specimens during the acute phase of infection. The lowest concentration of SARS-CoV-2 viral copies this assay can detect is 138 copies/mL. A negative result does not preclude SARS-Cov-2 infection and should not be used as the sole basis for treatment or other patient management decisions. A negative result may occur with  improper specimen collection/handling, submission of specimen other than nasopharyngeal swab, presence of viral mutation(s) within the areas targeted by this assay, and inadequate number of viral copies(<138 copies/mL). A negative result must be combined with clinical observations, patient history, and epidemiological information. The expected result is Negative.  Fact Sheet for Patients:   EntrepreneurPulse.com.au  Fact Sheet for Healthcare Providers:  IncredibleEmployment.be  This test is no t yet approved or cleared by the [place] FDA and  has been  authorized for detection and/or diagnosis of SARS-CoV-2 by FDA under an Emergency Use Authorization (EUA). This EUA will remain  in effect (meaning this test can be used) for the duration of the COVID-19 declaration under Section 564(b)(1) of the Act, 21 U.S.C.section 360bbb-3(b)(1), unless the authorization is terminated  or revoked sooner.       Influenza A by PCR NEGATIVE NEGATIVE Final   Influenza B by PCR NEGATIVE NEGATIVE Final    Comment: (NOTE) The Xpert Xpress SARS-CoV-2/FLU/RSV plus assay is intended as an aid in the diagnosis of influenza from Nasopharyngeal swab specimens and should not be used as a sole basis for treatment. Nasal washings and aspirates are unacceptable for Xpert Xpress SARS-CoV-2/FLU/RSV testing.  Fact Sheet for Patients: EntrepreneurPulse.com.au  Fact Sheet for Healthcare Providers: IncredibleEmployment.be  This test is not yet approved or cleared by the [place] FDA and has been authorized for detection and/or diagnosis of SARS-CoV-2 by FDA under an Emergency Use Authorization (EUA). This EUA will remain in effect (meaning this test can be used) for the duration of the COVID-19 declaration under Section 564(b)(1) of the Act, 21 U.S.C. section 360bbb-3(b)(1), unless the authorization is terminated or revoked.  Performed at [facility], [address], [place], [place] [address]        Today   Subjective    Timothy Walker today has no new complaints No fever  Or chills   No Nausea, Vomiting or Diarrhea        Patient has been seen and examined prior to discharge   Objective   Blood pressure (!) 150/102, pulse 76, temperature 97.6 F (36.4 C), temperature source Oral, resp. rate 20,  height 5\' 11"  (1.803 m), weight 84.5 kg, SpO2 99 %.   Intake/Output Summary (Last 24 hours) at 03/10/2021 1232 Last data filed at 03/10/2021 0900 Gross per 24 hour  Intake 960 ml  Output 3200 ml  Net -2240 ml   Exam Gen:- Awake Alert, no acute distress  HEENT:- [place].AT, No sclera icterus Neck-Supple Neck,No JVD,.  Lungs-improved air movement, no rales no wheezing CV- S1, S2 normal, regular Abd-  +ve B.Sounds, Abd Soft, No tenderness,    Extremity/Skin:- No  edema,   good pulses Psych-affect is appropriate, oriented x3 Neuro-no new focal deficits, no tremors    Data Review   CBC w Diff:  Lab Results  Component Value Date   WBC 14.7 (H) 03/10/2021   HGB 15.1 03/10/2021   HCT 44.4 03/10/2021   PLT 262 03/10/2021   LYMPHOPCT 33 03/09/2021   MONOPCT 10 03/09/2021   EOSPCT 2 03/09/2021   BASOPCT 1 03/09/2021    CMP:  Lab Results  Component Value Date   NA 136 03/10/2021   K 3.5 03/10/2021   CL 100 03/10/2021   CO2 22 03/10/2021   BUN 15 03/10/2021   CREATININE 0.80 03/10/2021   CREATININE 0.90 01/14/2018   PROT 7.8 03/09/2021   ALBUMIN 4.1 03/09/2021   BILITOT 0.4 03/09/2021   ALKPHOS 63 03/09/2021   AST 28 03/09/2021   ALT 18 03/09/2021  .   Total Discharge time is about 33 minutes  Roxan Hockey M.D on 03/10/2021 at 12:32 PM  Go to [email or web address] -  for contact info  [facility] - Office  [phone]

## 2021-03-10 NOTE — Progress Notes (Signed)
Has been ambulating in hallway today with no c/o SOB.  O2 sats are in 90's on room air.  Discharge instructions reviewed with changes in medications and neb machine delivered to room.  Work note given by Dr. Maurene Capes. Follow up appt;with cardiology on Monday.  IV's removed and trying to find ride home.

## 2021-03-10 NOTE — Discharge Instructions (Signed)
1)Very low-salt diet advised (2 gm =2,000mg  of Sodium per day) 2)Weigh yourself daily, call if you gain more than 3 pounds in 1 day or more than 5 pounds in 1 week as your diuretic medications may need to be adjusted 3) complete abstinence  from tobacco advised--May use over-the-counter nicotine patch to help you quit smoking 4) abstinence from alcohol also advised 5)follow up with cardiologist Dr. Johnsie Cancel as scheduled within the next 1 to 2 weeks for recheck and reevaluation 6) please stop Cardizem/diltiazem CR heart ejection fraction is low 7) your metoprolol has been changed from metoprolol  tartrate which is short acting to  long-acting metoprolol succinate/Toprol-XL-- 8)You may have prediabetes----follow-up with your primary care physician for further investigations and testing as outpatient--- avoid excessive carbohydrate and sweet intake 9)Avoid ibuprofen/Advil/Aleve/Motrin/Goody Powders/Naproxen/BC powders/Meloxicam/Diclofenac/Indomethacin and other Nonsteroidal anti-inflammatory medications as these will make you more likely to bleed and can cause stomach ulcers, can also cause Kidney problems.

## 2021-03-10 NOTE — TOC Transition Note (Signed)
Transition of Care [facility]) - CM/SW Discharge Note   Patient Details  Name: Timothy Walker MRN: [id number] Date of Birth: 20-Feb-1966  [facility]) CM/SW Contact:  Boneta Lucks, RN Phone Number: 03/10/2021, 1:13 PM   Clinical Narrative:   Patient is discharging home today needing a neb machine. Caryl Pina with [employer] accepted the referral and will have delivered to the home.   Final next level of care: Home/Self Care Barriers to Discharge: Barriers Resolved  Patient Goals and CMS Choice Patient states their goals for this hospitalization and ongoing recovery are:: to go home. CMS Medicare.gov Compare Post Acute Care list provided to:: Patient Choice offered to / list presented to : Patient  Discharge Placement        Patient and family notified of of transfer: 03/10/21  Discharge Plan and Services         DME Agency: [employer] Date DME Agency Contacted: 03/10/21 Time DME Agency Contacted: 1222 Representative spoke with at DME Agency: Caryl Pina   Readmission Risk Interventions No flowsheet data found.

## 2021-03-11 NOTE — Progress Notes (Signed)
CARDIOLOGY CONSULT NOTE       Timothy Walker ID: Timothy Walker MRN: [id number] DOB/AGE: [birth year] 55 y.o.  Admit date: (Not on file) Referring Physician: Odem Primary Physician: Timothy Walker Per (Inactive) Primary Cardiologist: Branch Reason for Consultation: CHF/HTN/Pulmonary Edema     HPI:  55 y.o. referred TOC/Post hospital by Dr Denton Brick for HTN urgency, pulmonary edema and.  Timothy Walker of Dr Harl Bowie He is a smoker with COPD and known CAD with ischemic DCM.  Admitted 03/09/21 with HTN crisis and pulmonary edema. R/O no chest pain CXR with cephalization no effusions TTE with EF 30-35% inferior RWMA mild MR normal RV severe LAE.    CAD history includes RCA stent with STEMI and VF arrest 2014.  Had cath and lesion in circumflex not intevened on 10/16/18 But had SEMI and stent to mid circ/OM2 continuation on 10/07/19  D/c after diuresis in 48 hours from admission R/O BNP only 898 ECG With SR LVH and new lateral T wave changes in V4-6  Since d/c has not taken norvasc. And BP a bit high but improved in office Breathing better Long discussion about stopping smoking and significance of pulmonary edema. I also did not like his ECG with lateral T wave inversions   Discussed repeat cath this week to make sure stents Timothy Walker and then f/u with Dr Harl Bowie to  Optimize meds and do MRI in 2-3 months to risk stratify for AICD  He works at [employer] and I told him should do cath before he goes back to work   ROS All other systems reviewed and negative except as noted above  Past Medical History:  Diagnosis Date   COPD (chronic obstructive pulmonary disease) ([place])    Coronary artery disease    a. 12/2012 Inf STEMI/Cath/PCI: LM nl, LAD nl, D1 50ost, D2 min irregs, D3 small, LCX 80-51m, OM1/2/3 min irregs, RI 20p, RCA 50p/100d (3.5x18 Xience DEs),PDA/PL nl, EF 12% - complicated by VF/CGS/IABP/VDRF   GERD (gastroesophageal reflux disease)    Hiatal hernia    Hypertension    Ischemic  cardiomyopathy    a. 12/2012 Echo: EF 45-50%, basal inf, inflat, mid inf HK, mildly reduced RV fxn.   Marijuana abuse    MI (myocardial infarction) ([place]) 2014   Obstructive sleep apnea    Tobacco abuse     Family History  Problem Relation Age of Onset   Heart attack Other    Hypertension Other    Colon cancer Neg Hx     Social History   Socioeconomic History   Marital status: Divorced    Spouse name: Not on file   Number of children: Not on file   Years of education: Not on file   Highest education level: Not on file  Occupational History   Not on file  Tobacco Use   Smoking status: Every Day    Packs/day: 0.75    Years: 31.00    Pack years: 23.25    Types: Cigarettes    Start date: 11/21/1981   Smokeless tobacco: Never  Vaping Use   Vaping Use: Some days   Substances: Nicotine, CBD  Substance and Sexual Activity   Alcohol use: Yes    Alcohol/week: 2.0 standard drinks    Types: 1 Cans of beer, 1 Shots of liquor per week    Comment: 3-5 shots of liquor daily   Drug use: Yes    Types: Marijuana    Comment: last night   Sexual activity: Yes  Other Topics Concern  Not on file  Social History Narrative   Not on file   Social Determinants of Health   Financial Resource Strain: Not on file  Food Insecurity: Not on file  Transportation Needs: Not on file  Physical Activity: Not on file  Stress: Not on file  Social Connections: Not on file  Intimate Partner Violence: Not on file    Past Surgical History:  Procedure Laterality Date   COLONOSCOPY WITH PROPOFOL N/A 05/14/2014   Dr. Gala Romney: anal canal hemorrhoid, rectosigmoid hyerpplastic polyp, right-sided divetriculosis    CORONARY ANGIOPLASTY  01/13/2013   STENT TO RCA BY DR Burt Knack   CORONARY STENT INTERVENTION N/A 10/07/2019   Procedure: CORONARY STENT INTERVENTION;  Surgeon: Leonie Man, MD;  Location: [place] [facility];  Service: Cardiovascular;  Laterality: N/A;   CORONARY/GRAFT ACUTE MI  REVASCULARIZATION N/A 10/07/2019   Procedure: Coronary/Graft Acute MI Revascularization;  Surgeon: Leonie Man, MD;  Location: [place] [facility];  Service: Cardiovascular;  Laterality: N/A;   DENTAL SURGERY     ESOPHAGEAL DILATION N/A 05/14/2014   Procedure: ESOPHAGEAL DILATION [place];  Surgeon: Daneil Dolin, MD;  Location: [facility];  Service: Endoscopy;  Laterality: N/A;   ESOPHAGOGASTRODUODENOSCOPY (EGD) WITH PROPOFOL N/A 05/14/2014   Dr. Gala Romney: empiric dilation, abnormal esophagus s/p biopsy (normal), small hiatal hernia   LEFT HEART CATH AND CORONARY ANGIOGRAPHY N/A 09/26/2018   Procedure: LEFT HEART CATH AND CORONARY ANGIOGRAPHY;  Surgeon: Lorretta Harp, MD;  Location: [place] [facility];  Service: Cardiovascular;  Laterality: N/A;   LEFT HEART CATH AND CORONARY ANGIOGRAPHY N/A 10/07/2019   Procedure: LEFT HEART CATH AND CORONARY ANGIOGRAPHY;  Surgeon: Leonie Man, MD;  Location: [place] [facility];  Service: Cardiovascular;  Laterality: N/A;   LEFT HEART CATHETERIZATION WITH CORONARY ANGIOGRAM N/A 01/13/2013   Procedure: LEFT HEART CATHETERIZATION WITH CORONARY ANGIOGRAM;  Surgeon: Wellington Hampshire, MD;  Location: [place] [facility];  Service: Cardiovascular;  Laterality: N/A;   NASAL SEPTUM SURGERY     PERCUTANEOUS CORONARY STENT INTERVENTION (PCI-S)  01/13/2013   Procedure: PERCUTANEOUS CORONARY STENT INTERVENTION (PCI-S);  Surgeon: Wellington Hampshire, MD;  Location: [facility];  Service: Cardiovascular;;   POLYPECTOMY  05/14/2014   Procedure: POLYPECTOMY;  Surgeon: Daneil Dolin, MD;  Location: [facility];  Service: Endoscopy;;      Current Outpatient Medications:    albuterol (PROVENTIL) (2.5 MG/3ML) 0.083% nebulizer solution, Take 3 mLs (2.5 mg total) by nebulization every 2 (two) hours as needed for wheezing or shortness of breath., Disp: 75 mL, Rfl: 12   albuterol (VENTOLIN HFA) 108 (90 Base) MCG/ACT inhaler, Inhale 2 puffs into the lungs every 4 (four) hours as needed for  wheezing or shortness of breath., Disp: 18 g, Rfl: 1   amLODipine (NORVASC) 10 MG tablet, Take 1 tablet (10 mg total) by mouth daily., Disp: 90 tablet, Rfl: 3   aspirin EC 81 MG EC tablet, Take 1 tablet (81 mg total) by mouth daily with breakfast. Swallow whole., Disp: 30 tablet, Rfl: 11   atorvastatin (LIPITOR) 80 MG tablet, Take 1 tablet (80 mg total) by mouth daily., Disp: 90 tablet, Rfl: 5   azithromycin (ZITHROMAX) 500 MG tablet, Take 1 tablet (500 mg total) by mouth daily for 5 days., Disp: 5 tablet, Rfl: 0   furosemide (LASIX) 20 MG tablet, Take 1 tablet (20 mg total) by mouth daily., Disp: 30 tablet, Rfl: 11   guaiFENesin (MUCINEX) 600 MG 12 hr tablet, Take 1  tablet (600 mg total) by mouth 2 (two) times daily., Disp: 20 tablet, Rfl: 0   losartan (COZAAR) 100 MG tablet, Take 1 tablet (100 mg total) by mouth daily., Disp: 90 tablet, Rfl: 5   metoprolol succinate (TOPROL-XL) 100 MG 24 hr tablet, Take 1 tablet (100 mg total) by mouth daily. Take with or immediately following a meal., Disp: 90 tablet, Rfl: 3   nitroGLYCERIN (NITROSTAT) 0.4 MG SL tablet, Place 1 tablet (0.4 mg total) under the tongue every 5 (five) minutes as needed for chest pain., Disp: 25 tablet, Rfl: 3   pantoprazole (PROTONIX) 40 MG tablet, Take by mouth., Disp: , Rfl:    potassium chloride (KLOR-CON) 10 MEQ tablet, Take 1 tablet (10 mEq total) by mouth daily. Take While taking Lasix/furosemide, Disp: 30 tablet, Rfl: 2   predniSONE (DELTASONE) 20 MG tablet, Take 2 tablets (40 mg total) by mouth daily with breakfast for 5 days., Disp: 10 tablet, Rfl: 0   ticagrelor (BRILINTA) 90 MG TABS tablet, Take 1 tablet (90 mg total) by mouth 2 (two) times daily., Disp: 180 tablet, Rfl: 1   umeclidinium bromide (INCRUSE ELLIPTA) 62.5 MCG/ACT AEPB, Inhale 1 puff into the lungs daily., Disp: 1 each, Rfl: 2   nicotine (NICODERM CQ - DOSED IN MG/24 HOURS) 21 mg/24hr patch, Place 1 patch (21 mg total) onto the skin daily. (Timothy Walker not taking:  Reported on 03/14/2021), Disp: 28 patch, Rfl: 0    Physical Exam: Blood pressure (!) 140/96, pulse 80, height 5\' 11"  (1.803 m), weight 188 lb (85.3 kg), SpO2 93 %.    Affect appropriate Healthy:  appears stated age 41: normal Neck supple with no adenopathy JVP normal no bruits no thyromegaly Lungs clear with no wheezing and good diaphragmatic motion Heart:  S1/S2 no murmur, no rub, gallop or click PMI normal Abdomen: benighn, BS positve, no tenderness, no AAA no bruit.  No HSM or HJR Distal pulses intact with no bruits No edema Neuro non-focal Skin warm and dry No muscular weakness   Labs:   Lab Results  Component Value Date   WBC 14.7 (H) 03/10/2021   HGB 15.1 03/10/2021   HCT 44.4 03/10/2021   MCV 102.1 (H) 03/10/2021   PLT 262 03/10/2021    Recent Labs  Lab 03/09/21 0811 03/10/21 0720  NA 141 136  K 3.6 3.5  CL 104 100  CO2 26 22  BUN 13 15  CREATININE 0.94 0.80  CALCIUM 9.0 9.6  PROT 7.8  --   BILITOT 0.4  --   ALKPHOS 63  --   ALT 18  --   AST 28  --   GLUCOSE 205* 171*   Lab Results  Component Value Date   CKTOTAL 9,032 (H) 01/14/2013   CKMB 128.7 (HH) 01/14/2013   TROPONINI <0.30 06/10/2013    Lab Results  Component Value Date   CHOL 146 10/09/2019   CHOL 131 09/26/2018   CHOL 148 01/13/2013   Lab Results  Component Value Date   HDL 39 (L) 10/09/2019   HDL 29 (L) 09/26/2018   HDL 31 (L) 01/13/2013   Lab Results  Component Value Date   LDLCALC 74 10/09/2019   LDLCALC 57 09/26/2018   LDLCALC 81 01/13/2013   Lab Results  Component Value Date   TRIG 165 (H) 10/09/2019   TRIG 227 (H) 09/26/2018   TRIG 178 (H) 01/13/2013   Lab Results  Component Value Date   CHOLHDL 3.7 10/09/2019   CHOLHDL 4.5 09/26/2018  CHOLHDL 4.8 01/13/2013   No results found for: LDLDIRECT    Radiology: DG Chest Portable 1 View  Result Date: 03/09/2021 CLINICAL DATA:  Chest pain, shortness of breath chest pain that started this morning on the way  to work. EXAM: PORTABLE CHEST 1 VIEW COMPARISON:  December 27, 2020. FINDINGS: EKG leads project over the chest. Trachea midline. Cardiomediastinal contours and hilar structures are stable. Increased interstitial markings are noted throughout the chest, a change from previous imaging. No lobar consolidative process. Subtle patchy opacities at the lung bases, for instance in the LEFT mid chest. No pneumothorax. On limited assessment there is no acute skeletal process. IMPRESSION: Increased interstitial markings throughout the chest, a change from previous imaging. Findings could represent atypical infection or edema. Electronically Signed   By: Zetta Bills M.D.   On: 03/09/2021 08:13   ECHOCARDIOGRAM COMPLETE  Result Date: 03/09/2021    ECHOCARDIOGRAM REPORT   Timothy Walker Name:   Timothy Walker Date of Exam: 03/09/2021 Medical Rec #:  [id number]      Height:       71.0 in Accession #:    [id number]     Weight:       185.0 lb Date of Birth:  [birth year]      BSA:          2.040 m Timothy Walker Age:    40 years       BP:           162/110 mmHg Timothy Walker Gender: M              HR:           68 bpm. Exam Location:  [facility] Procedure: 2D Echo, Cardiac Doppler and Color Doppler Indications:    Dyspnea R06.00  History:        Timothy Walker has prior history of Echocardiogram examinations, most                 recent 10/08/2019. Previous Myocardial Infarction and CAD, COPD,                 Arrythmias:PVC; Risk Factors:Hypertension and Dyslipidemia.                 Alcohol use, Cardiogenic shock ([place]), NSTEMI (non-ST elevated                 myocardial infarction) ([place]), ST elevation myocardial infarction                 (STEMI) of inferior wall, initial episode of care [facility]),                 Ischemic cardiomyopathy (From Hx), Obstructive sleep apnea (From                 Hx).  Sonographer:    Alvino Chapel RCS Referring Phys: [phone] COURAGE EMOKPAE IMPRESSIONS  1. Left ventricular ejection fraction, by estimation, is 30 to 35%. The left  ventricle has moderately decreased function. The left ventricle demonstrates regional wall motion abnormalities (see scoring diagram/findings for description). The basal-to-mid  inferior wall is akinetic. The rest of the LV segments are hypokinetic. The left ventricular internal cavity size was moderately dilated. There is mild concentric left ventricular hypertrophy. Left ventricular diastolic parameters are consistent with Grade II diastolic dysfunction (pseudonormalization).  2. Right ventricular systolic function is normal. The right ventricular size is normal. Tricuspid regurgitation signal is inadequate for assessing PA pressure.  3. Left atrial size was severely dilated.  4. The mitral valve is normal in structure. Mild mitral valve regurgitation.  5. The aortic valve is tricuspid. There is mild calcification of the aortic valve. There is mild thickening of the aortic valve. Aortic valve regurgitation is not visualized. Aortic valve sclerosis/calcification is present, without any evidence of aortic stenosis.  6. Aortic dilatation noted. There is mild dilatation of the aortic root, measuring 38 mm.  7. The inferior vena cava is normal in size with greater than 50% respiratory variability, suggesting right atrial pressure of 3 mmHg. Comparison(s): Compared to prior TTE in 2020, the LVEF has dropped from about 45% to 35%. FINDINGS  Left Ventricle: Left ventricular ejection fraction, by estimation, is 30 to 35%. The left ventricle has moderately decreased function. The left ventricle demonstrates regional wall motion abnormalities. The basal-to-mid inferior wall appears akinetic. The rest of the LV segments appear hypokinetic. The left ventricular internal cavity size was moderately dilated. There is mild concentric left ventricular hypertrophy. Left ventricular diastolic parameters are consistent with Grade II diastolic dysfunction (pseudonormalization). Right Ventricle: The right ventricular size is normal. No  increase in right ventricular wall thickness. Right ventricular systolic function is normal. Tricuspid regurgitation signal is inadequate for assessing PA pressure. Left Atrium: Left atrial size was severely dilated. Right Atrium: Right atrial size was normal in size. Pericardium: There is no evidence of pericardial effusion. Mitral Valve: The mitral valve is normal in structure. Mild mitral valve regurgitation. Tricuspid Valve: The tricuspid valve is normal in structure. Tricuspid valve regurgitation is trivial. Aortic Valve: The aortic valve is tricuspid. There is mild calcification of the aortic valve. There is mild thickening of the aortic valve. Aortic valve regurgitation is not visualized. Aortic valve sclerosis/calcification is present, without any evidence of aortic stenosis. Pulmonic Valve: The pulmonic valve was normal in structure. Pulmonic valve regurgitation is trivial. Aorta: Aortic dilatation noted. There is mild dilatation of the aortic root, measuring 38 mm. Venous: The inferior vena cava is normal in size with greater than 50% respiratory variability, suggesting right atrial pressure of 3 mmHg. IAS/Shunts: No atrial level shunt detected by color flow Doppler.  LEFT VENTRICLE PLAX 2D LVIDd:         6.80 cm      Diastology LVIDs:         6.50 cm      LV e' medial:    5.87 cm/s LV PW:         0.90 cm      LV E/e' medial:  11.6 LV IVS:        1.50 cm      LV e' lateral:   9.14 cm/s LVOT diam:     2.30 cm      LV E/e' lateral: 7.4 LV SV:         74 LV SV Index:   36 LVOT Area:     4.15 cm  LV Volumes (MOD) LV vol d, MOD A2C: 243.0 ml LV vol d, MOD A4C: 233.0 ml LV vol s, MOD A2C: 215.0 ml LV vol s, MOD A4C: 131.0 ml LV SV MOD A2C:     28.0 ml LV SV MOD A4C:     233.0 ml LV SV MOD BP:      84.0 ml RIGHT VENTRICLE RV S prime:     10.20 cm/s TAPSE (M-mode): 1.9 cm LEFT ATRIUM              Index        RIGHT ATRIUM  Index LA diam:        3.80 cm  1.86 cm/m   RA Area:     19.70 cm LA Vol (A2C):    117.0 ml 57.36 ml/m  RA Volume:   54.90 ml  26.92 ml/m LA Vol (A4C):   85.8 ml  42.07 ml/m LA Biplane Vol: 100.0 ml 49.03 ml/m  AORTIC VALVE LVOT Vmax:   82.10 cm/s LVOT Vmean:  61.800 cm/s LVOT VTI:    0.177 m  AORTA Ao Root diam: 3.80 cm MITRAL VALVE MV Area (PHT): 3.17 cm    SHUNTS MV Decel Time: 239 msec    Systemic VTI:  0.18 m MV E velocity: 67.80 cm/s  Systemic Diam: 2.30 cm MV A velocity: 55.80 cm/s MV E/A ratio:  1.22 Gwyndolyn Kaufman MD Electronically signed by Gwyndolyn Kaufman MD Signature Date/Time: 03/09/2021/2:40:23 PM    Final    CT Angio Chest/Abd/Pel for Dissection W and/or Wo Contrast  Result Date: 03/09/2021 CLINICAL DATA:  A 55 year old male presents with chest pain, back pain and suspected aortic dissection. EXAM: CT ANGIOGRAPHY CHEST, ABDOMEN AND PELVIS TECHNIQUE: Non-contrast CT of the chest was initially obtained. Multidetector CT imaging through the chest, abdomen and pelvis was performed using the standard protocol during bolus administration of intravenous contrast. Multiplanar reconstructed images and MIPs were obtained and reviewed to evaluate the vascular anatomy. CONTRAST:  191mL OMNIPAQUE IOHEXOL 350 MG/ML SOLN COMPARISON:  Chest x-ray of March 09, 2021. FINDINGS: CTA CHEST FINDINGS Cardiovascular: Noncontrast imaging of the chest with calcified atheromatous changes and signs of coronary artery disease, three-vessel disease. No signs of intramural hematoma of the thoracic aorta. Thoracic aorta with noncalcified plaque as well. 3.6 cm caliber of the ascending thoracic aorta. No acute aortic process in the chest. Moderate cardiac enlargement without substantial pericardial effusion. Central pulmonary vasculature normal caliber. Mediastinum/Nodes: Scattered small lymph nodes throughout the chest none with pathologic enlargement largest in the range of 11 mm. Mild thickening of the esophagus is diffuse. No adjacent stranding. Lungs/Pleura: Sub solid nodule in the RIGHT  upper lobe 14 x 16 mm (image 53/7) Sub solid nodular opacity in the RIGHT upper lobe (image 38/7) 9 mm. Mild bronchial wall thickening throughout the chest. Septal thickening more pronounced at the lung bases. Ground-glass attenuation with geographic pattern more pronounced in the RIGHT lower lobe. Discrete ground-glass nodule in the RIGHT lower lobe (image 83/7) 9 x 7 mm. Small RIGHT lower lobe pulmonary nodule (image 83/7) 6 mm. Small ground-glass nodule in the LEFT upper lobe (image 70/7) 8 mm. Moderate paraseptal predominant pulmonary emphysema that is worse at the lung apices. Very small bilateral pleural effusions. Musculoskeletal: No acute bone finding. No destructive bone process. Spinal degenerative changes. Review of the MIP images confirms the above findings. CTA ABDOMEN AND PELVIS FINDINGS VASCULAR Aorta: Calcified and noncalcified atheromatous plaque in the abdominal aorta. No signs of adjacent stranding or dissection. No aneurysmal dilation. Celiac: Celiac with classic anatomy. No signs of dissection, vasculitis or dilation. SMA: Patent superior mesenteric artery, widely patent with minimal atherosclerotic changes. No acute process related to the SMA. Renals: Single renal artery with early bifurcation on the RIGHT. No signs of narrowing or acute process. Minimal atherosclerotic changes. Accessory renal artery to the lower pole of the LEFT kidney. Renal vasculature on the LEFT is patent. No signs of acute process. IMA: IMA is patent without acute finding. Inflow: Patent without evidence of aneurysm, dissection, vasculitis or significant stenosis. Veins: Venous structures with normal caliber, limited assessment on  the arterial phase. Proximal outflow: Runoff into the proximal thigh without signs of substantial narrowing or dilation. Contrast passes into vessels in the upper thigh. Review of the MIP images confirms the above findings. NON-VASCULAR Hepatobiliary: Limited assessment of the liver without  acute process or visible lesion. Pancreas: Normal, without mass, inflammation or ductal dilatation. Spleen: Spleen normal size and contour. Adrenals/Urinary Tract: Adrenal glands are unremarkable. Symmetric renal enhancement. No sign of hydronephrosis. No suspicious renal lesion or perinephric stranding. Urinary bladder is grossly unremarkable. Stomach/Bowel: No acute gastrointestinal findings. The appendix is normal. Sigmoid diverticulosis. Lymphatic: There is no gastrohepatic or hepatoduodenal ligament lymphadenopathy. No retroperitoneal or mesenteric lymphadenopathy. No pelvic sidewall lymphadenopathy. Reproductive: Unremarkable by CT. Other: No ascites.  No free air. Musculoskeletal: No acute bone finding. No destructive bone process. Spinal degenerative changes. Review of the MIP images confirms the above findings. IMPRESSION: No signs of acute aortic process. Septal thickening and small bilateral pleural effusions with ground-glass favored to represent sequela of heart failure or volume overload. Given patchy appearance of ground-glass in some areas would consider the possibility of superimposed viral or atypical process. Bilateral areas of sub solid nodularity and a single solid RIGHT lower lobe pulmonary nodule. In the current context would consider a 3 month follow-up to assess for signs of resolution or persistence with additional follow-up to be predicated on the most suspicious remaining finding. Cardiomegaly and signs of coronary artery disease, three-vessel disease. Mild thickening of the esophagus, correlate with signs of esophagitis. Pulmonary emphysema. Aortic atherosclerosis. Aortic Atherosclerosis (ICD10-I70.0) and Emphysema (ICD10-J43.9). Electronically Signed   By: Zetta Bills M.D.   On: 03/09/2021 09:27    EKG: See HPI   ASSESSMENT AND PLAN:   CAD:  previous stent to RCA complicated by VF arrest 2014 with stent to mid circumflex continued into OM2 10/07/19 Continue ASA/Brilinta and  statin Recent admission with HTN urgency and pulmonary edema and new lateral T wave changes despite R/O feel he should have right and left cath to make sure completely revascularized Willing to proceed Pre cath labs today  Ischemic DCM  EF 30-35% needs to make sure revascularization stable and complete On lasix Toprol and losartan Post cath change to entresto and add aldactone Cardiac MRI after heart cath  and med changes to risk stratify for AICD  HTN:  see changes above   HLD most recent LDL 74 update labs with primary continue statin  Smoking :  counseled on cessation He mentioned vaping and I told him that was worse than cigarettes    Right and left cath Pre cath labs Cardiac MRI post cath to risk stratify for AICD  F/U Dr Harl Bowie after cath titrate meds consider change to entresto and adding aldactone  Signed: Jenkins Rouge 03/14/2021, 2:31 PM

## 2021-03-11 NOTE — H&P (View-Only) (Signed)
CARDIOLOGY CONSULT NOTE       Patient ID: Timothy Walker MRN: [id number] DOB/AGE: [birth year] 54 y.o.  Admit date: (Not on file) Referring Physician: [place] Primary Physician: Patient, [name] Per (Inactive) Primary Cardiologist: [facility] Reason for Consultation: CHF/HTN/Pulmonary Edema     HPI:  55 y.o. referred TOC/Post hospital by Dr Denton Brick for HTN urgency, pulmonary edema and.  Patient of Dr Harl Bowie He is a smoker with COPD and known CAD with ischemic DCM.  Admitted 03/09/21 with HTN crisis and pulmonary edema. R/O no chest pain CXR with cephalization no effusions TTE with EF 30-35% inferior RWMA mild MR normal RV severe LAE.    CAD history includes RCA stent with STEMI and VF arrest 2014.  Had cath and lesion in circumflex not intevened on 10/16/18 But had SEMI and stent to mid circ/OM2 continuation on 10/07/19  D/c after diuresis in 48 hours from admission R/O BNP only 898 ECG With SR LVH and new lateral T wave changes in V4-6  Since d/c has not taken norvasc. And BP a bit high but improved in office Breathing better Long discussion about stopping smoking and significance of pulmonary edema. I also did not like his ECG with lateral T wave inversions   Discussed repeat cath this week to make sure stents patient and then f/u with Dr Harl Bowie to  Optimize meds and do MRI in 2-3 months to risk stratify for AICD  He works at [employer] and I told him should do cath before he goes back to work   ROS All other systems reviewed and negative except as noted above  Past Medical History:  Diagnosis Date   COPD (chronic obstructive pulmonary disease) ([place])    Coronary artery disease    a. 12/2012 Inf STEMI/Cath/PCI: LM nl, LAD nl, D1 50ost, D2 min irregs, D3 small, LCX 80-35m, OM1/2/3 min irregs, RI 20p, RCA 50p/100d (3.5x18 Xience DEs),PDA/PL nl, EF 26% - complicated by VF/CGS/IABP/VDRF   GERD (gastroesophageal reflux disease)    Hiatal hernia    Hypertension    Ischemic  cardiomyopathy    a. 12/2012 Echo: EF 45-50%, basal inf, inflat, mid inf HK, mildly reduced RV fxn.   Marijuana abuse    MI (myocardial infarction) ([place]) 2014   Obstructive sleep apnea    Tobacco abuse     Family History  Problem Relation Age of Onset   Heart attack Other    Hypertension Other    Colon cancer Neg Hx     Social History   Socioeconomic History   Marital status: Divorced    Spouse name: Not on file   Number of children: Not on file   Years of education: Not on file   Highest education level: Not on file  Occupational History   Not on file  Tobacco Use   Smoking status: Every Day    Packs/day: 0.75    Years: 31.00    Pack years: 23.25    Types: Cigarettes    Start date: 11/21/1981   Smokeless tobacco: Never  Vaping Use   Vaping Use: Some days   Substances: Nicotine, CBD  Substance and Sexual Activity   Alcohol use: Yes    Alcohol/week: 2.0 standard drinks    Types: 1 Cans of beer, 1 Shots of liquor per week    Comment: 3-5 shots of liquor daily   Drug use: Yes    Types: Marijuana    Comment: last night   Sexual activity: Yes  Other Topics Concern  Not on file  Social History Narrative   Not on file   Social Determinants of Health   Financial Resource Strain: Not on file  Food Insecurity: Not on file  Transportation Needs: Not on file  Physical Activity: Not on file  Stress: Not on file  Social Connections: Not on file  Intimate Partner Violence: Not on file    Past Surgical History:  Procedure Laterality Date   COLONOSCOPY WITH PROPOFOL N/A 05/14/2014   Dr. Gala Romney: anal canal hemorrhoid, rectosigmoid hyerpplastic polyp, right-sided divetriculosis    CORONARY ANGIOPLASTY  01/13/2013   STENT TO RCA BY DR Burt Knack   CORONARY STENT INTERVENTION N/A 10/07/2019   Procedure: CORONARY STENT INTERVENTION;  Surgeon: Leonie Man, MD;  Location: [facility];  Service: Cardiovascular;  Laterality: N/A;   CORONARY/GRAFT ACUTE MI  REVASCULARIZATION N/A 10/07/2019   Procedure: Coronary/Graft Acute MI Revascularization;  Surgeon: Leonie Man, MD;  Location: [facility];  Service: Cardiovascular;  Laterality: N/A;   DENTAL SURGERY     ESOPHAGEAL DILATION N/A 05/14/2014   Procedure: ESOPHAGEAL DILATION [place];  Surgeon: Daneil Dolin, MD;  Location: [facility];  Service: Endoscopy;  Laterality: N/A;   ESOPHAGOGASTRODUODENOSCOPY (EGD) WITH PROPOFOL N/A 05/14/2014   Dr. Gala Romney: empiric dilation, abnormal esophagus s/p biopsy (normal), small hiatal hernia   LEFT HEART CATH AND CORONARY ANGIOGRAPHY N/A 09/26/2018   Procedure: LEFT HEART CATH AND CORONARY ANGIOGRAPHY;  Surgeon: Lorretta Harp, MD;  Location: [facility];  Service: Cardiovascular;  Laterality: N/A;   LEFT HEART CATH AND CORONARY ANGIOGRAPHY N/A 10/07/2019   Procedure: LEFT HEART CATH AND CORONARY ANGIOGRAPHY;  Surgeon: Leonie Man, MD;  Location: [facility];  Service: Cardiovascular;  Laterality: N/A;   LEFT HEART CATHETERIZATION WITH CORONARY ANGIOGRAM N/A 01/13/2013   Procedure: LEFT HEART CATHETERIZATION WITH CORONARY ANGIOGRAM;  Surgeon: Wellington Hampshire, MD;  Location: [facility];  Service: Cardiovascular;  Laterality: N/A;   NASAL SEPTUM SURGERY     PERCUTANEOUS CORONARY STENT INTERVENTION (PCI-S)  01/13/2013   Procedure: PERCUTANEOUS CORONARY STENT INTERVENTION (PCI-S);  Surgeon: Wellington Hampshire, MD;  Location: [facility];  Service: Cardiovascular;;   POLYPECTOMY  05/14/2014   Procedure: POLYPECTOMY;  Surgeon: Daneil Dolin, MD;  Location: [facility];  Service: Endoscopy;;      Current Outpatient Medications:    albuterol (PROVENTIL) (2.5 MG/3ML) 0.083% nebulizer solution, Take 3 mLs (2.5 mg total) by nebulization every 2 (two) hours as needed for wheezing or shortness of breath., Disp: 75 mL, Rfl: 12   albuterol (VENTOLIN HFA) 108 (90 Base) MCG/ACT inhaler, Inhale 2 puffs into the lungs every 4 (four) hours as needed for  wheezing or shortness of breath., Disp: 18 g, Rfl: 1   amLODipine (NORVASC) 10 MG tablet, Take 1 tablet (10 mg total) by mouth daily., Disp: 90 tablet, Rfl: 3   aspirin EC 81 MG EC tablet, Take 1 tablet (81 mg total) by mouth daily with breakfast. Swallow whole., Disp: 30 tablet, Rfl: 11   atorvastatin (LIPITOR) 80 MG tablet, Take 1 tablet (80 mg total) by mouth daily., Disp: 90 tablet, Rfl: 5   azithromycin (ZITHROMAX) 500 MG tablet, Take 1 tablet (500 mg total) by mouth daily for 5 days., Disp: 5 tablet, Rfl: 0   furosemide (LASIX) 20 MG tablet, Take 1 tablet (20 mg total) by mouth daily., Disp: 30 tablet, Rfl: 11   guaiFENesin (MUCINEX) 600 MG 12 hr tablet, Take 1  tablet (600 mg total) by mouth 2 (two) times daily., Disp: 20 tablet, Rfl: 0   losartan (COZAAR) 100 MG tablet, Take 1 tablet (100 mg total) by mouth daily., Disp: 90 tablet, Rfl: 5   metoprolol succinate (TOPROL-XL) 100 MG 24 hr tablet, Take 1 tablet (100 mg total) by mouth daily. Take with or immediately following a meal., Disp: 90 tablet, Rfl: 3   nitroGLYCERIN (NITROSTAT) 0.4 MG SL tablet, Place 1 tablet (0.4 mg total) under the tongue every 5 (five) minutes as needed for chest pain., Disp: 25 tablet, Rfl: 3   pantoprazole (PROTONIX) 40 MG tablet, Take by mouth., Disp: , Rfl:    potassium chloride (KLOR-CON) 10 MEQ tablet, Take 1 tablet (10 mEq total) by mouth daily. Take While taking Lasix/furosemide, Disp: 30 tablet, Rfl: 2   predniSONE (DELTASONE) 20 MG tablet, Take 2 tablets (40 mg total) by mouth daily with breakfast for 5 days., Disp: 10 tablet, Rfl: 0   ticagrelor (BRILINTA) 90 MG TABS tablet, Take 1 tablet (90 mg total) by mouth 2 (two) times daily., Disp: 180 tablet, Rfl: 1   umeclidinium bromide (INCRUSE ELLIPTA) 62.5 MCG/ACT AEPB, Inhale 1 puff into the lungs daily., Disp: 1 each, Rfl: 2   nicotine (NICODERM CQ - DOSED IN MG/24 HOURS) 21 mg/24hr patch, Place 1 patch (21 mg total) onto the skin daily. (Patient not taking:  Reported on 03/14/2021), Disp: 28 patch, Rfl: 0    Physical Exam: Blood pressure (!) 140/96, pulse 80, height 5\' 11"  (1.803 m), weight 188 lb (85.3 kg), SpO2 93 %.    Affect appropriate Healthy:  appears stated age 89: normal Neck supple with no adenopathy JVP normal no bruits no thyromegaly Lungs clear with no wheezing and good diaphragmatic motion Heart:  S1/S2 no murmur, no rub, gallop or click PMI normal Abdomen: benighn, BS positve, no tenderness, no AAA no bruit.  No HSM or HJR Distal pulses intact with no bruits No edema Neuro non-focal Skin warm and dry No muscular weakness   Labs:   Lab Results  Component Value Date   WBC 14.7 (H) 03/10/2021   HGB 15.1 03/10/2021   HCT 44.4 03/10/2021   MCV 102.1 (H) 03/10/2021   PLT 262 03/10/2021    Recent Labs  Lab 03/09/21 0811 03/10/21 0720  NA 141 136  K 3.6 3.5  CL 104 100  CO2 26 22  BUN 13 15  CREATININE 0.94 0.80  CALCIUM 9.0 9.6  PROT 7.8  --   BILITOT 0.4  --   ALKPHOS 63  --   ALT 18  --   AST 28  --   GLUCOSE 205* 171*   Lab Results  Component Value Date   CKTOTAL 9,032 (H) 01/14/2013   CKMB 128.7 (HH) 01/14/2013   TROPONINI <0.30 06/10/2013    Lab Results  Component Value Date   CHOL 146 10/09/2019   CHOL 131 09/26/2018   CHOL 148 01/13/2013   Lab Results  Component Value Date   HDL 39 (L) 10/09/2019   HDL 29 (L) 09/26/2018   HDL 31 (L) 01/13/2013   Lab Results  Component Value Date   LDLCALC 74 10/09/2019   LDLCALC 57 09/26/2018   LDLCALC 81 01/13/2013   Lab Results  Component Value Date   TRIG 165 (H) 10/09/2019   TRIG 227 (H) 09/26/2018   TRIG 178 (H) 01/13/2013   Lab Results  Component Value Date   CHOLHDL 3.7 10/09/2019   CHOLHDL 4.5 09/26/2018  CHOLHDL 4.8 01/13/2013   No results found for: LDLDIRECT    Radiology: DG Chest Portable 1 View  Result Date: 03/09/2021 CLINICAL DATA:  Chest pain, shortness of breath chest pain that started this morning on the way  to work. EXAM: PORTABLE CHEST 1 VIEW COMPARISON:  December 27, 2020. FINDINGS: EKG leads project over the chest. Trachea midline. Cardiomediastinal contours and hilar structures are stable. Increased interstitial markings are noted throughout the chest, a change from previous imaging. No lobar consolidative process. Subtle patchy opacities at the lung bases, for instance in the LEFT mid chest. No pneumothorax. On limited assessment there is no acute skeletal process. IMPRESSION: Increased interstitial markings throughout the chest, a change from previous imaging. Findings could represent atypical infection or edema. Electronically Signed   By: Zetta Bills M.D.   On: 03/09/2021 08:13   ECHOCARDIOGRAM COMPLETE  Result Date: 03/09/2021    ECHOCARDIOGRAM REPORT   Patient Name:   Timothy Walker Date of Exam: 03/09/2021 Medical Rec #:  [id number]      Height:       71.0 in Accession #:    [id number]     Weight:       185.0 lb Date of Birth:  [birth year]      BSA:          2.040 m Patient Age:    37 years       BP:           162/110 mmHg Patient Gender: M              HR:           68 bpm. Exam Location:  [facility] Procedure: 2D Echo, Cardiac Doppler and Color Doppler Indications:    Dyspnea R06.00  History:        Patient has prior history of Echocardiogram examinations, most                 recent 10/08/2019. Previous Myocardial Infarction and CAD, COPD,                 Arrythmias:PVC; Risk Factors:Hypertension and Dyslipidemia.                 Alcohol use, Cardiogenic shock ([place]), NSTEMI (non-ST elevated                 myocardial infarction) ([place]), ST elevation myocardial infarction                 (STEMI) of inferior wall, initial episode of care [facility]),                 Ischemic cardiomyopathy (From Hx), Obstructive sleep apnea (From                 Hx).  Sonographer:    Alvino Chapel RCS Referring Phys: [phone] COURAGE EMOKPAE IMPRESSIONS  1. Left ventricular ejection fraction, by estimation, is 30 to 35%. The left  ventricle has moderately decreased function. The left ventricle demonstrates regional wall motion abnormalities (see scoring diagram/findings for description). The basal-to-mid  inferior wall is akinetic. The rest of the LV segments are hypokinetic. The left ventricular internal cavity size was moderately dilated. There is mild concentric left ventricular hypertrophy. Left ventricular diastolic parameters are consistent with Grade II diastolic dysfunction (pseudonormalization).  2. Right ventricular systolic function is normal. The right ventricular size is normal. Tricuspid regurgitation signal is inadequate for assessing PA pressure.  3. Left atrial size was severely dilated.  4. The mitral valve is normal in structure. Mild mitral valve regurgitation.  5. The aortic valve is tricuspid. There is mild calcification of the aortic valve. There is mild thickening of the aortic valve. Aortic valve regurgitation is not visualized. Aortic valve sclerosis/calcification is present, without any evidence of aortic stenosis.  6. Aortic dilatation noted. There is mild dilatation of the aortic root, measuring 38 mm.  7. The inferior vena cava is normal in size with greater than 50% respiratory variability, suggesting right atrial pressure of 3 mmHg. Comparison(s): Compared to prior TTE in 2020, the LVEF has dropped from about 45% to 35%. FINDINGS  Left Ventricle: Left ventricular ejection fraction, by estimation, is 30 to 35%. The left ventricle has moderately decreased function. The left ventricle demonstrates regional wall motion abnormalities. The basal-to-mid inferior wall appears akinetic. The rest of the LV segments appear hypokinetic. The left ventricular internal cavity size was moderately dilated. There is mild concentric left ventricular hypertrophy. Left ventricular diastolic parameters are consistent with Grade II diastolic dysfunction (pseudonormalization). Right Ventricle: The right ventricular size is normal. No  increase in right ventricular wall thickness. Right ventricular systolic function is normal. Tricuspid regurgitation signal is inadequate for assessing PA pressure. Left Atrium: Left atrial size was severely dilated. Right Atrium: Right atrial size was normal in size. Pericardium: There is no evidence of pericardial effusion. Mitral Valve: The mitral valve is normal in structure. Mild mitral valve regurgitation. Tricuspid Valve: The tricuspid valve is normal in structure. Tricuspid valve regurgitation is trivial. Aortic Valve: The aortic valve is tricuspid. There is mild calcification of the aortic valve. There is mild thickening of the aortic valve. Aortic valve regurgitation is not visualized. Aortic valve sclerosis/calcification is present, without any evidence of aortic stenosis. Pulmonic Valve: The pulmonic valve was normal in structure. Pulmonic valve regurgitation is trivial. Aorta: Aortic dilatation noted. There is mild dilatation of the aortic root, measuring 38 mm. Venous: The inferior vena cava is normal in size with greater than 50% respiratory variability, suggesting right atrial pressure of 3 mmHg. IAS/Shunts: No atrial level shunt detected by color flow Doppler.  LEFT VENTRICLE PLAX 2D LVIDd:         6.80 cm      Diastology LVIDs:         6.50 cm      LV e' medial:    5.87 cm/s LV PW:         0.90 cm      LV E/e' medial:  11.6 LV IVS:        1.50 cm      LV e' lateral:   9.14 cm/s LVOT diam:     2.30 cm      LV E/e' lateral: 7.4 LV SV:         74 LV SV Index:   36 LVOT Area:     4.15 cm  LV Volumes (MOD) LV vol d, MOD A2C: 243.0 ml LV vol d, MOD A4C: 233.0 ml LV vol s, MOD A2C: 215.0 ml LV vol s, MOD A4C: 131.0 ml LV SV MOD A2C:     28.0 ml LV SV MOD A4C:     233.0 ml LV SV MOD BP:      84.0 ml RIGHT VENTRICLE RV S prime:     10.20 cm/s TAPSE (M-mode): 1.9 cm LEFT ATRIUM              Index        RIGHT ATRIUM  Index LA diam:        3.80 cm  1.86 cm/m   RA Area:     19.70 cm LA Vol (A2C):    117.0 ml 57.36 ml/m  RA Volume:   54.90 ml  26.92 ml/m LA Vol (A4C):   85.8 ml  42.07 ml/m LA Biplane Vol: 100.0 ml 49.03 ml/m  AORTIC VALVE LVOT Vmax:   82.10 cm/s LVOT Vmean:  61.800 cm/s LVOT VTI:    0.177 m  AORTA Ao Root diam: 3.80 cm MITRAL VALVE MV Area (PHT): 3.17 cm    SHUNTS MV Decel Time: 239 msec    Systemic VTI:  0.18 m MV E velocity: 67.80 cm/s  Systemic Diam: 2.30 cm MV A velocity: 55.80 cm/s MV E/A ratio:  1.22 Gwyndolyn Kaufman MD Electronically signed by Gwyndolyn Kaufman MD Signature Date/Time: 03/09/2021/2:40:23 PM    Final    CT Angio Chest/Abd/Pel for Dissection W and/or Wo Contrast  Result Date: 03/09/2021 CLINICAL DATA:  A 55 year old male presents with chest pain, back pain and suspected aortic dissection. EXAM: CT ANGIOGRAPHY CHEST, ABDOMEN AND PELVIS TECHNIQUE: Non-contrast CT of the chest was initially obtained. Multidetector CT imaging through the chest, abdomen and pelvis was performed using the standard protocol during bolus administration of intravenous contrast. Multiplanar reconstructed images and MIPs were obtained and reviewed to evaluate the vascular anatomy. CONTRAST:  1105mL OMNIPAQUE IOHEXOL 350 MG/ML SOLN COMPARISON:  Chest x-ray of March 09, 2021. FINDINGS: CTA CHEST FINDINGS Cardiovascular: Noncontrast imaging of the chest with calcified atheromatous changes and signs of coronary artery disease, three-vessel disease. No signs of intramural hematoma of the thoracic aorta. Thoracic aorta with noncalcified plaque as well. 3.6 cm caliber of the ascending thoracic aorta. No acute aortic process in the chest. Moderate cardiac enlargement without substantial pericardial effusion. Central pulmonary vasculature normal caliber. Mediastinum/Nodes: Scattered small lymph nodes throughout the chest none with pathologic enlargement largest in the range of 11 mm. Mild thickening of the esophagus is diffuse. No adjacent stranding. Lungs/Pleura: Sub solid nodule in the RIGHT  upper lobe 14 x 16 mm (image 53/7) Sub solid nodular opacity in the RIGHT upper lobe (image 38/7) 9 mm. Mild bronchial wall thickening throughout the chest. Septal thickening more pronounced at the lung bases. Ground-glass attenuation with geographic pattern more pronounced in the RIGHT lower lobe. Discrete ground-glass nodule in the RIGHT lower lobe (image 83/7) 9 x 7 mm. Small RIGHT lower lobe pulmonary nodule (image 83/7) 6 mm. Small ground-glass nodule in the LEFT upper lobe (image 70/7) 8 mm. Moderate paraseptal predominant pulmonary emphysema that is worse at the lung apices. Very small bilateral pleural effusions. Musculoskeletal: No acute bone finding. No destructive bone process. Spinal degenerative changes. Review of the MIP images confirms the above findings. CTA ABDOMEN AND PELVIS FINDINGS VASCULAR Aorta: Calcified and noncalcified atheromatous plaque in the abdominal aorta. No signs of adjacent stranding or dissection. No aneurysmal dilation. Celiac: Celiac with classic anatomy. No signs of dissection, vasculitis or dilation. SMA: Patent superior mesenteric artery, widely patent with minimal atherosclerotic changes. No acute process related to the SMA. Renals: Single renal artery with early bifurcation on the RIGHT. No signs of narrowing or acute process. Minimal atherosclerotic changes. Accessory renal artery to the lower pole of the LEFT kidney. Renal vasculature on the LEFT is patent. No signs of acute process. IMA: IMA is patent without acute finding. Inflow: Patent without evidence of aneurysm, dissection, vasculitis or significant stenosis. Veins: Venous structures with normal caliber, limited assessment on  the arterial phase. Proximal outflow: Runoff into the proximal thigh without signs of substantial narrowing or dilation. Contrast passes into vessels in the upper thigh. Review of the MIP images confirms the above findings. NON-VASCULAR Hepatobiliary: Limited assessment of the liver without  acute process or visible lesion. Pancreas: Normal, without mass, inflammation or ductal dilatation. Spleen: Spleen normal size and contour. Adrenals/Urinary Tract: Adrenal glands are unremarkable. Symmetric renal enhancement. No sign of hydronephrosis. No suspicious renal lesion or perinephric stranding. Urinary bladder is grossly unremarkable. Stomach/Bowel: No acute gastrointestinal findings. The appendix is normal. Sigmoid diverticulosis. Lymphatic: There is no gastrohepatic or hepatoduodenal ligament lymphadenopathy. No retroperitoneal or mesenteric lymphadenopathy. No pelvic sidewall lymphadenopathy. Reproductive: Unremarkable by CT. Other: No ascites.  No free air. Musculoskeletal: No acute bone finding. No destructive bone process. Spinal degenerative changes. Review of the MIP images confirms the above findings. IMPRESSION: No signs of acute aortic process. Septal thickening and small bilateral pleural effusions with ground-glass favored to represent sequela of heart failure or volume overload. Given patchy appearance of ground-glass in some areas would consider the possibility of superimposed viral or atypical process. Bilateral areas of sub solid nodularity and a single solid RIGHT lower lobe pulmonary nodule. In the current context would consider a 3 month follow-up to assess for signs of resolution or persistence with additional follow-up to be predicated on the most suspicious remaining finding. Cardiomegaly and signs of coronary artery disease, three-vessel disease. Mild thickening of the esophagus, correlate with signs of esophagitis. Pulmonary emphysema. Aortic atherosclerosis. Aortic Atherosclerosis (ICD10-I70.0) and Emphysema (ICD10-J43.9). Electronically Signed   By: Zetta Bills M.D.   On: 03/09/2021 09:27    EKG: See HPI   ASSESSMENT AND PLAN:   CAD:  previous stent to RCA complicated by VF arrest 2014 with stent to mid circumflex continued into OM2 10/07/19 Continue ASA/Brilinta and  statin Recent admission with HTN urgency and pulmonary edema and new lateral T wave changes despite R/O feel he should have right and left cath to make sure completely revascularized Willing to proceed Pre cath labs today  Ischemic DCM  EF 30-35% needs to make sure revascularization stable and complete On lasix Toprol and losartan Post cath change to entresto and add aldactone Cardiac MRI after heart cath  and med changes to risk stratify for AICD  HTN:  see changes above   HLD most recent LDL 74 update labs with primary continue statin  Smoking :  counseled on cessation He mentioned vaping and I told him that was worse than cigarettes    Right and left cath Pre cath labs Cardiac MRI post cath to risk stratify for AICD  F/U Dr Harl Bowie after cath titrate meds consider change to entresto and adding aldactone  Signed: Jenkins Rouge 03/14/2021, 2:31 PM

## 2021-03-14 ENCOUNTER — Other Ambulatory Visit (HOSPITAL_COMMUNITY)
Admission: RE | Admit: 2021-03-14 | Discharge: 2021-03-14 | Disposition: A | Discharge status: Home / Self Care | Payer: BC Managed Care – PPO | Source: Ambulatory Visit | Attending: Cardiovascular Disease | Admitting: Cardiovascular Disease

## 2021-03-14 ENCOUNTER — Other Ambulatory Visit: Payer: Self-pay | Admitting: Cardiovascular Disease

## 2021-03-14 ENCOUNTER — Other Ambulatory Visit: Payer: Self-pay

## 2021-03-14 ENCOUNTER — Encounter: Payer: Self-pay | Admitting: *Deleted

## 2021-03-14 ENCOUNTER — Ambulatory Visit: Payer: BC Managed Care – PPO | Admitting: Cardiovascular Disease

## 2021-03-14 ENCOUNTER — Encounter: Payer: Self-pay | Admitting: Cardiovascular Disease

## 2021-03-14 VITALS — BP 140/96 | HR 80 | Ht 71.0 in | Wt 188.0 lb

## 2021-03-14 DIAGNOSIS — I5042 Chronic combined systolic (congestive) and diastolic (congestive) heart failure: Secondary | ICD-10-CM | POA: Diagnosis present

## 2021-03-14 DIAGNOSIS — I10 Essential (primary) hypertension: Secondary | ICD-10-CM

## 2021-03-14 DIAGNOSIS — I5022 Chronic systolic (congestive) heart failure: Secondary | ICD-10-CM | POA: Diagnosis not present

## 2021-03-14 DIAGNOSIS — I255 Ischemic cardiomyopathy: Secondary | ICD-10-CM

## 2021-03-14 DIAGNOSIS — J810 Acute pulmonary edema: Secondary | ICD-10-CM

## 2021-03-14 DIAGNOSIS — F17210 Nicotine dependence, cigarettes, uncomplicated: Secondary | ICD-10-CM | POA: Diagnosis not present

## 2021-03-14 DIAGNOSIS — J449 Chronic obstructive pulmonary disease, unspecified: Secondary | ICD-10-CM | POA: Diagnosis not present

## 2021-03-14 DIAGNOSIS — Z955 Presence of coronary angioplasty implant and graft: Secondary | ICD-10-CM | POA: Diagnosis not present

## 2021-03-14 DIAGNOSIS — I110 Hypertensive heart disease with heart failure: Secondary | ICD-10-CM | POA: Diagnosis not present

## 2021-03-14 DIAGNOSIS — I2510 Atherosclerotic heart disease of native coronary artery without angina pectoris: Secondary | ICD-10-CM | POA: Diagnosis not present

## 2021-03-14 LAB — BASIC METABOLIC PANEL
Anion gap: 10 (ref 5–15)
BUN: 16 mg/dL (ref 6–20)
CO2: 26 mmol/L (ref 22–32)
Calcium: 9.4 mg/dL (ref 8.9–10.3)
Chloride: 101 mmol/L (ref 98–111)
Creatinine, Ser: 0.89 mg/dL (ref 0.61–1.24)
GFR, Estimated: >60 mL/min (ref >60)
Glucose, Bld: 106 mg/dL — ABNORMAL HIGH (ref 70–99)
Potassium: 3.9 mmol/L (ref 3.5–5.1)
Sodium: 137 mmol/L (ref 135–145)

## 2021-03-14 LAB — CBC
HCT: 48.8 % (ref 39.0–52.0)
Hemoglobin: 16.9 g/dL (ref 13.0–17.0)
MCH: 35.6 pg — ABNORMAL HIGH (ref 26.0–34.0)
MCHC: 34.6 g/dL (ref 30.0–36.0)
MCV: 102.7 fL — ABNORMAL HIGH (ref 80.0–100.0)
Platelets: 282 10*3/uL (ref 150–400)
RBC: 4.75 MIL/uL (ref 4.22–5.81)
RDW: 13.1 % (ref 11.5–15.5)
WBC: 11.8 10*3/uL — ABNORMAL HIGH (ref 4.0–10.5)
nRBC: 0 % (ref 0.0–0.2)

## 2021-03-14 MED ORDER — SODIUM CHLORIDE 0.9% FLUSH: 3.0000 mL | Freq: Two times a day (BID) | INTRAVENOUS | Status: DC

## 2021-03-14 NOTE — Patient Instructions (Signed)
Medication Instructions:  Your physician recommends that you continue on your current medications as directed. Please refer to the Current Medication list given to you today.  *If you need a refill on your cardiac medications before your next appointment, please call your pharmacy*   Lab Work: Your physician recommends that you return for lab work in: Today ( BMET, CBC)   If you have labs (blood work) drawn today and your tests are completely normal, you will receive your results only by: MyChart Message (if you have MyChart) OR A paper copy in the mail If you have any lab test that is abnormal or we need to change your treatment, we will call you to review the results.   Testing/Procedures: Your physician has requested that you have a cardiac catheterization. Cardiac catheterization is used to diagnose and/or treat various heart conditions. Doctors may recommend this procedure for a number of different reasons. The most common reason is to evaluate chest pain. Chest pain can be a symptom of coronary artery disease (CAD), and cardiac catheterization can show whether plaque is narrowing or blocking your hearts arteries. This procedure is also used to evaluate the valves, as well as measure the blood flow and oxygen levels in different parts of your heart. For further information please visit [email or web address]. Please follow instruction sheet, as given.    Follow-Up: At [facility] you and your health needs are our priority.  As part of our continuing mission to provide you with exceptional heart care, we have created designated Provider Care Teams.  These Care Teams include your primary Cardiologist (physician) and Advanced Practice Providers (APPs -  Physician Assistants and Nurse Practitioners) who all work together to provide you with the care you need, when you need it.  We recommend signing up for the patient portal called "MyChart".  Sign up information is provided on this After  Visit Summary.  MyChart is used to connect with patients for Virtual Visits (Telemedicine).  Patients are able to view lab/test results, encounter notes, upcoming appointments, etc.  Non-urgent messages can be sent to your provider as well.   To learn more about what you can do with MyChart, go to [email or web address].    Your next appointment:   3 -4 week(s)  The format for your next appointment:   In Person  Provider:   Carlyle Dolly, MD    Other Instructions Thank you for choosing [facility] [place]!     [place] [address] Dept: [phone] Loc: [phone]  AAMARI STRAWDERMAN  03/14/2021  You are scheduled for a Cardiac Catheterization on Wednesday, December 21 with Dr. Peter Martinique.  1. Please arrive at the [facility] (Main Entrance A) at [facility]: [address] [place], [place] [address] at 9:30 AM (This time is two hours before your procedure to ensure your preparation). Free valet parking service is available.   Special note: Every effort is made to have your procedure done on time. Please understand that emergencies sometimes delay scheduled procedures.  2. Diet: Do not eat solid foods after midnight.  The patient may have clear liquids until 5am upon the day of the procedure.  3. Labs: You will need to have blood drawn on Monday, December 19 at [facility], [place]  Open: 7am - 6pm, Sat 8am - 12 noon   Phone: [phone]. You do not need to be fasting.  4.  Medication instructions in preparation for your procedure:   Contrast Allergy: No  On the morning of your procedure, take your Brilinta/Ticagrelor and any morning medicines NOT listed above.  You may use sips of water.  5. Plan for one night stay--bring personal belongings. 6. Bring a current list of your medications and current insurance cards. 7. You MUST have a  responsible person to drive you home. 8. Someone MUST be with you the first 24 hours after you arrive home or your discharge will be delayed. 9. Please wear clothes that are easy to get on and off and wear slip-on shoes.  Thank you for allowing [place] to care for you!   -- [facility]

## 2021-03-15 ENCOUNTER — Telehealth: Payer: Self-pay | Admitting: *Deleted

## 2021-03-15 NOTE — Telephone Encounter (Signed)
Reviewed procedure/mask/visitor instructions with patient. 

## 2021-03-15 NOTE — Telephone Encounter (Signed)
Patient is returning call to discuss procedure instructions.

## 2021-03-15 NOTE — Telephone Encounter (Signed)
Left message for patient to call back to review instructions.

## 2021-03-15 NOTE — Telephone Encounter (Signed)
Cardiac catheterization scheduled at [facility] for: Wednesday March 16, 2021 11:30 [place] Entrance A [facility]) at: 9:30 AM   Diet-no solid food after midnight prior to cath, clear liquids until 5 AM day of procedure.  Medication instructions for procedure: -Hold:  Lasix/KCl-AM of procedure -Except hold medications usual morning medications can be taken pre-cath with sips of water including aspirin 81 mg and Brilinta 90 mg    Confirmed patient has responsible adult to drive home post procedure and be with patient first 24 hours after arriving home.  [facility] does allow one visitor to accompany you and wait in the hospital waiting room while you are there for your procedure. You and your visitor will be asked to wear a mask once you enter the hospital.   Patient reports does not currently have any new symptoms concerning for COVID-19 and no household members with COVID-19 like illness.     Left message to call back to review procedure instructions with patient.

## 2021-03-16 ENCOUNTER — Other Ambulatory Visit: Payer: Self-pay

## 2021-03-16 ENCOUNTER — Encounter (HOSPITAL_COMMUNITY): Payer: Self-pay | Admitting: Cardiology

## 2021-03-16 ENCOUNTER — Ambulatory Visit (HOSPITAL_COMMUNITY)
Admission: RE | Admit: 2021-03-16 | Discharge: 2021-03-16 | Disposition: A | Discharge status: Home / Self Care | Payer: BC Managed Care – PPO | Attending: Cardiology | Admitting: Cardiology

## 2021-03-16 ENCOUNTER — Encounter (HOSPITAL_COMMUNITY)
Admission: RE | Disposition: A | Discharge status: Home / Self Care | Payer: Self-pay | Source: Home / Self Care | Attending: Cardiology

## 2021-03-16 DIAGNOSIS — I2510 Atherosclerotic heart disease of native coronary artery without angina pectoris: Secondary | ICD-10-CM

## 2021-03-16 DIAGNOSIS — I110 Hypertensive heart disease with heart failure: Secondary | ICD-10-CM | POA: Insufficient documentation

## 2021-03-16 DIAGNOSIS — Z720 Tobacco use: Secondary | ICD-10-CM | POA: Diagnosis present

## 2021-03-16 DIAGNOSIS — I5042 Chronic combined systolic (congestive) and diastolic (congestive) heart failure: Secondary | ICD-10-CM | POA: Diagnosis not present

## 2021-03-16 DIAGNOSIS — E785 Hyperlipidemia, unspecified: Secondary | ICD-10-CM | POA: Diagnosis present

## 2021-03-16 DIAGNOSIS — I10 Essential (primary) hypertension: Secondary | ICD-10-CM | POA: Diagnosis present

## 2021-03-16 DIAGNOSIS — J449 Chronic obstructive pulmonary disease, unspecified: Secondary | ICD-10-CM | POA: Insufficient documentation

## 2021-03-16 DIAGNOSIS — Z955 Presence of coronary angioplasty implant and graft: Secondary | ICD-10-CM | POA: Insufficient documentation

## 2021-03-16 DIAGNOSIS — F17210 Nicotine dependence, cigarettes, uncomplicated: Secondary | ICD-10-CM | POA: Insufficient documentation

## 2021-03-16 DIAGNOSIS — I255 Ischemic cardiomyopathy: Secondary | ICD-10-CM | POA: Insufficient documentation

## 2021-03-16 DIAGNOSIS — I493 Ventricular premature depolarization: Secondary | ICD-10-CM | POA: Diagnosis present

## 2021-03-16 HISTORY — PX: RIGHT/LEFT HEART CATH AND CORONARY ANGIOGRAPHY: CATH118266

## 2021-03-16 LAB — POCT I-STAT EG7
Acid-Base Excess: 1 mmol/L (ref 0.0–2.0)
Acid-Base Excess: 2 mmol/L (ref 0.0–2.0)
Bicarbonate: 26.8 mmol/L (ref 20.0–28.0)
Bicarbonate: 27.1 mmol/L (ref 20.0–28.0)
Calcium, Ion: 1.24 mmol/L (ref 1.15–1.40)
Calcium, Ion: 1.26 mmol/L (ref 1.15–1.40)
HCT: 45 % (ref 39.0–52.0)
HCT: 45 % (ref 39.0–52.0)
Hemoglobin: 15.3 g/dL (ref 13.0–17.0)
Hemoglobin: 15.3 g/dL (ref 13.0–17.0)
O2 Saturation: 76 %
O2 Saturation: 77 %
Potassium: 4.2 mmol/L (ref 3.5–5.1)
Potassium: 4.2 mmol/L (ref 3.5–5.1)
Sodium: 138 mmol/L (ref 135–145)
Sodium: 138 mmol/L (ref 135–145)
TCO2: 28 mmol/L (ref 22–32)
TCO2: 28 mmol/L (ref 22–32)
pCO2, Ven: 44.5 mmHg (ref 44.0–60.0)
pCO2, Ven: 44.7 mmHg (ref 44.0–60.0)
pH, Ven: 7.387 (ref 7.250–7.430)
pH, Ven: 7.391 (ref 7.250–7.430)
pO2, Ven: 42 mmHg (ref 32.0–45.0)
pO2, Ven: 42 mmHg (ref 32.0–45.0)

## 2021-03-16 LAB — POCT I-STAT 7, (LYTES, BLD GAS, ICA,H+H)
Acid-Base Excess: 1 mmol/L (ref 0.0–2.0)
Bicarbonate: 25.8 mmol/L (ref 20.0–28.0)
Calcium, Ion: 1.25 mmol/L (ref 1.15–1.40)
HCT: 46 % (ref 39.0–52.0)
Hemoglobin: 15.6 g/dL (ref 13.0–17.0)
O2 Saturation: 98 %
Potassium: 4.2 mmol/L (ref 3.5–5.1)
Sodium: 137 mmol/L (ref 135–145)
TCO2: 27 mmol/L (ref 22–32)
pCO2 arterial: 40.7 mmHg (ref 32.0–48.0)
pH, Arterial: 7.41 (ref 7.350–7.450)
pO2, Arterial: 106 mmHg (ref 83.0–108.0)

## 2021-03-16 SURGERY — RIGHT/LEFT HEART CATH AND CORONARY ANGIOGRAPHY
Anesthesia: LOCAL

## 2021-03-16 MED ORDER — MIDAZOLAM HCL 2 MG/2ML IJ SOLN
INTRAMUSCULAR | Status: DC | PRN
  Administered 2021-03-16: 2 mg via INTRAVENOUS

## 2021-03-16 MED ORDER — HEPARIN SODIUM (PORCINE) 1000 UNIT/ML IJ SOLN
INTRAMUSCULAR | Status: DC | PRN
  Administered 2021-03-16: 4000 [IU] via INTRAVENOUS

## 2021-03-16 MED ORDER — FENTANYL CITRATE (PF) 100 MCG/2ML IJ SOLN
INTRAMUSCULAR | Status: AC
  Filled 2021-03-16: qty 2

## 2021-03-16 MED ORDER — HEPARIN (PORCINE) IN NACL 1000-0.9 UT/500ML-% IV SOLN
INTRAVENOUS | Status: DC | PRN
  Administered 2021-03-16 (×2): 500 mL

## 2021-03-16 MED ORDER — SODIUM CHLORIDE 0.9 % IV SOLN: 250.0000 mL | INTRAVENOUS | Status: DC | PRN

## 2021-03-16 MED ORDER — FUROSEMIDE 20 MG PO TABS: 20.0000 mg | ORAL_TABLET | ORAL | 11 refills | Status: DC

## 2021-03-16 MED ORDER — SODIUM CHLORIDE 0.9 % WEIGHT BASED INFUSION: 1.0000 mL/kg/h | INTRAVENOUS | Status: DC

## 2021-03-16 MED ORDER — ASPIRIN 81 MG PO CHEW: 81.0000 mg | CHEWABLE_TABLET | ORAL | Status: DC

## 2021-03-16 MED ORDER — SODIUM CHLORIDE 0.9% FLUSH: 3.0000 mL | INTRAVENOUS | Status: DC | PRN

## 2021-03-16 MED ORDER — SODIUM CHLORIDE 0.9 % IV SOLN: INTRAVENOUS | Status: DC

## 2021-03-16 MED ORDER — VERAPAMIL HCL 2.5 MG/ML IV SOLN
INTRAVENOUS | Status: DC | PRN
  Administered 2021-03-16: 10:00:00 10 mL via INTRA_ARTERIAL

## 2021-03-16 MED ORDER — VERAPAMIL HCL 2.5 MG/ML IV SOLN
INTRAVENOUS | Status: AC
  Filled 2021-03-16: qty 2

## 2021-03-16 MED ORDER — ONDANSETRON HCL 4 MG/2ML IJ SOLN: 4.0000 mg | Freq: Four times a day (QID) | INTRAMUSCULAR | Status: DC | PRN

## 2021-03-16 MED ORDER — IOHEXOL 350 MG/ML SOLN
INTRAVENOUS | Status: DC | PRN
  Administered 2021-03-16: 11:00:00 50 mL via INTRA_ARTERIAL

## 2021-03-16 MED ORDER — MIDAZOLAM HCL 2 MG/2ML IJ SOLN
INTRAMUSCULAR | Status: AC
  Filled 2021-03-16: qty 2

## 2021-03-16 MED ORDER — SODIUM CHLORIDE 0.9% FLUSH: 3.0000 mL | Freq: Two times a day (BID) | INTRAVENOUS | Status: DC

## 2021-03-16 MED ORDER — HEPARIN SODIUM (PORCINE) 1000 UNIT/ML IJ SOLN
INTRAMUSCULAR | Status: AC
  Filled 2021-03-16: qty 10

## 2021-03-16 MED ORDER — ACETAMINOPHEN 325 MG PO TABS: 650.0000 mg | ORAL_TABLET | ORAL | Status: DC | PRN

## 2021-03-16 MED ORDER — FENTANYL CITRATE (PF) 100 MCG/2ML IJ SOLN
INTRAMUSCULAR | Status: DC | PRN
  Administered 2021-03-16: 25 ug via INTRAVENOUS

## 2021-03-16 MED ORDER — HEPARIN (PORCINE) IN NACL 1000-0.9 UT/500ML-% IV SOLN
INTRAVENOUS | Status: AC
  Filled 2021-03-16: qty 1000

## 2021-03-16 MED ORDER — LIDOCAINE HCL (PF) 1 % IJ SOLN
INTRAMUSCULAR | Status: DC | PRN
  Administered 2021-03-16: 4 mL via INTRADERMAL

## 2021-03-16 MED ORDER — LIDOCAINE HCL (PF) 1 % IJ SOLN
INTRAMUSCULAR | Status: AC
  Filled 2021-03-16: qty 30

## 2021-03-16 SURGICAL SUPPLY — 10 items
CATH BALLN WEDGE 5F 110CM (CATHETERS) ×2 IMPLANT
CATH INFINITI 5FR MULTPACK ANG (CATHETERS) ×2 IMPLANT
GLIDESHEATH SLEND SS 6F .021 (SHEATH) ×2 IMPLANT
GUIDEWIRE INQWIRE 1.5J.035X260 (WIRE) IMPLANT
INQWIRE 1.5J .035X260CM (WIRE) ×3
KIT HEART LEFT (KITS) ×4 IMPLANT
PACK CARDIAC CATHETERIZATION (CUSTOM PROCEDURE TRAY) ×4 IMPLANT
SHEATH GLIDE SLENDER 4/5FR (SHEATH) ×2 IMPLANT
TRANSDUCER W/STOPCOCK (MISCELLANEOUS) ×4 IMPLANT
TUBING CIL FLEX 10 FLL-RA (TUBING) ×4 IMPLANT

## 2021-03-16 NOTE — Interval H&P Note (Signed)
History and Physical Interval Note:  03/16/2021 9:43 AM  Timothy Walker  has presented today for surgery, with the diagnosis of cardiomyopathy.  The various methods of treatment have been discussed with the patient and family. After consideration of risks, benefits and other options for treatment, the patient has consented to  Procedure(s): RIGHT/LEFT HEART CATH AND CORONARY ANGIOGRAPHY (N/A) as a surgical intervention.  The patient's history has been reviewed, patient examined, no change in status, stable for surgery.  I have reviewed the patient's chart and labs.  Questions were answered to the patient's satisfaction.     Collier Salina [facility] 03/16/2021 9:43 AM Cath Lab Visit (complete for each Cath Lab visit)  Clinical Evaluation Leading to the Procedure:   ACS: No.  Non-ACS:    Anginal Classification: CCS I  Anti-ischemic medical therapy: Maximal Therapy (2 or more classes of medications)  Non-Invasive Test Results: No non-invasive testing performed  Prior CABG: No previous CABG

## 2021-03-16 NOTE — Discharge Instructions (Addendum)
Reduce lasix to 20 mg every other day

## 2021-03-24 ENCOUNTER — Ambulatory Visit: Payer: BC Managed Care – PPO | Admitting: Cardiovascular Disease

## 2021-03-25 DIAGNOSIS — Z0279 Encounter for issue of other medical certificate: Secondary | ICD-10-CM

## 2021-03-29 ENCOUNTER — Telehealth: Payer: Self-pay | Admitting: Cardiology

## 2021-03-29 NOTE — Telephone Encounter (Signed)
Forms from [employer] received on 03/25/2021. Completed patient autorization attached. Took form to MD box for completion. 03/29/2021 Delia

## 2021-04-01 NOTE — Telephone Encounter (Signed)
Form completed and faxed to [employer] on 03/31/2021  [facility] 04/01/2021

## 2021-04-12 ENCOUNTER — Ambulatory Visit: Payer: BC Managed Care – PPO | Admitting: Internal Medicine

## 2021-04-12 ENCOUNTER — Other Ambulatory Visit: Payer: Self-pay

## 2021-04-12 ENCOUNTER — Telehealth: Payer: Self-pay | Admitting: Internal Medicine

## 2021-04-12 ENCOUNTER — Encounter: Payer: Self-pay | Admitting: Internal Medicine

## 2021-04-12 VITALS — BP 135/88 | HR 93 | Temp 97.6°F | Ht 71.0 in | Wt 188.2 lb

## 2021-04-12 DIAGNOSIS — R1319 Other dysphagia: Secondary | ICD-10-CM | POA: Diagnosis not present

## 2021-04-12 DIAGNOSIS — K921 Melena: Secondary | ICD-10-CM | POA: Diagnosis not present

## 2021-04-12 NOTE — Progress Notes (Signed)
Primary Care Physician:  Patient, [name] Per (Inactive) Primary Gastroenterologist:  Dr. Gala Romney  Pre-Procedure History & Physical: HPI:  Timothy Walker is a 55 y.o. male here for further evaluation of multiple episodes of painless hematochezia in December 2022 and earlier this month.  He spends up to an hour on the commode trying to have a bowel movement.  He takes his phone in the bathroom.  He use this time to catch up on his telephone.  Has 1 bowel movement every day.  Sometimes has a difficult time evacuating.  Noted bright red blood in the commode in the stool on multiple occasions since last month none in a couple of weeks.  He typically drinks 4 shots of hard liquor or more daily.  Since he noted bleeding he stopped drinking entirely. Has not had any abdominal pain.  He does have GERD well-controlled on pantoprazole 40 mg daily.  He does describe esophageal dysphagia to pills and food.  Previously esophageal dilation back in 2016 (empiric-no lesion found).  Also negative colonoscopy for hematochezia back in 2017.  States he has bad teeth. He does smoke marijuana daily.  No NSAIDs.  Currently on Brilinta.  History of myocardial infarction and cardiogenic shock.  History of heart failure episode December 2022.  His exercise tolerance is limited to walking around the house.  Previously worked with [employer]  Past Medical History:  Diagnosis Date   COPD (chronic obstructive pulmonary disease) ([place])    Coronary artery disease    a. 12/2012 Inf STEMI/Cath/PCI: LM nl, LAD nl, D1 50ost, D2 min irregs, D3 small, LCX 80-50m, OM1/2/3 min irregs, RI 20p, RCA 50p/100d (3.5x18 Xience DEs),PDA/PL nl, EF 10% - complicated by VF/CGS/IABP/VDRF   GERD (gastroesophageal reflux disease)    Hiatal hernia    Hypertension    Ischemic cardiomyopathy    a. 12/2012 Echo: EF 45-50%, basal inf, inflat, mid inf HK, mildly reduced RV fxn.   Marijuana abuse    MI (myocardial infarction) ([place]) 2014    Obstructive sleep apnea    Tobacco abuse     Past Surgical History:  Procedure Laterality Date   COLONOSCOPY WITH PROPOFOL N/A 05/14/2014   Dr. Gala Romney: anal canal hemorrhoid, rectosigmoid hyerpplastic polyp, right-sided divetriculosis    CORONARY ANGIOPLASTY  01/13/2013   STENT TO RCA BY DR Burt Knack   CORONARY STENT INTERVENTION N/A 10/07/2019   Procedure: CORONARY STENT INTERVENTION;  Surgeon: Leonie Man, MD;  Location: [place] CV LAB;  Service: Cardiovascular;  Laterality: N/A;   CORONARY/GRAFT ACUTE MI REVASCULARIZATION N/A 10/07/2019   Procedure: Coronary/Graft Acute MI Revascularization;  Surgeon: Leonie Man, MD;  Location: [place] CV LAB;  Service: Cardiovascular;  Laterality: N/A;   DENTAL SURGERY     ESOPHAGEAL DILATION N/A 05/14/2014   Procedure: ESOPHAGEAL DILATION [place];  Surgeon: Daneil Dolin, MD;  Location: [facility];  Service: Endoscopy;  Laterality: N/A;   ESOPHAGOGASTRODUODENOSCOPY (EGD) WITH PROPOFOL N/A 05/14/2014   Dr. Gala Romney: empiric dilation, abnormal esophagus s/p biopsy (normal), small hiatal hernia   LEFT HEART CATH AND CORONARY ANGIOGRAPHY N/A 09/26/2018   Procedure: LEFT HEART CATH AND CORONARY ANGIOGRAPHY;  Surgeon: Lorretta Harp, MD;  Location: [place] [facility];  Service: Cardiovascular;  Laterality: N/A;   LEFT HEART CATH AND CORONARY ANGIOGRAPHY N/A 10/07/2019   Procedure: LEFT HEART CATH AND CORONARY ANGIOGRAPHY;  Surgeon: Leonie Man, MD;  Location: [place] CV [facility];  Service: Cardiovascular;  Laterality: N/A;  LEFT HEART CATHETERIZATION WITH CORONARY ANGIOGRAM N/A 01/13/2013   Procedure: LEFT HEART CATHETERIZATION WITH CORONARY ANGIOGRAM;  Surgeon: Wellington Hampshire, MD;  Location: [place] CATH LAB;  Service: Cardiovascular;  Laterality: N/A;   NASAL SEPTUM SURGERY     PERCUTANEOUS CORONARY STENT INTERVENTION (PCI-S)  01/13/2013   Procedure: PERCUTANEOUS CORONARY STENT INTERVENTION (PCI-S);  Surgeon: Wellington Hampshire, MD;   Location: [facility] CATH LAB;  Service: Cardiovascular;;   POLYPECTOMY  05/14/2014   Procedure: POLYPECTOMY;  Surgeon: Daneil Dolin, MD;  Location: [facility];  Service: Endoscopy;;   RIGHT/LEFT HEART CATH AND CORONARY ANGIOGRAPHY N/A 03/16/2021   Procedure: RIGHT/LEFT HEART CATH AND CORONARY ANGIOGRAPHY;  Surgeon: Martinique, Peter M, MD;  Location: [place] CV LAB;  Service: Cardiovascular;  Laterality: N/A;    Prior to Admission medications   Medication Sig Start Date End Date Taking? Authorizing Provider  albuterol (PROVENTIL) (2.5 MG/3ML) 0.083% nebulizer solution Take 3 mLs (2.5 mg total) by nebulization every 2 (two) hours as needed for wheezing or shortness of breath. 03/10/21   Roxan Hockey, MD  albuterol (VENTOLIN HFA) 108 (90 Base) MCG/ACT inhaler Inhale 2 puffs into the lungs every 4 (four) hours as needed for wheezing or shortness of breath. 03/10/21   Roxan Hockey, MD  amLODipine (NORVASC) 10 MG tablet Take 1 tablet (10 mg total) by mouth daily. 03/11/21   Roxan Hockey, MD  aspirin EC 81 MG EC tablet Take 1 tablet (81 mg total) by mouth daily with breakfast. Swallow whole. 03/11/21   Roxan Hockey, MD  atorvastatin (LIPITOR) 80 MG tablet Take 1 tablet (80 mg total) by mouth daily. 03/11/21   Roxan Hockey, MD  furosemide (LASIX) 20 MG tablet Take 1 tablet (20 mg total) by mouth every other day. 03/16/21 03/16/22  Martinique, Peter M, MD  guaiFENesin (MUCINEX) 600 MG 12 hr tablet Take 1 tablet (600 mg total) by mouth 2 (two) times daily. 03/10/21   Roxan Hockey, MD  losartan (COZAAR) 100 MG tablet Take 1 tablet (100 mg total) by mouth daily. 03/10/21 03/10/22  Roxan Hockey, MD  metoprolol succinate (TOPROL-XL) 100 MG 24 hr tablet Take 1 tablet (100 mg total) by mouth daily. Take with or immediately following a meal. 03/10/21   Emokpae, Courage, MD  nicotine (NICODERM CQ - DOSED IN MG/24 HOURS) 21 mg/24hr patch Place 1 patch (21 mg total) onto the skin daily. Patient taking  differently: Place 21 mg onto the skin daily as needed. 03/11/21   Roxan Hockey, MD  nitroGLYCERIN (NITROSTAT) 0.4 MG SL tablet Place 1 tablet (0.4 mg total) under the tongue every 5 (five) minutes as needed for chest pain. 10/01/18   Arnoldo Lenis, MD  pantoprazole (PROTONIX) 40 MG tablet Take 40 mg by mouth daily.    [provider]  potassium chloride (KLOR-CON) 10 MEQ tablet Take 1 tablet (10 mEq total) by mouth daily. Take While taking Lasix/furosemide 03/10/21   Roxan Hockey, MD  predniSONE (DELTASONE) 20 MG tablet Take 20 mg by mouth 1 day or 1 dose. Broncitis 03/09/21   [provider]  ticagrelor (BRILINTA) 90 MG TABS tablet Take 1 tablet (90 mg total) by mouth 2 (two) times daily. 03/10/21   Roxan Hockey, MD  umeclidinium bromide (INCRUSE ELLIPTA) 62.5 MCG/ACT AEPB Inhale 1 puff into the lungs daily. 03/10/21   Roxan Hockey, MD    Allergies as of 04/12/2021   (No Known Allergies)    Family History  Problem Relation Age of Onset   Heart attack  Other    Hypertension Other    Colon cancer Neg Hx     Social History   Socioeconomic History   Marital status: Divorced    Spouse name: Not on file   Number of children: Not on file   Years of education: Not on file   Highest education level: Not on file  Occupational History   Not on file  Tobacco Use   Smoking status: Every Day    Packs/day: 0.75    Years: 31.00    Pack years: 23.25    Types: Cigarettes    Start date: 11/21/1981   Smokeless tobacco: Never  Vaping Use   Vaping Use: Some days   Substances: Nicotine, CBD  Substance and Sexual Activity   Alcohol use: Yes    Alcohol/week: 2.0 standard drinks    Types: 1 Cans of beer, 1 Shots of liquor per week    Comment: 3-5 shots of liquor daily   Drug use: Yes    Types: Marijuana    Comment: last night   Sexual activity: Yes  Other Topics Concern   Not on file  Social History Narrative   Not on file   Social Determinants of  Health   Financial Resource Strain: Not on file  Food Insecurity: Not on file  Transportation Needs: Not on file  Physical Activity: Not on file  Stress: Not on file  Social Connections: Not on file  Intimate Partner Violence: Not on file    Review of Systems: See HPI, otherwise negative ROS  Physical Exam: There were no vitals taken for this visit. General:   Alert,  Well-developed, well-nourished, pleasant and cooperative in NAD Eyes:  Sclera clear, no icterus.   Conjunctiva pink. Mouth: Poor dentition.  He is missing most of his teeth. Lungs:  Clear throughout to auscultation.   No wheezes, crackles, or rhonchi. No acute distress. Heart:  Regular rate and rhythm; no murmurs, clicks, rubs,  or gallops. Abdomen: Non-distended, normal bowel sounds.  Soft and nontender without appreciable mass or hepatosplenomegaly.  Pulses:  Normal pulses noted. Extremities:  Without clubbing or edema. Rectal: 1 small grade 2 hemorrhoid tag.  Digital exam is nontender.  I do not detect any masses in the rectal vault.  Scant brown stool is Hemoccult negative  Impression/Plan: 56 year old gentleman with painless rectal bleeding - multiple episodes in the past 2 months.  He has mild constipation..  He he dwells on the toilet too long while utilizing his telephone on a regular basis. History of GERD well controlled but recurrent esophageal dysphagia without a encroaching lesion found in his esophagus previously.  Empirically dilated in the past.  Dentition very poor. I suspect alcohol use disorder.  I did frank discussion about his alcohol intake.  It would be of overall great benefit for him to refrain from consuming alcohol in the future.  As far as rectal bleeding as he could easily have an intermittent fissure or hemorrhoid.  However, it has been a good 7 years since he had his lower GI tract evaluated.  Recommendations:   As discussed, it would be best  to stop drinking alcohol entirely; if not, I  agree with his cardiologist-limit alcohol consumption to 2 shots weekly would be the next best thing.  Continue pantoprazole 40 mg once daily for reflux  Begin Benefiber 1 tablespoon daily every day for 3 weeks; then increase to 2 tablespoons daily thereafter  Avoid straining; limit toilet time to 2 to 5 minutes  Chrys Racer  apothecary cream-plain.  Apply a pea-sized amount to the anorectum 3 times a day as needed.  Dispense 30 g.  No refills.  As discussed, we will schedule an EGD with possible esophageal dilation and a diagnostic colonoscopy (esophageal dysphagia and hematochezia) ASA 3/propofol.  The risks, benefits, limitations, imponderables and alternatives regarding both EGD and colonoscopy have been reviewed with the patient. Questions have been answered. All parties agreeable.    I would like you to come off the Brilinta for the procedure.  We will check with your cardiologist to see if he can come off of it for 5 days.  If that is not acceptable, we can do these procedures on Brilinta but there is somewhat of an increased risk of bleeding but they are still safely doable.  Further recommendations to follow.       Notice: This dictation was prepared with Dragon dictation along with smaller phrase technology. Any transcriptional errors that result from this process are unintentional and may not be corrected upon review.

## 2021-04-12 NOTE — Telephone Encounter (Signed)
Error

## 2021-04-12 NOTE — Telephone Encounter (Signed)
Unsure of who patient spoke with, but he wanted to thank you for going through the trouble but the prescription was 55$.  Is there anything else he can try?

## 2021-04-12 NOTE — Patient Instructions (Signed)
It was good to see you again today!  As discussed, it would be best for you to stop drinking alcohol entirely; if not, I agree with your cardiologist-limit alcohol consumption to 2 shots weekly would be the next best thing.  Continue pantoprazole 40 mg once daily for reflux  Begin Benefiber 1 tablespoon daily every day for 3 weeks; then increase to 2 tablespoons daily thereafter  Avoid straining; limit toilet time to 2 to 5 minutes  [employer].  Apply a pea-sized amount to your anorectum 3 times a day as needed.  Dispense 30 g.  No refills.  As discussed, we will schedule an EGD with possible esophageal dilation and a diagnostic colonoscopy (esophageal dysphagia and hematochezia) ASA 3/propofol  I would like you to come off the Brilinta for the procedure.  We will check with your cardiologist to see if he can come off of it for 5 days.  If that is not acceptable, we can do these procedures on Brilinta but there is somewhat of an increased risk of bleeding but they are still safely doable.  Further recommendations to follow.

## 2021-04-12 NOTE — Progress Notes (Signed)
Cardiology Office Note    Date:  04/26/2021   ID:  Timothy Walker, DOB [birth year], MRN [id number]   PCP:  Patient, [name] Per (Inactive)   [place]  Cardiologist:  Carlyle Dolly, MD   Advanced Practice Provider:  No care team member to display Electrophysiologist:  None   [id number]   Chief Complaint  Patient presents with   Hospitalization Follow-up    History of Present Illness:  Timothy Walker is a 56 y.o. male with history of CAD STEMI and VF arrest 2014 lesion in Cfx not intervened on but STEMI 10/07/19 stent mCfx/OM2, ICM EF 30-35% inf RWMA mild MR, normal RV severe LAE.  Admitted HTN urgency and pulmonary edema, EKG new lateral TWI.  Patient saw Dr. Johnsie Cancel in follow-up 03/14/2021 and was not taking his Norvasc.  He recommend repeat cath to make sure his stents are patent and optimize meds and plan an MRI in 2 to 3 months to risk stratify for AICD. Plan entresto and aldactone after cath. Cath 03/16/21 nonobstructive CAD stents in RCA and Cfx patent 99% side branch OM1. Right heart pressures normal so lasix decreased every other day.   Patient comes in for f/u.Complains of heart skipping and uncomfortable feeling. Doesn't take his meds at same time everyday. Out of work, sleeps all day and up all night. Smoking 5-8 cigarettes daily. Was drinking daily bourbon-3-4 shots. Both these things aggravate palpitations. Tried walking yest and got very tired after second lap. Thinking about applying for disability. Doesn't think he can do his job. Under a lot of stress, dealing with depression. Currently on short term disability.  Past Medical History:  Diagnosis Date   COPD (chronic obstructive pulmonary disease) ([place])    Coronary artery disease    a. 12/2012 Inf STEMI/Cath/PCI: LM nl, LAD nl, D1 50ost, D2 min irregs, D3 small, LCX 80-70m, OM1/2/3 min irregs, RI 20p, RCA 50p/100d (3.5x18 Xience DEs),PDA/PL nl, EF 74% - complicated by VF/CGS/IABP/VDRF   GERD  (gastroesophageal reflux disease)    Hiatal hernia    Hypertension    Ischemic cardiomyopathy    a. 12/2012 Echo: EF 45-50%, basal inf, inflat, mid inf HK, mildly reduced RV fxn.   Marijuana abuse    MI (myocardial infarction) ([place]) 2014   Obstructive sleep apnea    Tobacco abuse     Past Surgical History:  Procedure Laterality Date   COLONOSCOPY WITH PROPOFOL N/A 05/14/2014   Dr. Gala Romney: anal canal hemorrhoid, rectosigmoid hyerpplastic polyp, right-sided divetriculosis    CORONARY ANGIOPLASTY  01/13/2013   STENT TO RCA BY DR Burt Knack   CORONARY STENT INTERVENTION N/A 10/07/2019   Procedure: CORONARY STENT INTERVENTION;  Surgeon: Leonie Man, MD;  Location: [place] [facility];  Service: Cardiovascular;  Laterality: N/A;   CORONARY/GRAFT ACUTE MI REVASCULARIZATION N/A 10/07/2019   Procedure: Coronary/Graft Acute MI Revascularization;  Surgeon: Leonie Man, MD;  Location: [place] [facility];  Service: Cardiovascular;  Laterality: N/A;   DENTAL SURGERY     ESOPHAGEAL DILATION N/A 05/14/2014   Procedure: ESOPHAGEAL DILATION [place];  Surgeon: Daneil Dolin, MD;  Location: [facility];  Service: Endoscopy;  Laterality: N/A;   ESOPHAGOGASTRODUODENOSCOPY (EGD) WITH PROPOFOL N/A 05/14/2014   Dr. Gala Romney: empiric dilation, abnormal esophagus s/p biopsy (normal), small hiatal hernia   LEFT HEART CATH AND CORONARY ANGIOGRAPHY N/A 09/26/2018   Procedure: LEFT HEART CATH AND CORONARY ANGIOGRAPHY;  Surgeon: Lorretta Harp, MD;  Location: [place] [facility];  Service: Cardiovascular;  Laterality: N/A;   LEFT HEART CATH AND CORONARY ANGIOGRAPHY N/A 10/07/2019   Procedure: LEFT HEART CATH AND CORONARY ANGIOGRAPHY;  Surgeon: Leonie Man, MD;  Location: [facility];  Service: Cardiovascular;  Laterality: N/A;   LEFT HEART CATHETERIZATION WITH CORONARY ANGIOGRAM N/A 01/13/2013   Procedure: LEFT HEART CATHETERIZATION WITH CORONARY ANGIOGRAM;  Surgeon: Wellington Hampshire, MD;  Location: [facility];  Service: Cardiovascular;  Laterality: N/A;   NASAL SEPTUM SURGERY     PERCUTANEOUS CORONARY STENT INTERVENTION (PCI-S)  01/13/2013   Procedure: PERCUTANEOUS CORONARY STENT INTERVENTION (PCI-S);  Surgeon: Wellington Hampshire, MD;  [facility];  Service: Cardiovascular;;   POLYPECTOMY  05/14/2014   Procedure: POLYPECTOMY;  Surgeon: Daneil Dolin, MD;  Location: [facility];  Service: Endoscopy;;   RIGHT/LEFT HEART CATH AND CORONARY ANGIOGRAPHY N/A 03/16/2021   Procedure: RIGHT/LEFT HEART CATH AND CORONARY ANGIOGRAPHY;  Surgeon: Martinique, Peter M, MD;  Location: [place] [facility];  Service: Cardiovascular;  Laterality: N/A;    Current Medications: Current Meds  Medication Sig   albuterol (PROVENTIL) (2.5 MG/3ML) 0.083% nebulizer solution Take 3 mLs (2.5 mg total) by nebulization every 2 (two) hours as needed for wheezing or shortness of breath.   albuterol (VENTOLIN HFA) 108 (90 Base) MCG/ACT inhaler Inhale 2 puffs into the lungs every 4 (four) hours as needed for wheezing or shortness of breath.   amLODipine (NORVASC) 10 MG tablet Take 1 tablet (10 mg total) by mouth daily.   aspirin EC 81 MG EC tablet Take 1 tablet (81 mg total) by mouth daily with breakfast. Swallow whole.   atorvastatin (LIPITOR) 80 MG tablet Take 1 tablet (80 mg total) by mouth daily.   furosemide (LASIX) 20 MG tablet Take 1 tablet (20 mg total) by mouth every other day.   losartan (COZAAR) 100 MG tablet Take 1 tablet (100 mg total) by mouth daily.   metoprolol succinate (TOPROL-XL) 100 MG 24 hr tablet Take 1 tablet (100 mg total) by mouth daily. Take with or immediately following a meal.   nicotine (NICODERM CQ - DOSED IN MG/24 HOURS) 21 mg/24hr patch Place 1 patch (21 mg total) onto the skin daily. (Patient taking differently: Place 21 mg onto the skin daily as needed.)   nitroGLYCERIN (NITROSTAT) 0.4 MG SL tablet Place 1 tablet (0.4 mg total) under the tongue every 5 (five) minutes as needed for chest pain.    pantoprazole (PROTONIX) 40 MG tablet Take 40 mg by mouth daily.   potassium chloride (KLOR-CON) 10 MEQ tablet Take 1 tablet (10 mEq total) by mouth daily. Take While taking Lasix/furosemide (Patient taking differently: Take 10 mEq by mouth every other day. Take While taking Lasix/furosemide)   sacubitril-valsartan (ENTRESTO) 49-51 MG Take 1 tablet by mouth 2 (two) times daily. Please Honor Card patient is presenting for [name]: [id number]; [name]: [id number]; OBSJG: OHS; GEZM: [phone]   ticagrelor (BRILINTA) 90 MG TABS tablet Take 1 tablet (90 mg total) by mouth 2 (two) times daily. (Patient taking differently: Take 90 mg by mouth daily at 6 (six) AM.)     Allergies:   Patient has no known allergies.   Social History   Socioeconomic History   Marital status: Divorced    Spouse name: Not on file   Number of children: Not on file   Years of education: Not on file   Highest education level: Not on file  Occupational History   Not on file  Tobacco  Use   Smoking status: Every Day    Packs/day: 0.75    Years: 31.00    Pack years: 23.25    Types: Cigarettes    Start date: 11/21/1981   Smokeless tobacco: Never  Vaping Use   Vaping Use: Some days   Substances: Nicotine, CBD  Substance and Sexual Activity   Alcohol use: Not Currently    Comment: 2-4 shots of burbon daily, if off work may be a little more   Drug use: Yes    Types: Marijuana    Comment: daily   Sexual activity: Yes  Other Topics Concern   Not on file  Social History Narrative   Not on file   Social Determinants of Health   Financial Resource Strain: Not on file  Food Insecurity: Not on file  Transportation Needs: Not on file  Physical Activity: Not on file  Stress: Not on file  Social Connections: Not on file     Family History:  The patient's  family history includes Heart attack in an other family member; Hypertension in an other family member.   ROS:   Please see the history of present illness.     ROS All other systems reviewed and are negative.   PHYSICAL EXAM:   VS:  BP 137/89    Pulse (!) 50    Ht 5\' 11"  (1.803 m)    Wt 191 lb (86.6 kg)    BMI 26.64 kg/m   Physical Exam  GEN: Well nourished, well developed, in no acute distress  Neck: no JVD, carotid bruits, or masses Cardiac:RRR; no murmurs, rubs, or gallops  Respiratory:  clear to auscultation bilaterally, normal work of breathing GI: soft, nontender, nondistended, + BS Ext: without cyanosis, clubbing, or edema, Good distal pulses bilaterally Neuro:  Alert and Oriented x 3 Psych: euthymic mood, full affect  Wt Readings from Last 3 Encounters:  04/26/21 191 lb (86.6 kg)  04/12/21 188 lb 3.2 oz (85.4 kg)  03/16/21 188 lb (85.3 kg)      Studies/Labs Reviewed:   EKG:  EKG is not ordered today.     Recent Labs: 03/09/2021: ALT 18; B Natriuretic Peptide 898.0; Magnesium 1.8 03/14/2021: BUN 16; Creatinine, Ser 0.89; Platelets 282 03/16/2021: Hemoglobin 15.3; Hemoglobin 15.3; Potassium 4.2; Potassium 4.2; Sodium 138; Sodium 138   Lipid Panel    Component Value Date/Time   CHOL 146 10/09/2019 0805   TRIG 165 (H) 10/09/2019 0805   HDL 39 (L) 10/09/2019 0805   CHOLHDL 3.7 10/09/2019 0805   VLDL 33 10/09/2019 0805   LDLCALC 74 10/09/2019 0805    Additional studies/ records that were reviewed today include:  Cath 03/16/21   Prox RCA lesion is 30% stenosed.   [place] lesion is 30% stenosed.   LPAV lesion is 80% stenosed.   Non-stenotic Prox Cx to Dist Cx lesion with 99% stenosed side branch in 1st Mrg was previously treated.   Non-stenotic Mid RCA to Dist RCA lesion was previously treated.   LV end diastolic pressure is normal.   Nonobstructive CAD. Prior stents in the RCA and LCx are patent.  Low LV filling pressures. PCWP 4 mm Hg, LVEDP 3 mm Hg Normal right heart pressures. PAP mean 11 mm Hg Normal cardiac output with index 2.76    Plan: will reduce lasix to every other day until seen back in office. Continue  medical management.    Echo 03/09/21 IMPRESSIONS     1. Left ventricular ejection fraction, by estimation, is 30  to 35%. The  left ventricle has moderately decreased function. The left ventricle  demonstrates regional wall motion abnormalities (see scoring  diagram/findings for description). The basal-to-mid   inferior wall is akinetic. The rest of the LV segments are hypokinetic.  The left ventricular internal cavity size was moderately dilated. There is  mild concentric left ventricular hypertrophy. Left ventricular diastolic  parameters are consistent with  Grade II diastolic dysfunction (pseudonormalization).   2. Right ventricular systolic function is normal. The right ventricular  size is normal. Tricuspid regurgitation signal is inadequate for assessing  PA pressure.   3. Left atrial size was severely dilated.   4. The mitral valve is normal in structure. Mild mitral valve  regurgitation.   5. The aortic valve is tricuspid. There is mild calcification of the  aortic valve. There is mild thickening of the aortic valve. Aortic valve  regurgitation is not visualized. Aortic valve sclerosis/calcification is  present, without any evidence of  aortic stenosis.   6. Aortic dilatation noted. There is mild dilatation of the aortic root,  measuring 38 mm.   7. The inferior vena cava is normal in size with greater than 50%  respiratory variability, suggesting right atrial pressure of 3 mmHg.   Comparison(s): Compared to prior TTE in 2020, the LVEF has dropped from  about 45% to 35%.   Risk Assessment/Calculations:         ASSESSMENT:    1. Coronary artery disease involving native coronary artery of native heart without angina pectoris   2. Ischemic cardiomyopathy   3. Essential hypertension   4. Hyperlipidemia, unspecified hyperlipidemia type   5. Tobacco abuse   6. Palpitations      PLAN:  In order of problems listed above:  CAD status post STEMI and V. fib arrest  2014 lesion circumflex not intervened on but STEMI 10/07/2019 stent to the mid circumflex and OM 2, most recent cath 03/16/2021 nonobstructive CAD with stent to the RCA and circumflex patent 99% sidebranch OM1.  Treat medically-no angina but weak and fatigued. Increase exercise gradually.   Ischemic cardiomyopathy ejection fraction 30 to 35% .will hold losartan 36 hrs and start entresto 49/51 mg bid. Bmet in 2 weeks.  Per Dr. Johnsie Cancel consider MRI to risk stratify for AICD. Will have f/u with Dr. Harl Bowie  Palpitations worse with smoking and alcohol. Cessation of both discussed. Place 2 week zio.  Hypertensive urgency with pulmonary edema 02/2021  Hypertension controlled  HLD on lipitor  Tobacco abuse smoking cessation discussed.  Shared Decision Making/Informed Consent        Medication Adjustments/Labs and Tests Ordered: Current medicines are reviewed at length with the patient today.  Concerns regarding medicines are outlined above.  Medication changes, Labs and Tests ordered today are listed in the Patient Instructions below. Patient Instructions  Medication Instructions:    START TAKING ENTRESTO 49-51 TWICE A DAY   MAKE SURE TO HOLD LOSARTAN  36 HOURS  BEFORE STARTING ENTRESTO    *If you need a refill on your cardiac medications before your next appointment, please call your pharmacy*   Lab Work:  [place] [facility]    If you have labs (blood work) drawn today and your tests are completely normal, you will receive your results only by: [place] (if you have MyChart) OR A paper copy in the mail If you have any lab test that is abnormal or we need to change your treatment, we will call you to review the results.  Testing/Procedures: Your physician has recommended that you wear an event monitor. Event monitors are medical devices that record the hearts electrical activity. Doctors most often [place] these monitors to diagnose arrhythmias. Arrhythmias are problems  with the speed or rhythm of the heartbeat. The monitor is a small, portable device. You can wear one while you do your normal daily activities. This is usually used to diagnose what is causing palpitations/syncope (passing out).     Follow-Up: At [facility], you and your health needs are our priority.  As part of our continuing mission to provide you with exceptional heart care, we have created designated Provider Care Teams.  These Care Teams include your primary Cardiologist (physician) and Advanced Practice Providers (APPs -  Physician Assistants and Nurse Practitioners) who all work together to provide you with the care you need, when you need it.  We recommend signing up for the patient portal called "MyChart".  Sign up information is provided on this After Visit Summary.  MyChart is used to connect with patients for Virtual Visits (Telemedicine).  Patients are able to view lab/test results, encounter notes, upcoming appointments, etc.  Non-urgent messages can be sent to your provider as well.   To learn more about what you can do with MyChart, go to [email or web address].    Your next appointment:  NEXT AVAILABLE   The format for your next appointment:   In Person  Provider:   Carlyle Dolly, MD    Other Instructions    Signed, Ermalinda Barrios, PA-C  04/26/2021 2:35 PM    [facility] [place], [place], [place]  [address] Phone: [phone]; Fax: [phone]

## 2021-04-13 ENCOUNTER — Telehealth: Payer: Self-pay

## 2021-04-13 NOTE — Telephone Encounter (Signed)
Phoned and LMOVM for the pt to return call 

## 2021-04-13 NOTE — Telephone Encounter (Signed)
Attention: Preop   We would like to request holding the following medication for patient please.  Procedure: EGD with possible esophageal dilation and colonoscopy  Date: TBD  Medication to hold: Brilinta x 5 days  Surgeon: Dr. Gala Romney  Phone: [phone]  Fax:  [phone]  Type of Anesthesia: Propofol

## 2021-04-14 NOTE — Telephone Encounter (Signed)
Ok to hold Timothy Meals MD

## 2021-04-14 NOTE — Telephone Encounter (Signed)
Patient had recent cardiac catheterization that showed a stable anatomy with patent RCA and the left circumflex stent.  No PCI was performed.  Last PCI was on 10/07/2019.    I left the patient a message for him to call back and speak to the on-call preop APP of the day.    Dr. Harl Bowie to review, as long as the patient does not have any new issues, would you be okay with him holding Brilinta for 5 days prior to GI procedure.

## 2021-04-14 NOTE — Telephone Encounter (Signed)
Ok to [employer] as requested per Dr. Harl Bowie. See correspondence below.

## 2021-04-14 NOTE — Telephone Encounter (Signed)
FYI:  Phoned and advised the pt of the note and instructions of the cream. Pt expressed understanding

## 2021-04-14 NOTE — Telephone Encounter (Signed)
Called pt, informed him ok was received from cardiology for him to hold Brilinta for 5 days before procedure. He is aware Dr. Gala Romney will not be available in February. Offered for him to have procedure w/Dr. Abbey Chatters in February but he would like to wait until Dr. Gala Romney returns. Will call pt to schedule procedure when Dr. Roseanne Kaufman future schedule is available.

## 2021-04-15 NOTE — Telephone Encounter (Signed)
° °  Primary Cardiologist: Carlyle Dolly, MD  Chart reviewed as part of pre-operative protocol coverage. Given past medical history and time since last visit, based on ACC/AHA guidelines, Timothy Walker would be at acceptable risk for the planned procedure without further cardiovascular testing.   His Brilinta may be held for 5 days prior to his procedure.  Please resume as soon as hemostasis is achieved.  I will route this recommendation to the requesting party via Epic fax function and remove from pre-op pool.  Please call with questions.  Jossie Ng. Avraj Lindroth NP-C    04/15/2021, 3:16 PM [place] Office [phone] Fax [phone]

## 2021-04-26 ENCOUNTER — Other Ambulatory Visit: Payer: Self-pay

## 2021-04-26 ENCOUNTER — Ambulatory Visit (INDEPENDENT_AMBULATORY_CARE_PROVIDER_SITE_OTHER): Payer: BC Managed Care – PPO

## 2021-04-26 ENCOUNTER — Encounter: Payer: Self-pay | Admitting: Physician Assistant

## 2021-04-26 ENCOUNTER — Ambulatory Visit: Payer: BC Managed Care – PPO | Admitting: Physician Assistant

## 2021-04-26 ENCOUNTER — Encounter: Payer: Self-pay | Admitting: *Deleted

## 2021-04-26 VITALS — BP 137/89 | HR 50 | Ht 71.0 in | Wt 191.0 lb

## 2021-04-26 DIAGNOSIS — I2510 Atherosclerotic heart disease of native coronary artery without angina pectoris: Secondary | ICD-10-CM

## 2021-04-26 DIAGNOSIS — R002 Palpitations: Secondary | ICD-10-CM | POA: Diagnosis not present

## 2021-04-26 DIAGNOSIS — Z720 Tobacco use: Secondary | ICD-10-CM

## 2021-04-26 DIAGNOSIS — I255 Ischemic cardiomyopathy: Secondary | ICD-10-CM

## 2021-04-26 DIAGNOSIS — E785 Hyperlipidemia, unspecified: Secondary | ICD-10-CM | POA: Diagnosis not present

## 2021-04-26 DIAGNOSIS — I10 Essential (primary) hypertension: Secondary | ICD-10-CM | POA: Diagnosis not present

## 2021-04-26 MED ORDER — ENTRESTO 49-51 MG PO TABS: 1.0000 | ORAL_TABLET | Freq: Two times a day (BID) | ORAL | 1 refills | Status: DC

## 2021-04-26 NOTE — Patient Instructions (Addendum)
Medication Instructions:    START TAKING ENTRESTO 49-51 TWICE A DAY   MAKE SURE TO HOLD LOSARTAN  36 HOURS  BEFORE STARTING ENTRESTO    *If you need a refill on your cardiac medications before your next appointment, please call your pharmacy*   Lab Work:  [place] [facility]    If you have labs (blood work) drawn today and your tests are completely normal, you will receive your results only by: [place] (if you have MyChart) OR A paper copy in the mail If you have any lab test that is abnormal or we need to change your treatment, we will call you to review the results.   Testing/Procedures: Your physician has recommended that you wear an event monitor. Event monitors are medical devices that record the hearts electrical activity. Doctors most often [place] these monitors to diagnose arrhythmias. Arrhythmias are problems with the speed or rhythm of the heartbeat. The monitor is a small, portable device. You can wear one while you do your normal daily activities. This is usually used to diagnose what is causing palpitations/syncope (passing out).     Follow-Up: At [facility], you and your health needs are our priority.  As part of our continuing mission to provide you with exceptional heart care, we have created designated Provider Care Teams.  These Care Teams include your primary Cardiologist (physician) and Advanced Practice Providers (APPs -  Physician Assistants and Nurse Practitioners) who all work together to provide you with the care you need, when you need it.  We recommend signing up for the patient portal called "MyChart".  Sign up information is provided on this After Visit Summary.  MyChart is used to connect with patients for Virtual Visits (Telemedicine).  Patients are able to view lab/test results, encounter notes, upcoming appointments, etc.  Non-urgent messages can be sent to your provider as well.   To learn more about what you can do with MyChart, go to  [email or web address].    Your next appointment:  NEXT AVAILABLE   The format for your next appointment:   In Person  Provider:   Carlyle Dolly, MD    Other Instructions

## 2021-05-24 NOTE — Telephone Encounter (Signed)
LMOVM to for pt call back

## 2021-05-25 NOTE — Telephone Encounter (Signed)
LMOVM to call back. Letter mailed. 

## 2021-06-07 ENCOUNTER — Telehealth: Payer: Self-pay | Admitting: *Deleted

## 2021-06-07 MED ORDER — METOPROLOL SUCCINATE ER 100 MG PO TB24: 150.0000 mg | ORAL_TABLET | Freq: Every day | ORAL | 3 refills | Status: DC

## 2021-06-07 NOTE — Telephone Encounter (Signed)
Pt notified and order placed 

## 2021-06-07 NOTE — Telephone Encounter (Signed)
-----   Message from Imogene Burn, PA-C sent at 06/06/2021  9:02 AM EDT ----- ?Monitor shows frequent PVC's and runs of NSVT. Please increase toprol xl 150 mg daily-can take 100 mg in am and 1/2 tablet(50 mg) in evening if he prefers. Imperative to decrease smoking and alcohol which are triggers. Keep f/u with Dr. Harl Bowie. thanks ?
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
## 2021-06-24 ENCOUNTER — Encounter (HOSPITAL_COMMUNITY): Payer: Self-pay | Admitting: Radiology

## 2021-07-19 ENCOUNTER — Other Ambulatory Visit: Payer: Self-pay | Admitting: Internal Medicine

## 2021-07-19 ENCOUNTER — Ambulatory Visit: Payer: BC Managed Care – PPO | Admitting: Internal Medicine

## 2021-07-19 ENCOUNTER — Encounter: Payer: Self-pay | Admitting: Cardiology

## 2021-07-19 ENCOUNTER — Telehealth: Payer: Self-pay | Admitting: Internal Medicine

## 2021-07-19 ENCOUNTER — Ambulatory Visit: Payer: BC Managed Care – PPO | Admitting: Cardiology

## 2021-07-19 ENCOUNTER — Encounter: Payer: Self-pay | Admitting: Internal Medicine

## 2021-07-19 VITALS — BP 142/108 | HR 89 | Resp 18 | Ht 71.0 in | Wt 186.8 lb

## 2021-07-19 VITALS — BP 132/100 | HR 96 | Ht 71.0 in | Wt 187.0 lb

## 2021-07-19 DIAGNOSIS — I10 Essential (primary) hypertension: Secondary | ICD-10-CM | POA: Diagnosis not present

## 2021-07-19 DIAGNOSIS — I493 Ventricular premature depolarization: Secondary | ICD-10-CM

## 2021-07-19 DIAGNOSIS — I2510 Atherosclerotic heart disease of native coronary artery without angina pectoris: Secondary | ICD-10-CM

## 2021-07-19 DIAGNOSIS — Z720 Tobacco use: Secondary | ICD-10-CM

## 2021-07-19 DIAGNOSIS — I5022 Chronic systolic (congestive) heart failure: Secondary | ICD-10-CM

## 2021-07-19 DIAGNOSIS — J449 Chronic obstructive pulmonary disease, unspecified: Secondary | ICD-10-CM

## 2021-07-19 DIAGNOSIS — I25110 Atherosclerotic heart disease of native coronary artery with unstable angina pectoris: Secondary | ICD-10-CM | POA: Diagnosis not present

## 2021-07-19 DIAGNOSIS — I5042 Chronic combined systolic (congestive) and diastolic (congestive) heart failure: Secondary | ICD-10-CM

## 2021-07-19 DIAGNOSIS — F331 Major depressive disorder, recurrent, moderate: Secondary | ICD-10-CM | POA: Insufficient documentation

## 2021-07-19 DIAGNOSIS — K219 Gastro-esophageal reflux disease without esophagitis: Secondary | ICD-10-CM

## 2021-07-19 DIAGNOSIS — H6123 Impacted cerumen, bilateral: Secondary | ICD-10-CM

## 2021-07-19 DIAGNOSIS — M5416 Radiculopathy, lumbar region: Secondary | ICD-10-CM | POA: Insufficient documentation

## 2021-07-19 DIAGNOSIS — N529 Male erectile dysfunction, unspecified: Secondary | ICD-10-CM

## 2021-07-19 MED ORDER — NITROGLYCERIN 0.4 MG SL SUBL: 0.4000 mg | SUBLINGUAL_TABLET | SUBLINGUAL | 3 refills | Status: DC | PRN

## 2021-07-19 MED ORDER — SILDENAFIL CITRATE 50 MG PO TABS: 50.0000 mg | ORAL_TABLET | Freq: Every day | ORAL | 2 refills | Status: AC | PRN

## 2021-07-19 MED ORDER — TRELEGY ELLIPTA 100-62.5-25 MCG/ACT IN AEPB: 1.0000 | INHALATION_SPRAY | Freq: Every day | RESPIRATORY_TRACT | 11 refills | Status: DC

## 2021-07-19 MED ORDER — ENTRESTO 49-51 MG PO TABS: 0.5000 | ORAL_TABLET | Freq: Two times a day (BID) | ORAL | Status: DC

## 2021-07-19 MED ORDER — ALBUTEROL SULFATE HFA 108 (90 BASE) MCG/ACT IN AERS: 2.0000 | INHALATION_SPRAY | RESPIRATORY_TRACT | 5 refills | Status: DC | PRN

## 2021-07-19 NOTE — Assessment & Plan Note (Signed)
Sildenafil as needed, advised to avoid taking it with nitroglycerin ?

## 2021-07-19 NOTE — Assessment & Plan Note (Addendum)
History of ischemic cardiomyopathy ?Takes Lasix every other day ?On losartan and metoprolol ?Followed by cardiology ?

## 2021-07-19 NOTE — Progress Notes (Signed)
? ? ? ?Clinical Summary ?Timothy Walker is a 56 y.o.male seen today for follow up of the following medical problems.  ?  ?1. CAD   ?- admit to [facility] 01/13/13 to 01/17/13 with STEMI, DES to RCA.   ?- patient presented in cardiogenic shock, suffered VT/VF arrest requiring multiple defibrillations and IV amiodarone. Following PCI required balloon pump and vasopressors and temporary transvenous pacing.   ?- Echo LVEF 45-50%, inferior hypokinesis, normal diastolic function ?   ?  ?  ?09/2018 cath mid LAD 30%, ramus 30%, LCX mid 90% small nondom and stable, RCA 30% prox. ?09/2018 echo LVEF 40-98%, grade I diastolic dysfunction, basal mid inferior akinesis.  ?  ?  ?09/2019 admitted with NSTEMI ?- had DES to LCX, during procedure VT requiring defib ?- Echo showed LVEF of 40-45% ?  ?  ?09/2020 ER visit with chest pain ?- trop neg x 2, EKG SR with frequent PVCs ?- he was at work, feeling of heart skipping. Had some SOB, pain across chest. Evaluated by his work medical provider,tele showed PVCs.  ?- in ER symptoms resolved with resolution of PVCs. K was 3.2, Mg 2 ?- still with symptoms at times.  ?- taking metoprolol '150mg'$  bid ?-SOB on brillinta ? ? ? 02/2021 cath: nonobstructive CAD, patent stents ?- no specific exertional chest pains.  ? ?2. Chronic systolic HF ?03/1912 echo: 40-45% ?02/2021 echo LVEF 30-35% ? 02/2021 cath: nonobstructive CAD, patent stents ?  ?- entresto felt dizzy, and weak. He stopped taking ? ?  ?  ?  ?3. HTN   ?-stopped entresto due to side effects, ran out of norvasc ?  ?4. Hyperlipidemia   ?- he is on statin ?  ?5. COPD ?- abnormal PFTs in 2015 ?- mixed compliance with inhalers.  ?- pcp started trelegy just today ?  ?6. PVCs ?- long history of PVCs ?- frequent PVCs noted during recent admission, beta blocker titrated up ?- ongoing symptoms of palpitations, heart fluttering with some chest discomfort ? ?- 05/2021 monitor frequent PVCs (5.7% burden), 9 runs of NSVT lognest 6 beats ?- ongoing symptoms,  daily.  ?- cutting back on EtOH, cigarette ? 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 ? ?Past Medical History:  ?Diagnosis Date  ? Cardiogenic shock ([place]) 01/13/2013  ? COPD (chronic obstructive pulmonary disease) ([place])   ? Coronary artery disease   ? a. 12/2012 Inf STEMI/Cath/PCI: LM nl, LAD nl, D1 50ost, D2 min irregs, D3 small, LCX 80-41m OM1/2/3 min irregs, RI 20p, RCA 50p/100d (3.5x18 Xience DEs),PDA/PL nl, EF 578%- complicated by VF/CGS/IABP/VDRF  ? GERD (gastroesophageal reflux disease)   ? Hiatal hernia   ? Hypertension   ? Ischemic cardiomyopathy   ? a. 12/2012 Echo: EF 45-50%, basal inf, inflat, mid inf HK, mildly reduced RV fxn.  ? Marijuana abuse   ? [place] ([place]ocardial infarction) [place]  ? [place]-ST elevated [place] [place]  ? Obstructive sleep apnea   ? ST elevation myocardial infarction (STEMI) of inferior wall, initial episode of care ([facility] 01/16/2013  ? Tobacco abuse   ? [place]ricular fibrillation ([place] 01/17/2013  ? ? ? ?No Known Allergies ? ? ?Current Outpatient Medications  ?Medication Sig Dispense Refill  ? albuterol (PROVENTIL) (2.5 MG/3ML) 0.083% nebulizer solution Take 3 mLs (2.5 mg total) by nebulization every 2 (two) hours as needed for wheezing or shortness of breath. 75 mL 12  ? albuterol (VENTOLIN HFA) 108 (  90 Base) MCG/ACT inhaler Inhale 2 puffs into the lungs every 4 (four) hours as needed for wheezing or shortness of breath. 18 g 5  ? amLODipine (NORVASC) 10 MG tablet Take 1 tablet (10 mg total) by mouth daily. 90 tablet 3  ? aspirin EC 81 MG EC tablet Take 1 tablet (81 mg total) by mouth daily with breakfast. Swallow whole. 30 tablet 11  ? atorvastatin (LIPITOR) 80 MG tablet Take 1 tablet (80 mg total) by mouth daily. 90 tablet 5  ? Fluticasone-Umeclidin-Vilant (TRELEGY ELLIPTA) 100-62.5-25 MCG/ACT AEPB Inhale 1 puff into the lungs daily. 1 each 11  ? furosemide (LASIX) 20 MG tablet Take 1  tablet (20 mg total) by mouth every other day. 30 tablet 11  ? losartan (COZAAR) 100 MG tablet Take 1 tablet (100 mg total) by mouth daily. 90 tablet 5  ? metoprolol succinate (TOPROL-XL) 100 MG 24 hr tablet Take 1.5 tablets (150 mg total) by mouth daily. Take with or immediately following a meal. 135 tablet 3  ? nicotine (NICODERM CQ - DOSED IN MG/24 HOURS) 21 mg/24hr patch Place 1 patch (21 mg total) onto the skin daily. (Patient not taking: Reported on 07/19/2021) 28 patch 0  ? nitroGLYCERIN (NITROSTAT) 0.4 MG SL tablet Place 1 tablet (0.4 mg total) under the tongue every 5 (five) minutes as needed for chest pain. 25 tablet 3  ? pantoprazole (PROTONIX) 40 MG tablet Take 40 mg by mouth daily.    ? potassium chloride (KLOR-CON) 10 MEQ tablet Take 1 tablet (10 mEq total) by mouth daily. Take While taking Lasix/furosemide (Patient taking differently: Take 10 mEq by mouth every other day. Take While taking Lasix/furosemide) 30 tablet 2  ? sacubitril-valsartan (ENTRESTO) 49-51 MG Take 1 tablet by mouth 2 (two) times daily. Please Honor Card patient is presenting for Timothy Walker: [id number]; Timothy Walker: [id number]; EHMCN: OHS; OBSJ: [id number] (Patient not taking: Reported on 07/19/2021) 180 tablet 1  ? sildenafil (VIAGRA) 50 MG tablet Take 1 tablet (50 mg total) by mouth daily as needed for erectile dysfunction. 10 tablet 2  ? ticagrelor (BRILINTA) 90 MG TABS tablet Take 1 tablet (90 mg total) by mouth 2 (two) times daily. (Patient taking differently: Take 90 mg by mouth daily at 6 (six) AM.) 180 tablet 1  ? ?No current facility-administered medications for this visit.  ? ? ? ?Past Surgical History:  ?Procedure Laterality Date  ? COLONOSCOPY WITH PROPOFOL N/A 05/14/2014  ? Dr. Gala Romney: anal canal hemorrhoid, rectosigmoid hyerpplastic polyp, right-sided divetriculosis   ? CORONARY ANGIOPLASTY  01/13/2013  ? STENT TO RCA BY DR COOPER  ? CORONARY STENT INTERVENTION N/A 10/07/2019  ? Procedure: CORONARY STENT INTERVENTION;  Surgeon:  Leonie Man, MD;  Location: [place] [facility];  Service: Cardiovascular;  Laterality: N/A;  ? CORONARY/GRAFT ACUTE MI REVASCULARIZATION N/A 10/07/2019  ? Procedure: Coronary/Graft Acute MI Revascularization;  Surgeon: Leonie Man, MD;  Location: [place] [facility];  Service: Cardiovascular;  Laterality: N/A;  ? DENTAL SURGERY    ? ESOPHAGEAL DILATION N/A 05/14/2014  ? Procedure: ESOPHAGEAL DILATION [place];  Surgeon: Daneil Dolin, MD;  [facility]: [facility];  Service: Endoscopy;  Laterality: N/A;  ? ESOPHAGOGASTRODUODENOSCOPY (EGD) WITH PROPOFOL N/A 05/14/2014  ? Dr. Gala Romney: empiric dilation, abnormal esophagus s/p biopsy (normal), small hiatal hernia  ? LEFT HEART CATH AND CORONARY ANGIOGRAPHY N/A 09/26/2018  ? Procedure: LEFT HEART CATH AND CORONARY ANGIOGRAPHY;  Surgeon: Lorretta Harp, MD;  Location: [place] [facility];  Service:  Cardiovascular;  Laterality: N/A;  ? LEFT HEART CATH AND CORONARY ANGIOGRAPHY N/A 10/07/2019  ? Procedure: LEFT HEART CATH AND CORONARY ANGIOGRAPHY;  Surgeon: Leonie Man, MD;  Location: [place] [facility];  Service: Cardiovascular;  Laterality: N/A;  ? LEFT HEART CATHETERIZATION WITH CORONARY ANGIOGRAM N/A 01/13/2013  ? Procedure: LEFT HEART CATHETERIZATION WITH CORONARY ANGIOGRAM;  Surgeon: Wellington Hampshire, MD;  Location: [place] CATH LAB;  Service: Cardiovascular;  Laterality: N/A;  ? NASAL SEPTUM SURGERY    ? PERCUTANEOUS CORONARY STENT INTERVENTION (PCI-S)  01/13/2013  ? Procedure: PERCUTANEOUS CORONARY STENT INTERVENTION (PCI-S);  Surgeon: Wellington Hampshire, MD;  Location: [facility] CATH LAB;  Service: Cardiovascular;;  ? POLYPECTOMY  05/14/2014  ? Procedure: POLYPECTOMY;  Surgeon: Daneil Dolin, MD;  Location: [facility];  Service: Endoscopy;;  ? RIGHT/LEFT HEART CATH AND CORONARY ANGIOGRAPHY N/A 03/16/2021  ? Procedure: RIGHT/LEFT HEART CATH AND CORONARY ANGIOGRAPHY;  Surgeon: Martinique, Peter M, MD;  Location: [place] [facility];  Service: Cardiovascular;  Laterality: N/A;   ? ? ? ?No Known Allergies ? ? ? ?Family History  ?Problem Relation Age of Onset  ? Heart attack Other   ? Hypertension Other   ? Colon cancer Neg Hx   ? ? ? ?Social History ?Mr. Timothy Walker reports that he has been s

## 2021-07-19 NOTE — Patient Instructions (Addendum)
Medication Instructions:  ?Stop Losartan (Cozaar) ?Stop Amlodipine (Norvasc) ?Stop Brilinta (Ticagrelor) ?Take 1/2 tab of your Entersto ?Be sure to take the Toprol '150mg'$  DAILY  ?Nitroglycerin refilled today ?Continue all other medications.    ? ?Labwork: ?none ? ?Testing/Procedures: ?none ? ?Follow-Up: ?3 weeks ? ?Any Other Special Instructions Will Be Listed Below (If Applicable). ?Please call the office on Friday with update on how feeling.   ? ?If you need a refill on your cardiac medications before your next appointment, please call your pharmacy. ? ?

## 2021-07-19 NOTE — Assessment & Plan Note (Signed)
Well controlled with pantoprazole 

## 2021-07-19 NOTE — Assessment & Plan Note (Signed)
S/p stent placement ?On DAPT and statin ?On beta-blocker ?Has nitroglycerin as needed for chest pain ?Followed by cardiology ?

## 2021-07-19 NOTE — Progress Notes (Signed)
? ?New Patient Office Visit ? ?Subjective:  ?Patient ID: Timothy Walker, male    DOB: [birth year]  Age: 56 y.o. MRN: [id number] ? ?CC:  ?Chief Complaint  ?Patient presents with  ? New Patient (Initial Visit)  ?  New patient was seeing dr Luan Pulling just establishing care does have cardiology appt today   ? ? ?HPI ?Timothy Walker is a 56 y.o. male with past medical history of CAD status post stent placement, HFrEF, HTN, COPD, GERD and tobacco abuse who presents for establishing care. ? ?CAD s/p stent placement, HFrEF and HTN:  His BP was elevated in the office today.  He has been taking losartan, amlodipine and metoprolol for history of CAD and HTN.  He was recently placed on Entresto for his HFrEF, but he did not tolerate it.  He denies any headache, dizziness or chest pain currently.  He has intermittent palpitations despite taking Metoprolol.  He admits that he takes about 2 shots of bourbon almost every day and he needs to cut down on it.  He had cardiac monitor for evaluation, which showed PSVT.  He is on DAPT and statin currently for CAD. ? ?COPD: He uses albuterol inhaler and nebulizer as needed for dyspnea or wheezing.  He used to use Incruse for COPD, but was not able to afford it.  He smokes about 0.5 pack/day. ? ?GERD: He takes pantoprazole for it. ? ?He complains of erectile dysfunction, and has tried taking sildenafil from one of his friend.  He agrees to avoid taking it with nitroglycerin, which he has not required recently. ? ?He complains of b/l ear fullness, but denies any ear pain or discharge currently.  He has history of cerumen impaction in the past. ? ?Past Medical History:  ?Diagnosis Date  ? Cardiogenic shock ([place]) 01/13/2013  ? COPD (chronic obstructive pulmonary disease) ([place])   ? Coronary artery disease   ? a. 12/2012 Inf STEMI/Cath/PCI: LM nl, LAD nl, D1 50ost, D2 min irregs, D3 small, LCX 80-76m OM1/2/3 min irregs, RI 20p, RCA 50p/100d (3.5x18 Xience DEs),PDA/PL nl, EF 590%- complicated by  VF/CGS/IABP/VDRF  ? GERD (gastroesophageal reflux disease)   ? Hiatal hernia   ? Hypertension   ? Ischemic cardiomyopathy   ? a. 12/2012 Echo: EF 45-50%, basal inf, inflat, mid inf HK, mildly reduced RV fxn.  ? Marijuana abuse   ? MI (myocardial infarction) ([place] 2014  ? NSTEMI (non-ST elevated myocardial infarction) ([place] 10/07/2019  ? Obstructive sleep apnea   ? ST elevation myocardial infarction (STEMI) of inferior wall, initial episode of care ([facility] 01/16/2013  ? Tobacco abuse   ? [place]/17/2013  ? ? ?Past Surgical History:  ?Procedure Laterality Date  ? COLONOSCOPY WITH PROPOFOL N/A 05/14/2014  ? Dr. RGala Romney anal canal hemorrhoid, rectosigmoid hyerpplastic polyp, right-sided divetriculosis   ? CORONARY ANGIOPLASTY  01/13/2013  ? STENT TO RCA BY DR COOPER  ? CORONARY STENT INTERVENTION N/A 10/07/2019  ? Procedure: CORONARY STENT INTERVENTION;  Surgeon: HLeonie Man MD;  [place];  Service: Cardiovascular;  Laterality: N/A;  ? CORONARY/GRAFT ACUTE MI REVASCULARIZATION N/A 10/07/2019  ? Procedure: Coronary/Graft Acute MI Revascularization;  Surgeon: HLeonie Man MD;  [place] [facility];  Service: Cardiovascular;  Laterality: N/A;  ? DENTAL SURGERY    ? ESOPHAGEAL DILATION N/A 05/14/2014  ? Procedure: ES[place]HAGEAL DILATION [place]  Surgeon: RDaneil Dolin MD;  [facility];  Service: Endoscopy;  Laterality: N/A;  ?  ESOPHAGOGASTRODUODENOSCOPY (EGD) WITH PROPOFOL N/A 05/14/2014  ? Dr. Gala Romney: empiric dilation, abnormal esophagus s/p biopsy (normal), small hiatal hernia  ? LEFT HEART CATH AND CORONARY ANGIOGRAPHY N/A 09/26/2018  ? Procedure: LEFT HEART CATH AND CORONARY ANGIOGRAPHY;  Surgeon: Lorretta Harp, MD;  Location: [place] [facility];  Service: Cardiovascular;  Laterality: N/A;  ? LEFT HEART CATH AND CORONARY ANGIOGRAPHY N/A 10/07/2019  ? Procedure: LEFT HEART CATH AND CORONARY ANGIOGRAPHY;  Surgeon: Leonie Man, MD;  Location: [place] CV LAB;  Service: Cardiovascular;  Laterality: N/A;  ? LEFT HEART CATHETERIZATION WITH CORONARY ANGIOGRAM N/A 01/13/2013  ? Procedure: LEFT HEART CATHETERIZATION WITH CORONARY ANGIOGRAM;  Surgeon: Wellington Hampshire, MD;  Location: [place] CATH LAB;  Service: Cardiovascular;  Laterality: N/A;  ? NASAL SEPTUM SURGERY    ? PERCUTANEOUS CORONARY STENT INTERVENTION (PCI-S)  01/13/2013  ? Procedure: PERCUTANEOUS CORONARY STENT INTERVENTION (PCI-S);  Surgeon: Wellington Hampshire, MD;  Location: [facility] CATH LAB;  Service: Cardiovascular;;  ? POLYPECTOMY  05/14/2014  ? Procedure: POLYPECTOMY;  Surgeon: Daneil Dolin, MD;  [facility];  Service: Endoscopy;;  ? RIGHT/LEFT HEART CATH AND CORONARY ANGIOGRAPHY N/A 03/16/2021  ? Procedure: RIGHT/LEFT HEART CATH AND CORONARY ANGIOGRAPHY;  Surgeon: Martinique, Peter M, MD;  Location: [place] [facility];  Service: Cardiovascular;  Laterality: N/A;  ? ? ?Family History  ?Problem Relation Age of Onset  ? Heart attack Other   ? Hypertension Other   ? Colon cancer Neg Hx   ? ? ?Social History  ? ?Socioeconomic History  ? Marital status: Divorced  ?  Spouse name: Not on file  ? Number of children: Not on file  ? Years of education: Not on file  ? Highest education level: Not on file  ?Occupational History  ? Not on file  ?Tobacco Use  ? Smoking status: Every Day  ?  Packs/day: 0.75  ?  Years: 31.00  ?  Pack years: 23.25  ?  Types: Cigarettes  ?  Start date: 11/21/1981  ? Smokeless tobacco: Never  ?Vaping Use  ? Vaping Use: Some days  ? Substances: Nicotine, CBD  ?Substance and Sexual Activity  ? Alcohol use: Not Currently  ?  Comment: 2-4 shots of burbon daily, if off work may be a little more  ? Drug use: Yes  ?  Types: Marijuana  ?  Comment: daily  ? Sexual activity: Yes  ?Other Topics Concern  ? Not on file  ?Social History Narrative  ? Not on file  ? ?Social Determinants of Health  ? ?Financial Resource Strain: Not on file  ?Food Insecurity: Not on file  ?Transportation Needs: Not on file   ?Physical Activity: Not on file  ?Stress: Not on file  ?Social Connections: Not on file  ?Intimate Partner Violence: Not on file  ? ? ?ROS ?Review of Systems  ?Constitutional:  Negative for chills and fever.  ?HENT:  Negative for congestion and sore throat.   ?     Ear fullness  ?Eyes:  Negative for pain and discharge.  ?Respiratory:  Positive for shortness of breath and wheezing. Negative for cough.   ?Cardiovascular:  Positive for palpitations. Negative for chest pain.  ?Gastrointestinal:  Negative for diarrhea, nausea and vomiting.  ?Endocrine: Negative for polydipsia and polyuria.  ?Genitourinary:  Negative for dysuria and hematuria.  ?Musculoskeletal:  Negative for neck pain and neck stiffness.  ?Skin:  Negative for rash.  ?Neurological:  Negative for dizziness, weakness, numbness and headaches.  ?Psychiatric/Behavioral:  Negative for agitation and behavioral problems.   ? ?Objective:  ? ?Today's Vitals: BP (!) 142/108 (BP Location: Right Arm, Cuff Size: Normal)   Pulse 89   Resp 18   Ht '5\' 11"'$  (1.803 m)   Wt 186 lb 12.8 oz (84.7 kg)   SpO2 99%   BMI 26.05 kg/m?  ? ?Physical Exam ?Vitals reviewed.  ?Constitutional:   ?   General: He is not in acute distress. ?   Appearance: He is not diaphoretic.  ?HENT:  ?   Head: Normocephalic and atraumatic.  ?   Nose: Nose normal.  ?   Mouth/Throat:  ?   Mouth: Mucous membranes are moist.  ?Eyes:  ?   General: No scleral icterus. ?   Extraocular Movements: Extraocular movements intact.  ?Cardiovascular:  ?   Rate and Rhythm: Normal rate and regular rhythm.  ?   Pulses: Normal pulses.  ?   Heart sounds: Normal heart sounds. No murmur heard. ?Pulmonary:  ?   Breath sounds: Normal breath sounds. No wheezing or rales.  ?Musculoskeletal:  ?   Cervical back: Neck supple. No tenderness.  ?   Right lower leg: No edema.  ?   Left lower leg: No edema.  ?Skin: ?   General: Skin is warm.  ?   Findings: No rash.  ?Neurological:  ?   General: No focal deficit present.  ?   Mental  Status: He is alert and oriented to person, place, and time.  ?Psychiatric:     ?   Mood and Affect: Mood normal.     ?   Behavior: Behavior normal.  ? ? ?Assessment & Plan:  ? ?Problem List Items Addressed Th

## 2021-07-19 NOTE — Patient Instructions (Signed)
Please continue taking medications as prescribed. ? ?Please continue to follow low salt diet and ambulate as tolerated. ? ?Please start using Trelegy for COPD. Please use Albuterol as needed for shortness of breath or wheezing. ? ?Please try to cut down -> quit smoking. ?

## 2021-07-19 NOTE — Assessment & Plan Note (Signed)
Smokes about 0.5 pack/day  Asked about quitting: confirms that he/she currently smokes cigarettes Advise to quit smoking: Educated about QUITTING to reduce the risk of cancer, cardio and cerebrovascular disease. Assess willingness: Unwilling to quit at this time, but is working on cutting back. Assist with counseling and pharmacotherapy: Counseled for 5 minutes and literature provided. Arrange for follow up: follow up in 3 months and continue to offer help. 

## 2021-07-19 NOTE — Telephone Encounter (Signed)
Patient came  back in office in regard to visit on today  ? ?Patient forgot to mention mental health referral for depression. ? ?Patient would like a referral placed in regard. ? ?Patient also wants a call back. ?

## 2021-07-19 NOTE — Assessment & Plan Note (Signed)
BP Readings from Last 1 Encounters:  ?07/19/21 (!) 142/108  ? ?uncontrolled with losartan 100 mg daily, amlodipine 10 mg daily and metoprolol 150 mg twice daily ?Did not tolerate Entresto -advised to discuss with cardiology ?Counseled for compliance with the medications ?Advised DASH diet and moderate exercise/walking, at least 150 mins/week ? ?

## 2021-07-19 NOTE — Assessment & Plan Note (Signed)
Bilateral ear irrigation done today ?

## 2021-07-19 NOTE — Assessment & Plan Note (Signed)
Uncontrolled with as needed albuterol ?Added Trelegy as maintenance therapy ?

## 2021-07-20 NOTE — Telephone Encounter (Signed)
LVM letting pt know referral has been placed ?

## 2021-08-11 ENCOUNTER — Encounter: Payer: Self-pay | Admitting: Cardiology

## 2021-08-11 ENCOUNTER — Ambulatory Visit: Payer: BC Managed Care – PPO | Admitting: Cardiology

## 2021-08-11 VITALS — BP 140/96 | HR 90 | Ht 71.0 in | Wt 186.4 lb

## 2021-08-11 DIAGNOSIS — I2510 Atherosclerotic heart disease of native coronary artery without angina pectoris: Secondary | ICD-10-CM | POA: Diagnosis not present

## 2021-08-11 DIAGNOSIS — I5022 Chronic systolic (congestive) heart failure: Secondary | ICD-10-CM | POA: Diagnosis not present

## 2021-08-11 DIAGNOSIS — I493 Ventricular premature depolarization: Secondary | ICD-10-CM | POA: Diagnosis not present

## 2021-08-11 NOTE — Patient Instructions (Addendum)
Medication Instructions:  Your physician recommends that you continue on your current medications as directed. Please refer to the Current Medication list given to you today.   Labwork: None  Testing/Procedures: None  Follow-Up: Follow up with Dr. Harl Bowie in 1 month. - We will call you with an appointment date.   Any Other Special Instructions Will Be Listed Below (If Applicable).     If you need a refill on your cardiac medications before your next appointment, please call your pharmacy.

## 2021-08-11 NOTE — Progress Notes (Signed)
Clinical Summary Mr. Baccam is a 56 y.o.maleseen today for follow up of the following medical problems.     1. CAD   - admit to [facility] 01/13/13 to 01/17/13 with STEMI, DES to RCA.   - patient presented in cardiogenic shock, suffered VT/VF arrest requiring multiple defibrillations and IV amiodarone. Following PCI required balloon pump and vasopressors and temporary transvenous pacing.   - Echo LVEF 45-50%, inferior hypokinesis, normal diastolic function        05/5571 cath mid LAD 30%, ramus 30%, LCX mid 90% small nondom and stable, RCA 30% prox. 09/2018 echo LVEF 22-02%, grade I diastolic dysfunction, basal mid inferior akinesis.      09/2019 admitted with NSTEMI - had DES to LCX, during procedure VT requiring defib - Echo showed LVEF of 40-45%     09/2020 ER visit with chest pain - trop neg x 2, EKG SR with frequent PVCs - he was at work, feeling of heart skipping. Had some SOB, pain across chest. Evaluated by his work medical provider,tele showed PVCs.  - in ER symptoms resolved with resolution of PVCs. K was 3.2, Mg 2 - still with symptoms at times.  - taking metoprolol '150mg'$  bid -SOB on brillinta      02/2021 cath: nonobstructive CAD, patent stents - no specific exertional chest pains.  - 3 episodes of sharp pain left sided. 6-7/10, +nausea, sweaty. Pain lasted 5 minutes.  Mixed compliance with beta blocker     2. Chronic systolic HF 07/4268 echo: 62-37% 02/2021 echo LVEF 30-35%  02/2021 cath: nonobstructive CAD, patent stents   - entresto caused dizziness, last visit we tried lowering dose to 24/'26mg'$  bid - forgetting afternoon meds at times - no dizziness - taking toprol '100mg'$  and '50mg'$  in afternoon. Missing afternoon dose often.        Other medical issues not addressed this visit.      3. HTN   -stopped entresto due to side effects, ran out of norvasc   4. Hyperlipidemia   - he is on statin   5. COPD - abnormal PFTs in 2015 - mixed compliance  with inhalers.  - pcp started trelegy just today   6. PVCs - long history of PVCs - frequent PVCs noted during recent admission, beta blocker titrated up - ongoing symptoms of palpitations, heart fluttering with some chest discomfort   - 05/2021 monitor frequent PVCs (5.7% burden), 9 runs of NSVT lognest 6 beats - ongoing symptoms, daily.  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 12/2012 Inf STEMI/Cath/PCI: LM nl, LAD nl, D1 50ost, D2 min irregs, D3 small, LCX 80-48m OM1/2/3 min irregs, RI 20p, RCA 50p/100d (3.5x18 Xience DEs),PDA/PL nl, EF 562%- complicated by VF/CGS/IABP/VDRF   GERD (gastroesophageal reflux disease)    Hiatal hernia    Hypertension    Ischemic cardiomyopathy    a. 12/2012 Echo: EF 45-50%, basal inf, inflat, mid inf HK, mildly reduced RV fxn.   Marijuana abuse    MI (myocardial infarction) ([facility] 2014   NSTEMI (non-ST elevated myocardial infarction) ([facility] 10/07/2019   Obstructive sleep apnea    ST elevation myocardial infarction (STEMI) of inferior wall, initial episode of care ([facility] 01/16/2013  Tobacco abuse    Ventricular fibrillation ([facility]) 01/17/2013     No Known Allergies   Current Outpatient Medications  Medication Sig Dispense Refill   albuterol (PROVENTIL) (2.5 MG/3ML) 0.083% nebulizer solution Take 3 mLs (2.5 mg total) by nebulization every 2 (two) hours as needed for wheezing or shortness of breath. 75 mL 12   albuterol (VENTOLIN HFA) 108 (90 Base) MCG/ACT inhaler Inhale 2 puffs into the lungs every 4 (four) hours as needed for wheezing or shortness of breath. 18 g 5   aspirin EC 81 MG EC tablet Take 1 tablet (81 mg total) by mouth daily with breakfast. Swallow whole. 30 tablet 11    atorvastatin (LIPITOR) 80 MG tablet Take 1 tablet (80 mg total) by mouth daily. 90 tablet 5   Fluticasone-Umeclidin-Vilant (TRELEGY ELLIPTA) 100-62.5-25 MCG/ACT AEPB Inhale 1 puff into the lungs daily. 1 each 11   furosemide (LASIX) 20 MG tablet Take 1 tablet (20 mg total) by mouth every other day. 30 tablet 11   metoprolol succinate (TOPROL-XL) 100 MG 24 hr tablet Take 1.5 tablets (150 mg total) by mouth daily. Take with or immediately following a meal. 135 tablet 3   nicotine (NICODERM CQ - DOSED IN MG/24 HOURS) 21 mg/24hr patch Place 1 patch (21 mg total) onto the skin daily. (Patient not taking: Reported on 07/19/2021) 28 patch 0   nitroGLYCERIN (NITROSTAT) 0.4 MG SL tablet Place 1 tablet (0.4 mg total) under the tongue every 5 (five) minutes as needed for chest pain. 25 tablet 3   pantoprazole (PROTONIX) 40 MG tablet Take 40 mg by mouth daily.     potassium chloride (KLOR-CON) 10 MEQ tablet Take 10 mEq by mouth every other day.     sacubitril-valsartan (ENTRESTO) 49-51 MG Take 0.5 tablets by mouth 2 (two) times daily. Please Honor Card patient is presenting for [name]: [id number]; [name]: [id number]; VXBLT: OHS; JQZE: [phone]     sildenafil (VIAGRA) 50 MG tablet Take 1 tablet (50 mg total) by mouth daily as needed for erectile dysfunction. 10 tablet 2   No current facility-administered medications for this visit.     Past Surgical History:  Procedure Laterality Date   COLONOSCOPY WITH PROPOFOL N/A 05/14/2014   Dr. Gala Romney: anal canal hemorrhoid, rectosigmoid hyerpplastic polyp, right-sided divetriculosis    CORONARY ANGIOPLASTY  01/13/2013   STENT TO RCA BY DR Burt Knack   CORONARY STENT INTERVENTION N/A 10/07/2019   Procedure: CORONARY STENT INTERVENTION;  Surgeon: Leonie Man, MD;  Location: [place] [facility];  Service: Cardiovascular;  Laterality: N/A;   CORONARY/GRAFT ACUTE MI REVASCULARIZATION N/A 10/07/2019   Procedure: Coronary/Graft Acute MI Revascularization;  Surgeon:  Leonie Man, MD;  Location: [place] [facility];  Service: Cardiovascular;  Laterality: N/A;   DENTAL SURGERY     ESOPHAGEAL DILATION N/A 05/14/2014   Procedure: ESOPHAGEAL DILATION [place];  Surgeon: Daneil Dolin, MD;  Location: [facility];  Service: Endoscopy;  Laterality: N/A;   ESOPHAGOGASTRODUODENOSCOPY (EGD) WITH PROPOFOL N/A 05/14/2014   Dr. Gala Romney: empiric dilation, abnormal esophagus s/p biopsy (normal), small hiatal hernia   LEFT HEART CATH AND CORONARY ANGIOGRAPHY N/A 09/26/2018   Procedure: LEFT HEART CATH AND CORONARY ANGIOGRAPHY;  Surgeon: Lorretta Harp, MD;  Location: [place] [facility];  Service: Cardiovascular;  Laterality: N/A;   LEFT HEART CATH AND CORONARY ANGIOGRAPHY N/A 10/07/2019   Procedure: LEFT HEART CATH AND CORONARY ANGIOGRAPHY;  Surgeon: Leonie Man, MD;  Location: [place] [facility];  Service: Cardiovascular;  Laterality: N/A;   LEFT HEART CATHETERIZATION WITH CORONARY ANGIOGRAM N/A 01/13/2013   Procedure: LEFT HEART CATHETERIZATION WITH CORONARY ANGIOGRAM;  Surgeon: Wellington Hampshire, MD;  Location: [place] [facility];  Service: Cardiovascular;  Laterality: N/A;   NASAL SEPTUM SURGERY     PERCUTANEOUS CORONARY STENT INTERVENTION (PCI-S)  01/13/2013   Procedure: PERCUTANEOUS CORONARY STENT INTERVENTION (PCI-S);  Surgeon: Wellington Hampshire, MD;  [facility];  Service: Cardiovascular;;   POLYPECTOMY  05/14/2014   Procedure: POLYPECTOMY;  Surgeon: Daneil Dolin, MD;  Location: [facility];  Service: Endoscopy;;   RIGHT/LEFT HEART CATH AND CORONARY ANGIOGRAPHY N/A 03/16/2021   Procedure: RIGHT/LEFT HEART CATH AND CORONARY ANGIOGRAPHY;  Surgeon: Martinique, Peter M, MD;  Location: [place] [facility];  Service: Cardiovascular;  Laterality: N/A;     No Known Allergies    Family History  Problem Relation Age of Onset   Heart attack Other    Hypertension Other    Colon cancer Neg Hx      Social History Mr. Antilla reports that he has been smoking  cigarettes. He started smoking about 39 years ago. He has a 23.25 pack-year smoking history. He has never used smokeless tobacco. Mr. Skelton reports that he does not currently use alcohol.   Review of Systems CONSTITUTIONAL: No weight loss, fever, chills, weakness or fatigue.  HEENT: Eyes: No visual loss, blurred vision, double vision or yellow sclerae.No hearing loss, sneezing, congestion, runny nose or sore throat.  SKIN: No rash or itching.  CARDIOVASCULAR: per hpi RESPIRATORY: No shortness of breath, cough or sputum.  GASTROINTESTINAL: No anorexia, nausea, vomiting or diarrhea. No abdominal pain or blood.  GENITOURINARY: No burning on urination, no polyuria NEUROLOGICAL: No headache, dizziness, syncope, paralysis, ataxia, numbness or tingling in the extremities. No change in bowel or bladder control.  MUSCULOSKELETAL: No muscle, back pain, joint pain or stiffness.  LYMPHATICS: No enlarged nodes. No history of splenectomy.  PSYCHIATRIC: No history of depression or anxiety.  ENDOCRINOLOGIC: No reports of sweating, cold or heat intolerance. No polyuria or polydipsia.  Marland Kitchen   Physical Examination Today's Vitals   08/11/21 1150  BP: (!) 140/96  Pulse: 90  SpO2: 98%  Weight: 186 lb 6.4 oz (84.6 kg)  Height: '5\' 11"'$  (1.803 m)   Body mass index is 26 kg/m.  Gen: resting comfortably, no acute distress HEENT: no scleral icterus, pupils equal round and reactive, no palptable cervical adenopathy,  CV: RRR, no m/r/g no jvd Resp: Clear to auscultation bilaterally GI: abdomen is soft, non-tender, non-distended, normal bowel sounds, no hepatosplenomegaly MSK: extremities are warm, no edema.  Skin: warm, no rash Neuro:  no focal deficits Psych: appropriate affect   Diagnostic Studies  09/2018 echo IMPRESSIONS      1. The left ventricle has mildly reduced systolic function, with an ejection fraction of 45-50%. The cavity size was mildly dilated. There is mildly increased left  ventricular wall thickness. Left ventricular diastolic Doppler parameters are consistent with impaired relaxation.  2. There is akinesis of the left ventricular, basal-mid inferior wall.  3. The right ventricle has normal systolic function. The cavity was normal. There is no increase in right ventricular wall thickness. Right ventricular systolic pressure is mildly elevated with an estimated pressure of 39.8 mmHg.  4. The mitral valve is grossly normal. There is mild mitral regurgitation.  5. The tricuspid valve is grossly normal.  6. The aortic valve is tricuspid.  7. The aortic root is normal in size and structure.  8. The inferior vena cava was dilated in size with >50% respiratory variability.   09/2018 cath Previously placed Mid RCA to Dist RCA stent (unknown type) is widely patent. Mid Cx lesion is 90% stenosed. Mid LAD lesion is 30% stenosed. Prox RCA lesion is 30% stenosed. Ramus lesion is 30% stenosed. There is moderate left ventricular systolic dysfunction. LV end diastolic pressure is mildly elevated. The left ventricular ejection fraction is 35-45% by visual estimate.   BURNETT SPRAY is a 56 y.o. male IMPRESSION: Mr. Lovingood distal dominant RCA stent was widely patent.  He had high-grade lesion in a nondominant circumflex similar as described at the cath in 2014.  He had moderate LV dysfunction with an EF in the 40% range and severe inferior hypokinesia.  I do not see a culprit vessel new since his previous cath.  He does have moderate LV dysfunction and medical therapy will will be recommended for this.  The sheath was removed and a TR band was placed on the right wrist to achieve patent hemostasis.  The patient left the lab in stable condition.  He can be discharged home later today with TOC 7 followed by return office visit with Dr. Harl Bowie in 3 to 4 weeks.   09/2019 cath Left Heart Catheterization 10/07/2019: There is mild left ventricular systolic dysfunction. The left  ventricular ejection fraction is 45-50% by visual LV end diastolic pressure is normal. Coronary anatomy: Prox RCA lesion is 30% stenosed. Previously placed Mid RCA to Dist RCA stent (unknown type) is widely patent. Ramus lesion is 30% stenosed. Prox Cx to Dist Cx lesion is 100% stenosed with 99% stenosed side Shawnae Leiva in 1st Mrg. LPAV lesion is 80% stenosed. A drug-eluting stent was successfully placed crossing from the small 1st Mrg into the LCx-2nd Mrg, using a SYNERGY XD 2.50X20. Post intervention, there is a 0% residual stenosis. Post intervention, the side Jeannia Tatro was reduced to 99% residual stenosis. A drug-eluting stent was successfully placed using a SYNERGY XD 2.50X20.   Summary: Two-Vessel Disease: With widely patent distal RCA stent and Toprol lesion being 100% proximal to mid LCx occlusion proximal to very small caliber 1st Mrg & AVG LCx. Difficult lesion to cross, but successful PTCA and DES PCI of the acutely occluded LCx using Synergy DES 2.5 mm x 20 mm-postdilated to 3.1-2.9 mm Mild proximal RCA and LAD disease. Mildly reduced EF of 45% with lateral hypokinesis. Sustained ventricular tachycardia related to angiography--s/p 1 shock DCCV restoring sinus rhythm    Recommendations: Restart postop medications but will reduce beta-blocker in half starting tonight. ->  Will need to determine appropriateness of home doses, but will dose other medications as of tomorrow. Continue home statin Cardiac rehab recommendation/consultation team to see the patient from hospital.   Echocardiogram 10/08/2019: Impressions:  1. Left ventricular ejection fraction, by estimation, is 40 to 45%. The  left ventricle has mildly decreased function. The left ventricle has no  regional wall motion abnormalities. There is moderate asymmetric left  ventricular hypertrophy of the  anteroseptum segment. Left ventricular diastolic parameters are consistent  with Grade I diastolic dysfunction (impaired  relaxation). Diffuse mild  hypokinesis of the basal to mid segments, slightly worse in the  inferolateral myocardium. There is relative  sparing of the apical segments.   2. Right ventricular systolic function is normal. The right ventricular  size is normal.   3. Right atrial size was moderately dilated.   4. The mitral valve is normal in structure. Mild mitral valve  regurgitation.  No evidence of mitral stenosis.   5. The aortic valve is tricuspid. Aortic valve regurgitation is not  visualized. No aortic stenosis is present.   6. Aortic dilatation noted. There is mild dilatation of the ascending  aorta measuring 37 mm.   7. The inferior vena cava is normal in size with greater than 50%  respiratory variability, suggesting right atrial pressure of 3 mmHg.     02/2021 echo IMPRESSIONS     1. Left ventricular ejection fraction, by estimation, is 30 to 35%. The  left ventricle has moderately decreased function. The left ventricle  demonstrates regional wall motion abnormalities (see scoring  diagram/findings for description). The basal-to-mid   inferior wall is akinetic. The rest of the LV segments are hypokinetic.  The left ventricular internal cavity size was moderately dilated. There is  mild concentric left ventricular hypertrophy. Left ventricular diastolic  parameters are consistent with  Grade II diastolic dysfunction (pseudonormalization).   2. Right ventricular systolic function is normal. The right ventricular  size is normal. Tricuspid regurgitation signal is inadequate for assessing  PA pressure.   3. Left atrial size was severely dilated.   4. The mitral valve is normal in structure. Mild mitral valve  regurgitation.   5. The aortic valve is tricuspid. There is mild calcification of the  aortic valve. There is mild thickening of the aortic valve. Aortic valve  regurgitation is not visualized. Aortic valve sclerosis/calcification is  present, without any evidence of   aortic stenosis.   6. Aortic dilatation noted. There is mild dilatation of the aortic root,  measuring 38 mm.   7. The inferior vena cava is normal in size with greater than 50%  respiratory variability, suggesting right atrial pressure of 3 mmHg.   02/2021 RHC/LHC   Prox RCA lesion is 30% stenosed.   Ramus lesion is 30% stenosed.   LPAV lesion is 80% stenosed.   Non-stenotic Prox Cx to Dist Cx lesion with 99% stenosed side Chandi Nicklin in 1st Mrg was previously treated.   Non-stenotic Mid RCA to Dist RCA lesion was previously treated.   LV end diastolic pressure is normal.   Nonobstructive CAD. Prior stents in the RCA and LCx are patent.  Low LV filling pressures. PCWP 4 mm Hg, LVEDP 3 mm Hg Normal right heart pressures. PAP mean 11 mm Hg Normal cardiac output with index 2.76    Plan: will reduce lasix to every other day until seen back in office. Continue medical management.      05/2021 monitor 14 day monitor Rare supraventricular ectopy in the form of isolated PACs Frequent ventricular in the form of isolated PVCs (5.7%). Rare couplets and triplets. 9 runs of NSVT longest 6 beats Several patient triggered events however the presence of symptoms was not documented. Triggered events were correlated with sinus rhythm and PVCs and PACs     Patch Wear Time:  14 days and 0 hours (2023-01-31T13:41:31-0500 to 2023-02-14T13:41:35-0500)   Patient had a min HR of 50 bpm, max HR of 266 bpm, and avg HR of 83 bpm. Predominant underlying rhythm was Sinus Rhythm. 9 Ventricular Tachycardia runs occurred, the run with the fastest interval lasting 6 beats with a max rate of 266 bpm, the longest  lasting 4 beats with an avg rate of 127 bpm. Ventricular Tachycardia was detected within +/- 45 seconds of symptomatic patient event(s). Isolated SVEs were rare (<1.0%), and no SVE Couplets or SVE Triplets were present. Isolated VEs were frequent (5.7%,  95317), VE Couplets  were rare (<1.0%, 4461), and VE  Triplets were rare (<1.0%, 756). Ventricular Bigeminy and Trigeminy were present.      Assessment and Plan  1. CAD - recent cath as reported above without new significant disease - nonspecific chest pains at times, encouraged increased compliance with beta blocker as antianginal therapy.    2. Chronic systolic HF - side effects on mid dose entresto, will see if can tolerate lower dose 24/'26mg'$  bid - ongoing mixed compliance with medications limits ability to titrate. Discussed importance of taking medications daily as prescribed. Would look to titrate toprol over time, add aldactone and SGLT2i if can be consistently compliant with regimen. Likely would not titrate entresto further due to side effects.  - repeat echo once optimized on meds, may need ICD consideration in the future pending results.   3. Palpitations/PVCs -encouraged increased compliance with toprol, room to titrate further if needed.      Agree to extend his short term disability, he will submit paperwork     Arnoldo Lenis, M.D.

## 2021-09-07 ENCOUNTER — Ambulatory Visit: Payer: BC Managed Care – PPO | Admitting: Cardiology

## 2021-11-11 ENCOUNTER — Telehealth: Payer: Self-pay | Admitting: Cardiology

## 2021-11-11 NOTE — Telephone Encounter (Signed)
Spoke to pt and offered pt patient assistance for [employer]. Pt stated that he did not like taking the [facility] and had stopped taking it some time ago. Pt stated that he had done research on his own and started taking vitamins and otc supplements and stated that since doing so, he feels as if his heart is doing better. Pt would like to wait on taking Entresto. I will [place] provider.   Pt was also supplied with [employer] Assistance form and the number for governmental insurance ([email or web address]- pt does not have internet).

## 2021-11-11 NOTE — Telephone Encounter (Signed)
Pt came into the office stating that he's no longer employed and cannot afford his medications-   He has stopped his Entresto and he's not sure of what other meds he's out of.    He does not have ins to be able to come in for an office visit due to not being able to pay for it out of pocket.   Please call [phone]

## 2021-11-14 NOTE — Telephone Encounter (Signed)
Heart was very weak on last check, he really needs to complete assistance application so we can continue the appropriate care for him   Zandra Abts MD

## 2021-11-15 NOTE — Telephone Encounter (Signed)
Entresto application for financial assistance mailed to pt.

## 2021-11-22 ENCOUNTER — Encounter: Payer: BC Managed Care – PPO | Admitting: Internal Medicine

## 2022-03-03 ENCOUNTER — Telehealth: Payer: Self-pay | Admitting: Cardiology

## 2022-03-03 NOTE — Telephone Encounter (Signed)
Patient stated Timothy Walker from [facility] will be faxing paperwork to Dr. Nelly Laurence attention regarding the patient's short-term disability.

## 2022-03-27 DIAGNOSIS — Z419 Encounter for procedure for purposes other than remedying health state, unspecified: Secondary | ICD-10-CM | POA: Diagnosis not present

## 2022-04-27 ENCOUNTER — Telehealth: Payer: Self-pay | Admitting: Internal Medicine

## 2022-04-27 DIAGNOSIS — Z419 Encounter for procedure for purposes other than remedying health state, unspecified: Secondary | ICD-10-CM | POA: Diagnosis not present

## 2022-04-27 NOTE — Telephone Encounter (Signed)
Patient called, he is requesting a refill for Trelegy for COPD 133mg, 62.5 mcg, 242m [facility] [place] He stated that he is wanting to get an appointment scheduled soon, but he has been dealing with some physical and mental health issues.  Pt's # [phone]

## 2022-04-27 NOTE — Telephone Encounter (Signed)
Patient called back and stated that he actually has some refills, but the pharmacy stated that the insurance, [facility] will not pay for it, they are requiring a prior auth.  He stated that the insurance has faxed a form for that prior autho.

## 2022-04-28 NOTE — Telephone Encounter (Signed)
LVM pa was done.

## 2022-05-10 ENCOUNTER — Other Ambulatory Visit: Payer: Self-pay | Admitting: Cardiology

## 2022-06-01 ENCOUNTER — Other Ambulatory Visit: Payer: Self-pay | Admitting: Cardiology

## 2022-06-15 ENCOUNTER — Other Ambulatory Visit: Payer: Self-pay | Admitting: Cardiology

## 2022-06-29 ENCOUNTER — Other Ambulatory Visit: Payer: Self-pay | Admitting: Cardiology

## 2022-07-10 ENCOUNTER — Other Ambulatory Visit: Payer: Self-pay | Admitting: Internal Medicine

## 2022-07-11 ENCOUNTER — Telehealth: Payer: Self-pay | Admitting: Internal Medicine

## 2022-07-11 NOTE — Telephone Encounter (Signed)
Patient called need med refill  albuterol (PROVENTIL) (2.5 MG/3ML) 0.083% nebulizer solution   Fluticasone-Umeclidin-Vilant (TRELEGY ELLIPTA) 100-62.5-25 MCG/ACT AEPB [[id number]]   Pharmacy  [phone] [place] [place] - [address] AT [address] [address] [place], [place] [address] Phone: [phone]  Fax: [phone] DEA #: [id number]

## 2022-07-11 NOTE — Telephone Encounter (Signed)
Called patient lvm to call our office

## 2022-07-18 ENCOUNTER — Encounter: Payer: Self-pay | Admitting: Internal Medicine

## 2022-07-18 ENCOUNTER — Ambulatory Visit (INDEPENDENT_AMBULATORY_CARE_PROVIDER_SITE_OTHER): Payer: Medicaid Other | Admitting: Internal Medicine

## 2022-07-18 VITALS — BP 156/98 | HR 79 | Ht 71.0 in | Wt 194.8 lb

## 2022-07-18 DIAGNOSIS — E785 Hyperlipidemia, unspecified: Secondary | ICD-10-CM | POA: Diagnosis not present

## 2022-07-18 DIAGNOSIS — Z720 Tobacco use: Secondary | ICD-10-CM

## 2022-07-18 DIAGNOSIS — R739 Hyperglycemia, unspecified: Secondary | ICD-10-CM

## 2022-07-18 DIAGNOSIS — I10 Essential (primary) hypertension: Secondary | ICD-10-CM | POA: Diagnosis not present

## 2022-07-18 DIAGNOSIS — I5042 Chronic combined systolic (congestive) and diastolic (congestive) heart failure: Secondary | ICD-10-CM | POA: Diagnosis not present

## 2022-07-18 DIAGNOSIS — J449 Chronic obstructive pulmonary disease, unspecified: Secondary | ICD-10-CM

## 2022-07-18 DIAGNOSIS — I25110 Atherosclerotic heart disease of native coronary artery with unstable angina pectoris: Secondary | ICD-10-CM

## 2022-07-18 DIAGNOSIS — K219 Gastro-esophageal reflux disease without esophagitis: Secondary | ICD-10-CM | POA: Diagnosis not present

## 2022-07-18 DIAGNOSIS — F17210 Nicotine dependence, cigarettes, uncomplicated: Secondary | ICD-10-CM | POA: Diagnosis not present

## 2022-07-18 DIAGNOSIS — F331 Major depressive disorder, recurrent, moderate: Secondary | ICD-10-CM

## 2022-07-18 DIAGNOSIS — F1029 Alcohol dependence with unspecified alcohol-induced disorder: Secondary | ICD-10-CM

## 2022-07-18 DIAGNOSIS — Z1159 Encounter for screening for other viral diseases: Secondary | ICD-10-CM | POA: Diagnosis not present

## 2022-07-18 MED ORDER — PANTOPRAZOLE SODIUM 40 MG PO TBEC: 40.0000 mg | DELAYED_RELEASE_TABLET | Freq: Every day | ORAL | 5 refills | Status: AC

## 2022-07-18 MED ORDER — METOPROLOL SUCCINATE ER 100 MG PO TB24: 150.0000 mg | ORAL_TABLET | Freq: Every day | ORAL | 1 refills | Status: DC

## 2022-07-18 MED ORDER — ENTRESTO 49-51 MG PO TABS
0.5000 | ORAL_TABLET | Freq: Two times a day (BID) | ORAL | 3 refills | Status: DC
Start: 2022-07-18 — End: 2022-11-29

## 2022-07-18 MED ORDER — ATORVASTATIN CALCIUM 80 MG PO TABS: 80.0000 mg | ORAL_TABLET | Freq: Every day | ORAL | 3 refills | Status: DC

## 2022-07-18 MED ORDER — FUROSEMIDE 20 MG PO TABS: 20.0000 mg | ORAL_TABLET | Freq: Every day | ORAL | 5 refills | Status: DC

## 2022-07-18 MED ORDER — POTASSIUM CHLORIDE ER 10 MEQ PO TBCR: 10.0000 meq | EXTENDED_RELEASE_TABLET | Freq: Every day | ORAL | 5 refills | Status: DC

## 2022-07-18 MED ORDER — ALBUTEROL SULFATE (2.5 MG/3ML) 0.083% IN NEBU: 2.5000 mg | INHALATION_SOLUTION | RESPIRATORY_TRACT | 11 refills | Status: DC | PRN

## 2022-07-18 NOTE — Progress Notes (Unsigned)
Established Patient Office Visit  Subjective:  Patient ID: Timothy Walker, male    DOB: Jan 04, 1966  Age: 57 y.o. MRN: [id number]  CC:  Chief Complaint  Patient presents with   COPD    Follow up. Patient feels he is retaining fluid. His eating habits are bothering him.     HPI Timothy Walker is a 57 y.o. male with past medical history of CAD status post stent placement, HFrEF, HTN, COPD, GERD and tobacco abuse who presents for f/u of his chronic medical conditions.  CAD s/p stent placement, HFrEF and HTN:  His BP was elevated in the office today.  He also reports chronic, intermittent bilateral leg swelling and hand swelling.  He had been taking Entresto, amlodipine and metoprolol for history of CAD and HTN, but has run out of them due to lack of insurance. He denies any headache, dizziness or chest pain currently.  He has intermittent palpitations despite taking Metoprolol.  He used to take about 2 shots of bourbon almost every day. He was on aspirin and statin currently for CAD.  COPD: He uses albuterol inhaler and nebulizer as needed for dyspnea or wheezing.  He has Trelegy, but compliance is questionable.  He smokes about 0.5 pack/day.  GERD: He takes pantoprazole for it.  MDD: He reports difficulty, insomnia, chronic fatigue and trouble with concentration.  He has tried multiple antidepressants, and did not like the effects of them.  He prefers to see psychiatrist.  Denies any SI or HI currently.   Past Medical History:  Diagnosis Date   Cardiogenic shock 01/13/2013   COPD (chronic obstructive pulmonary disease)    Coronary artery disease    a. 12/2012 Inf STEMI/Cath/PCI: LM nl, LAD nl, D1 50ost, D2 min irregs, D3 small, LCX 80-54m, OM1/2/3 min irregs, RI 20p, RCA 50p/100d (3.5x18 Xience DEs),PDA/PL nl, EF 55% - complicated by VF/CGS/IABP/VDRF   GERD (gastroesophageal reflux disease)    Hiatal hernia    Hypertension    Ischemic cardiomyopathy    a. 12/2012 Echo: EF 45-50%,  basal inf, inflat, mid inf HK, mildly reduced RV fxn.   Marijuana abuse    MI (myocardial infarction) 2014   NSTEMI (non-ST elevated myocardial infarction) 10/07/2019   Obstructive sleep apnea    ST elevation myocardial infarction (STEMI) of inferior wall, initial episode of care 01/16/2013   Tobacco abuse    Ventricular fibrillation 01/17/2013    Past Surgical History:  Procedure Laterality Date   COLONOSCOPY WITH PROPOFOL N/A 05/14/2014   Dr. Jena Gauss: anal canal hemorrhoid, rectosigmoid hyerpplastic polyp, right-sided divetriculosis    CORONARY ANGIOPLASTY  01/13/2013   STENT TO RCA BY DR Excell Seltzer   CORONARY STENT INTERVENTION N/A 10/07/2019   Procedure: CORONARY STENT INTERVENTION;  Surgeon: Marykay Lex, MD;  Location: [facility] INVASIVE CV LAB;  Service: Cardiovascular;  Laterality: N/A;   CORONARY/GRAFT ACUTE MI REVASCULARIZATION N/A 10/07/2019   Procedure: Coronary/Graft Acute MI Revascularization;  Surgeon: Marykay Lex, MD;  Location: [facility] INVASIVE CV LAB;  Service: Cardiovascular;  Laterality: N/A;   DENTAL SURGERY     ESOPHAGEAL DILATION N/A 05/14/2014   Procedure: ESOPHAGEAL DILATION 57 [address]ONEY;  Surgeon: Corbin Ade, MD;  Location: [facility];  Service: Endoscopy;  Laterality: N/A;   ESOPHAGOGASTRODUODENOSCOPY (EGD) WITH PROPOFOL N/A 05/14/2014   Dr. Jena Gauss: empiric dilation, abnormal esophagus s/p biopsy (normal), small hiatal hernia   LEFT HEART CATH AND CORONARY ANGIOGRAPHY N/A 09/26/2018   Procedure: LEFT HEART CATH AND CORONARY ANGIOGRAPHY;  Surgeon:  Runell Gess, MD;  Location: [facility] INVASIVE CV [facility];  Service: Cardiovascular;  Laterality: N/A;   LEFT HEART CATH AND CORONARY ANGIOGRAPHY N/A 10/07/2019   Procedure: LEFT HEART CATH AND CORONARY ANGIOGRAPHY;  Surgeon: Marykay Lex, MD;  Location: [facility] INVASIVE CV LAB;  Service: Cardiovascular;  Laterality: N/A;   LEFT HEART CATHETERIZATION WITH CORONARY ANGIOGRAM N/A 01/13/2013   Procedure: LEFT HEART CATHETERIZATION WITH  CORONARY ANGIOGRAM;  Surgeon: Iran Ouch, MD;  Location: [facility] CATH LAB;  Service: Cardiovascular;  Laterality: N/A;   NASAL SEPTUM SURGERY     PERCUTANEOUS CORONARY STENT INTERVENTION (PCI-S)  01/13/2013   Procedure: PERCUTANEOUS CORONARY STENT INTERVENTION (PCI-S);  Surgeon: Iran Ouch, MD;  Location: [facility] CATH LAB;  Service: Cardiovascular;;   POLYPECTOMY  05/14/2014   Procedure: POLYPECTOMY;  Surgeon: Corbin Ade, MD;  Location: [facility];  Service: Endoscopy;;   RIGHT/LEFT HEART CATH AND CORONARY ANGIOGRAPHY N/A 03/16/2021   Procedure: RIGHT/LEFT HEART CATH AND CORONARY ANGIOGRAPHY;  Surgeon: Swazil[place], Peter M, MD;  Location: [facility] INVASIVE CV LAB;  Service: Cardiovascular;  Laterality: N/A;    Family History  Problem Relation Age of Onset   Heart attack Other    Hypertension Other    Colon cancer Neg Hx     Social History   Socioeconomic History   Marital status: Divorced    Spouse name: Not on file   Number of children: Not on file   Years of education: Not on file   Highest education level: Not on file  Occupational History   Not on file  Tobacco Use   Smoking status: Every Day    Packs/day: 0.75    Years: 31.00    Additional pack years: 0.00    Total pack years: 23.25    Types: Cigarettes    Start date: 11/21/1981   Smokeless tobacco: Never  Vaping Use   Vaping Use: Some days   Substances: Nicotine, CBD  Substance and Sexual Activity   Alcohol use: Not Currently    Comment: 2-4 shots of burbon daily, if off work may be a little more   Drug use: Yes    Types: Marijuana    Comment: daily   Sexual activity: Yes  Other Topics Concern   Not on file  Social History Narrative   Not on file   Social Determinants of Health   Financial Resource Strain: Not on file  Food Insecurity: Not on file  Transportation Needs: Not on file  Physical Activity: Not on file  Stress: Not on file  Social Connections: Not on file  Intimate Partner Violence: Not on file     Outpatient Medications Prior to Visit  Medication Sig Dispense Refill   albuterol (VENTOLIN HFA) 108 (90 Base) MCG/ACT inhaler Inhale 2 puffs into the lungs every 4 (four) hours as needed for wheezing or shortness of breath. 18 g 5   aspirin EC 81 MG EC tablet Take 1 tablet (81 mg total) by mouth daily with breakfast. Swallow whole. (Patient not taking: Reported on 08/11/2021) 30 tablet 11   Fluticasone-Umeclidin-Vilant (TRELEGY ELLIPTA) 100-62.5-25 MCG/ACT AEPB Inhale 1 puff into the lungs daily. 1 each 11   nicotine (NICODERM CQ - DOSED IN MG/24 HOURS) 21 mg/24hr patch Place 1 patch (21 mg total) onto the skin daily. (Patient not taking: Reported on 08/11/2021) 28 patch 0   nitroGLYCERIN (NITROSTAT) 0.4 MG SL tablet Place 1 tablet (0.4 mg total) under the tongue every 5 (five) minutes as needed for chest pain.  25 tablet 3   sildenafil (VIAGRA) 50 MG tablet Take 1 tablet (50 mg total) by mouth daily as needed for erectile dysfunction. 10 tablet 2   albuterol (PROVENTIL) (2.5 MG/3ML) 0.083% nebulizer solution Take 3 mLs (2.5 mg total) by nebulization every 2 (two) hours as needed for wheezing or shortness of breath. 75 mL 12   atorvastatin (LIPITOR) 80 MG tablet Take 1 tablet (80 mg total) by mouth daily. 90 tablet 5   furosemide (LASIX) 20 MG tablet Take 1 tablet (20 mg total) by mouth every other day. 30 tablet 11   metoprolol succinate (TOPROL-XL) 100 MG 24 hr tablet Take 1.5 tablets (150 mg total) by mouth daily. Take with or immediately following a meal. 135 tablet 3   metoprolol tartrate (LOPRESSOR) 50 MG tablet TAKE 1 TABLET (50MG )TWICE DAILY IN ADDITION TO 100MG  TABLET FOR TOTAL OF 150MG  TWICE DAILY. 60 tablet 0   pantoprazole (PROTONIX) 40 MG tablet TAKE 1 TABLET BY MOUTH TWICE A DAY 15 tablet 0   potassium chloride (KLOR-CON) 10 MEQ tablet Take 10 mEq by mouth every other day.     sacubitril-valsartan (ENTRESTO) 49-51 MG Take 0.5 tablets by mouth 2 (two) times daily. Please Honor Card  patient is presenting for Cecelia Byars: [id number]; Alvira Philips: [id number]; RXPCN: [id number]; RXID: [id number]     No facility-administered medications prior to visit.    No Known Allergies  ROS Review of Systems  Constitutional:  Negative for chills and fever.  HENT:  Negative for congestion and sore throat.   Eyes:  Negative for pain and discharge.  Respiratory:  Positive for shortness of breath and wheezing. Negative for cough.   Cardiovascular:  Positive for palpitations. Negative for chest pain.  Gastrointestinal:  Negative for diarrhea, nausea and vomiting.  Endocrine: Negative for polydipsia and polyuria.  Genitourinary:  Negative for dysuria and hematuria.  Musculoskeletal:  Negative for neck pain and neck stiffness.  Skin:  Negative for rash.  Neurological:  Negative for dizziness, weakness, numbness and headaches.  Psychiatric/Behavioral:  Negative for agitation and behavioral problems.       Objective:    Physical Exam Vitals reviewed.  Constitutional:      General: He is not in acute distress.    Appearance: He is not diaphoretic.  HENT:     Head: Normocephalic and atraumatic.     Nose: Nose normal.     Mouth/Throat:     Mouth: Mucous membranes are moist.  Eyes:     General: No scleral icterus.    Extraocular Movements: Extraocular movements intact.  Cardiovascular:     Rate and Rhythm: Normal rate and regular rhythm.     Heart sounds: Normal heart sounds. No murmur heard. Pulmonary:     Breath sounds: Normal breath sounds. No wheezing or rales.  Musculoskeletal:     Cervical back: Neck supple. No tenderness.     Right lower leg: Edema (Mild) present.     Left lower leg: Edema (Mild) present.  Skin:    General: Skin is warm.     Findings: No rash.  Neurological:     General: No focal deficit present.     Mental Status: He is alert and oriented to person, place, and time.  Psychiatric:        Mood and Affect: Mood normal.        Behavior: Behavior normal.      BP (!) 156/98 (BP Location: Right Arm)   Pulse 79   Ht 5\' 11"  (1.803 m)  Wt 194 lb 12.8 oz (88.4 kg)   SpO2 98%   BMI 27.17 kg/m  Wt Readings from Last 3 Encounters:  07/18/22 194 lb 12.8 oz (88.4 kg)  08/11/21 186 lb 6.4 oz (84.6 kg)  07/19/21 187 lb (84.8 kg)    No results found for: "TSH" Lab Results  Component Value Date   WBC 11.8 (H) 03/14/2021   HGB 15.3 03/16/2021   HGB 15.3 03/16/2021   HCT 45.0 03/16/2021   HCT 45.0 03/16/2021   MCV 102.7 (H) 03/14/2021   PLT 282 03/14/2021   Lab Results  Component Value Date   NA 138 03/16/2021   NA 138 03/16/2021   K 4.2 03/16/2021   K 4.2 03/16/2021   CO2 26 03/14/2021   GLUCOSE 106 (H) 03/14/2021   BUN 16 03/14/2021   CREATININE 0.89 03/14/2021   BILITOT 0.4 03/09/2021   ALKPHOS 63 03/09/2021   AST 28 03/09/2021   ALT 18 03/09/2021   PROT 7.8 03/09/2021   ALBUMIN 4.1 03/09/2021   CALCIUM 9.4 03/14/2021   ANIONGAP 10 03/14/2021   Lab Results  Component Value Date   CHOL 146 10/09/2019   Lab Results  Component Value Date   HDL 39 (L) 10/09/2019   Lab Results  Component Value Date   LDLCALC 74 10/09/2019   Lab Results  Component Value Date   TRIG 165 (H) 10/09/2019   Lab Results  Component Value Date   CHOLHDL 3.7 10/09/2019   No results found for: "HGBA1C"    Assessment & Plan:   Problem List Items Addressed This Visit       Cardiovascular and Mediastinum   Coronary artery disease involving native coronary artery of native heart with unstable angina pectoris (Chronic)    S/p stent placement On aspirin and statin, refilled On beta-blocker - refilled Toprol 150 mg, DC Lopressor (duplicate) Has nitroglycerin as needed for chest pain Followed by cardiology - needs to schedule appt      Relevant Medications   atorvastatin (LIPITOR) 80 MG tablet   furosemide (LASIX) 20 MG tablet   metoprolol succinate (TOPROL-XL) 100 MG 24 hr tablet   sacubitril-valsartan (ENTRESTO) 49-51 MG    Essential hypertension (Chronic)    BP Readings from Last 1 Encounters:  07/18/22 (!) 156/98  uncontrolled due to noncompliance Refilled Entresto 49-51 mg 1/2 tablet QD and metoprolol 150 mg twice daily Did not tolerate Entresto in the past, but agrees to try it for CHF -advised to discuss with cardiology Counseled for compliance with the medications Advised DASH diet and moderate exercise/walking, at least 150 mins/week       Relevant Medications   atorvastatin (LIPITOR) 80 MG tablet   furosemide (LASIX) 20 MG tablet   metoprolol succinate (TOPROL-XL) 100 MG 24 hr tablet   sacubitril-valsartan (ENTRESTO) 49-51 MG   Chronic combined systolic and diastolic heart failure - Primary    History of ischemic cardiomyopathy Has chronic leg swelling, Lasix 20 mg QD for now On metoprolol, refilled 150 mg QD Refilled Entresto, discussed about its potential benefits - he agrees to take it. Followed by cardiology      Relevant Medications   atorvastatin (LIPITOR) 80 MG tablet   furosemide (LASIX) 20 MG tablet   metoprolol succinate (TOPROL-XL) 100 MG 24 hr tablet   potassium chloride (KLOR-CON) 10 MEQ tablet   sacubitril-valsartan (ENTRESTO) 49-51 MG   Other Relevant Orders   CBC with Differential/Platelet   Basic Metabolic Panel (BMET)     Respiratory  COPD (chronic obstructive pulmonary disease)    Well-controlled with Trelegy as needed albuterol He also uses albuterol neb as needed for dyspnea or wheezing      Relevant Medications   albuterol (PROVENTIL) (2.5 MG/3ML) 0.083% nebulizer solution     Digestive   GERD (gastroesophageal reflux disease)    Well controlled with pantoprazole      Relevant Medications   pantoprazole (PROTONIX) 40 MG tablet     Other   Tobacco abuse (Chronic)    Smokes about 0.5 pack/day  Asked about quitting: confirms that he/she currently smokes cigarettes Advise to quit smoking: Educated about QUITTING to reduce the risk of cancer, cardio and  cerebrovascular disease. Assess willingness: Unwilling to quit at this time, but is working on cutting back. Assist with counseling and pharmacotherapy: Counseled for 5 minutes and literature provided. Arrange for follow up: follow up in 3 months and continue to offer help.      Hyperlipidemia with target LDL less than 70 (Chronic)    Refilled atorvastatin      Relevant Medications   atorvastatin (LIPITOR) 80 MG tablet   furosemide (LASIX) 20 MG tablet   metoprolol succinate (TOPROL-XL) 100 MG 24 hr tablet   sacubitril-valsartan (ENTRESTO) 49-51 MG   Other Relevant Orders   Lipid Profile   Alcohol dependence    Needs to cut down alcohol use as it is worsening his CHF and MDD      Moderate episode of recurrent major depressive disorder    Uncontrolled Has tried SSRIs in the past, but did not like the effects Referred to psychiatry as per patient request      Relevant Orders   Ambulatory referral to Psychiatry   CBC with Differential/Platelet   TSH   Other Visit Diagnoses     Hyperglycemia       Relevant Orders   Hemoglobin A1c   Need for hepatitis C screening test       Relevant Orders   Hepatitis C Antibody       Meds ordered this encounter  Medications   atorvastatin (LIPITOR) 80 MG tablet    Sig: Take 1 tablet (80 mg total) by mouth daily.    Dispense:  90 tablet    Refill:  3   albuterol (PROVENTIL) (2.5 MG/3ML) 0.083% nebulizer solution    Sig: Take 3 mLs (2.5 mg total) by nebulization every 2 (two) hours as needed for wheezing or shortness of breath.    Dispense:  75 mL    Refill:  11   furosemide (LASIX) 20 MG tablet    Sig: Take 1 tablet (20 mg total) by mouth daily.    Dispense:  30 tablet    Refill:  5   metoprolol succinate (TOPROL-XL) 100 MG 24 hr tablet    Sig: Take 1.5 tablets (150 mg total) by mouth daily. Take with 50 mg for a total of 150 mg. Take with or immediately following a meal.    Dispense:  135 tablet    Refill:  1   pantoprazole  (PROTONIX) 40 MG tablet    Sig: Take 1 tablet (40 mg total) by mouth daily.    Dispense:  30 tablet    Refill:  5    Please schedule an appointment for further refills.   potassium chloride (KLOR-CON) 10 MEQ tablet    Sig: Take 1 tablet (10 mEq total) by mouth daily. With Lasix.    Dispense:  30 tablet    Refill:  5  sacubitril-valsartan (ENTRESTO) 49-51 MG    Sig: Take 0.5 tablets by mouth 2 (two) times daily.    Dispense:  60 tablet    Refill:  3    Dose decreased 07/19/2021    Follow-up: Return in about 3 weeks (around 08/08/2022) for HTN and CHF.    Anabel Halon, MD

## 2022-07-18 NOTE — Patient Instructions (Signed)
Please start taking Metoprolol 150 mg once daily.  Please start taking other medications as prescribed.  Please continue to follow low salt diet and perform moderate exercise/walking at least 150 mins/week.

## 2022-07-19 ENCOUNTER — Encounter: Payer: Self-pay | Admitting: Internal Medicine

## 2022-07-19 ENCOUNTER — Encounter: Payer: Self-pay | Admitting: Cardiology

## 2022-07-19 ENCOUNTER — Ambulatory Visit: Payer: Medicaid Other | Attending: Cardiology | Admitting: Cardiology

## 2022-07-19 VITALS — BP 158/110 | HR 72 | Ht 71.0 in | Wt 196.0 lb

## 2022-07-19 DIAGNOSIS — I5022 Chronic systolic (congestive) heart failure: Secondary | ICD-10-CM | POA: Diagnosis not present

## 2022-07-19 DIAGNOSIS — I5042 Chronic combined systolic (congestive) and diastolic (congestive) heart failure: Secondary | ICD-10-CM

## 2022-07-19 DIAGNOSIS — I2510 Atherosclerotic heart disease of native coronary artery without angina pectoris: Secondary | ICD-10-CM | POA: Diagnosis not present

## 2022-07-19 DIAGNOSIS — I493 Ventricular premature depolarization: Secondary | ICD-10-CM | POA: Diagnosis not present

## 2022-07-19 MED ORDER — METOPROLOL SUCCINATE ER 50 MG PO TB24: 50.0000 mg | ORAL_TABLET | Freq: Every day | ORAL | 2 refills | Status: DC

## 2022-07-19 MED ORDER — METOPROLOL SUCCINATE ER 100 MG PO TB24: 100.0000 mg | ORAL_TABLET | Freq: Every day | ORAL | 2 refills | Status: DC

## 2022-07-19 NOTE — Assessment & Plan Note (Addendum)
S/p stent placement On aspirin and statin, refilled On beta-blocker - refilled Toprol 150 mg, DC Lopressor (duplicate) Has nitroglycerin as needed for chest pain Followed by cardiology - needs to schedule appt

## 2022-07-19 NOTE — Patient Instructions (Addendum)

## 2022-07-19 NOTE — Assessment & Plan Note (Signed)
BP Readings from Last 1 Encounters:  07/18/22 (!) 156/98   uncontrolled due to noncompliance Refilled Entresto 49-51 mg 1/2 tablet QD and metoprolol 150 mg twice daily Did not tolerate Entresto in the past, but agrees to try it for CHF -advised to discuss with cardiology Counseled for compliance with the medications Advised DASH diet and moderate exercise/walking, at least 150 mins/week

## 2022-07-19 NOTE — Assessment & Plan Note (Signed)
Well-controlled with pantoprazole ?

## 2022-07-19 NOTE — Assessment & Plan Note (Signed)
Refilled atorvastatin

## 2022-07-19 NOTE — Assessment & Plan Note (Signed)
Uncontrolled Has tried SSRIs in the past, but did not like the effects Referred to psychiatry as per patient request

## 2022-07-19 NOTE — Assessment & Plan Note (Signed)
Smokes about 0.5 pack/day ? ?Asked about quitting: confirms that he/she currently smokes cigarettes ?Advise to quit smoking: Educated about QUITTING to reduce the risk of cancer, cardio and cerebrovascular disease. ?Assess willingness: Unwilling to quit at this time, but is working on cutting back. ?Assist with counseling and pharmacotherapy: Counseled for 5 minutes and literature provided. ?Arrange for follow up: follow up in 3 months and continue to offer help. ?

## 2022-07-19 NOTE — Assessment & Plan Note (Signed)
Needs to cut down alcohol use as it is worsening his CHF and MDD

## 2022-07-19 NOTE — Assessment & Plan Note (Signed)
Well-controlled with Trelegy as needed albuterol He also uses albuterol neb as needed for dyspnea or wheezing

## 2022-07-19 NOTE — Assessment & Plan Note (Signed)
History of ischemic cardiomyopathy Has chronic leg swelling, Lasix 20 mg QD for now On metoprolol, refilled 150 mg QD Refilled Entresto, discussed about its potential benefits - he agrees to take it. Followed by cardiology

## 2022-07-19 NOTE — Progress Notes (Signed)
Clinical Summary Timothy Walker is a 57 y.o.male seen today for follow up of the following medical problems.      1. CAD   - admit to [facility] 01/13/13 to 01/17/13 with STEMI, DES to RCA.   - patient presented in cardiogenic shock, suffered VT/VF arrest requiring multiple defibrillations and IV amiodarone. Following PCI required balloon pump and vasopressors and temporary transvenous pacing.   - Echo LVEF 45-50%, inferior hypokinesis, normal diastolic function        09/2018 cath mid LAD 30%, ramus 30%, LCX mid 90% small nondom and stable, RCA 30% prox. 09/2018 echo LVEF 45-50%, grade I diastolic dysfunction, basal mid inferior akinesis.      09/2019 admitted with NSTEMI - had DES to LCX, during procedure VT requiring defib - Echo showed LVEF of 40-45%     02/2021 cath: nonobstructive CAD, patent stents - no recent chest pains - poor compliance with meds.      2. Chronic systolic HF 09/2019 echo: 40-45% 02/2021 echo LVEF 30-35%  02/2021 cath: nonobstructive CAD, patent stents   - entresto caused dizziness, last visit we tried lowering dose to 24/26mg  bid     -noncompliant with meds      3. HTN   -not compliant with meds   4. Hyperlipidemia   - not compliant with statin   5. COPD - abnormal PFTs in 2015 - mixed compliance with inhalers.  - pcp started trelegy just today   6. PVCs - long history of PVCs - frequent PVCs noted during recent admission, beta blocker titrated up - ongoing symptoms of palpitations, heart fluttering with some chest discomfort   - 05/2021 monitor frequent PVCs (5.7% burden), 9 runs of NSVT lognest 6 beats - ongoing symptoms, daily.  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 12/2012 Inf STEMI/Cath/PCI: LM nl, LAD nl, D1 50ost, D2 min irregs, D3 small, LCX 80-12m, OM1/2/3 min irregs, RI 20p, RCA 50p/100d (3.5x18 Xience DEs),PDA/PL nl, EF 55% - complicated by VF/CGS/IABP/VDRF   GERD (gastroesophageal reflux disease)    Hiatal hernia    Hypertension    Ischemic cardiomyopathy    a. 12/2012 Echo: EF 45-50%, basal inf, inflat, mid inf HK, mildly reduced RV fxn.   Marijuana abuse    MI (myocardial infarction) 2014   NSTEMI (non-ST elevated myocardial infarction) 10/07/2019   Obstructive sleep apnea    ST elevation myocardial infarction (STEMI) of inferior wall, initial episode of care 01/16/2013   Tobacco abuse    Ventricular fibrillation 01/17/2013     No Known Allergies   Current Outpatient Medications  Medication Sig Dispense Refill   albuterol (PROVENTIL) (2.5 MG/3ML) 0.083% nebulizer solution Take 3 mLs (2.5 mg total) by nebulization every 2 (two) hours as needed for wheezing or shortness of breath. 75 mL 11   albuterol (VENTOLIN HFA) 108 (90 Base) MCG/ACT inhaler Inhale 2 puffs into the lungs every 4 (four) hours as needed for wheezing or shortness of breath. 18 g 5   aspirin EC 81 MG EC tablet Take 1 tablet (81 mg total) by mouth daily with breakfast. Swallow whole. (Patient not taking: Reported on 08/11/2021) 30 tablet 11   atorvastatin (LIPITOR) 80  MG tablet Take 1 tablet (80 mg total) by mouth daily. 90 tablet 3   Fluticasone-Umeclidin-Vilant (TRELEGY ELLIPTA) 100-62.5-25 MCG/ACT AEPB Inhale 1 puff into the lungs daily. 1 each 11   furosemide (LASIX) 20 MG tablet Take 1 tablet (20 mg total) by mouth daily. 30 tablet 5   metoprolol succinate (TOPROL-XL) 100 MG 24 hr tablet Take 1.5 tablets (150 mg total) by mouth daily. Take with 50 mg for a total of 150 mg. Take with or immediately following a meal. 135 tablet 1   nicotine (NICODERM CQ - DOSED IN MG/24 HOURS) 21 mg/24hr patch Place 1 patch (21 mg total) onto the skin daily. (Patient  not taking: Reported on 08/11/2021) 28 patch 0   nitroGLYCERIN (NITROSTAT) 0.4 MG SL tablet Place 1 tablet (0.4 mg total) under the tongue every 5 (five) minutes as needed for chest pain. 25 tablet 3   pantoprazole (PROTONIX) 40 MG tablet Take 1 tablet (40 mg total) by mouth daily. 30 tablet 5   potassium chloride (KLOR-CON) 10 MEQ tablet Take 1 tablet (10 mEq total) by mouth daily. With Lasix. 30 tablet 5   sacubitril-valsartan (ENTRESTO) 49-51 MG Take 0.5 tablets by mouth 2 (two) times daily. 60 tablet 3   sildenafil (VIAGRA) 50 MG tablet Take 1 tablet (50 mg total) by mouth daily as needed for erectile dysfunction. 10 tablet 2   No current facility-administered medications for this visit.     Past Surgical History:  Procedure Laterality Date   COLONOSCOPY WITH PROPOFOL N/A 05/14/2014   Dr. Jena Gauss: anal canal hemorrhoid, rectosigmoid hyerpplastic polyp, right-sided divetriculosis    CORONARY ANGIOPLASTY  01/13/2013   STENT TO RCA BY DR Excell Seltzer   CORONARY STENT INTERVENTION N/A 10/07/2019   Procedure: CORONARY STENT INTERVENTION;  Surgeon: Marykay Lex, MD;  Location: [facility] INVASIVE CV LAB;  Service: Cardiovascular;  Laterality: N/A;   CORONARY/GRAFT ACUTE MI REVASCULARIZATION N/A 10/07/2019   Procedure: Coronary/Graft Acute MI Revascularization;  Surgeon: Marykay Lex, MD;  Location: [facility] INVASIVE CV LAB;  Service: Cardiovascular;  Laterality: N/A;   DENTAL SURGERY     ESOPHAGEAL DILATION N/A 05/14/2014   Procedure: ESOPHAGEAL DILATION [address] FRENCH MALONEY;  Surgeon: Corbin Ade, MD;  Location: [facility] ORS;  Service: Endoscopy;  Laterality: N/A;   ESOPHAGOGASTRODUODENOSCOPY (EGD) WITH PROPOFOL N/A 05/14/2014   Dr. Jena Gauss: empiric dilation, abnormal esophagus s/p biopsy (normal), small hiatal hernia   LEFT HEART CATH AND CORONARY ANGIOGRAPHY N/A 09/26/2018   Procedure: LEFT HEART CATH AND CORONARY ANGIOGRAPHY;  Surgeon: Runell Gess, MD;  Location: [facility] INVASIVE CV LAB;  Service: Cardiovascular;   Laterality: N/A;   LEFT HEART CATH AND CORONARY ANGIOGRAPHY N/A 10/07/2019   Procedure: LEFT HEART CATH AND CORONARY ANGIOGRAPHY;  Surgeon: Marykay Lex, MD;  Location: [facility] INVASIVE CV LAB;  Service: Cardiovascular;  Laterality: N/A;   LEFT HEART CATHETERIZATION WITH CORONARY ANGIOGRAM N/A 01/13/2013   Procedure: LEFT HEART CATHETERIZATION WITH CORONARY ANGIOGRAM;  Surgeon: Iran Ouch, MD;  Location: [facility] CATH LAB;  Service: Cardiovascular;  Laterality: N/A;   NASAL SEPTUM SURGERY     PERCUTANEOUS CORONARY STENT INTERVENTION (PCI-S)  01/13/2013   Procedure: PERCUTANEOUS CORONARY STENT INTERVENTION (PCI-S);  Surgeon: Iran Ouch, MD;  Location: [facility] CATH LAB;  Service: Cardiovascular;;   POLYPECTOMY  05/14/2014   Procedure: POLYPECTOMY;  Surgeon: Corbin Ade, MD;  Location: [facility] ORS;  Service: Endoscopy;;   RIGHT/LEFT HEART CATH AND CORONARY ANGIOGRAPHY N/A 03/16/2021   Procedure: RIGHT/LEFT HEART CATH AND CORONARY  ANGIOGRAPHY;  Surgeon: Swaziland, Peter M, MD;  Location: [facility] INVASIVE CV LAB;  Service: Cardiovascular;  Laterality: N/A;     No Known Allergies    Family History  Problem Relation Age of Onset   Heart attack Other    Hypertension Other    Colon cancer Neg Hx      Social History Mr. Carton reports that he has been smoking cigarettes. He started smoking about 40 years ago. He has a 23.25 pack-year smoking history. He has never used smokeless tobacco. Mr. Guyton reports that he does not currently use alcohol.   Review of Systems CONSTITUTIONAL: No weight loss, fever, chills, weakness or fatigue.  HEENT: Eyes: No visual loss, blurred vision, double vision or yellow sclerae.No hearing loss, sneezing, congestion, runny nose or sore throat.  SKIN: No rash or itching.  CARDIOVASCULAR: per hpi RESPIRATORY: No shortness of breath, cough or sputum.  GASTROINTESTINAL: No anorexia, nausea, vomiting or diarrhea. No abdominal pain or blood.  GENITOURINARY: No burning on  urination, no polyuria NEUROLOGICAL: No headache, dizziness, syncope, paralysis, ataxia, numbness or tingling in the extremities. No change in bowel or bladder control.  MUSCULOSKELETAL: No muscle, back pain, joint pain or stiffness.  LYMPHATICS: No enlarged nodes. No history of splenectomy.  PSYCHIATRIC: No history of depression or anxiety.  ENDOCRINOLOGIC: No reports of sweating, cold or heat intolerance. No polyuria or polydipsia.  Marland Kitchen   Physical Examination Today's Vitals   07/19/22 1007 07/19/22 1017  BP: (!) 160/110 (!) 158/110  Pulse: 72   SpO2: 98%   Weight: 196 lb (88.9 kg)   Height:  (1.803 m)    Body mass index is 27.34 kg/m.  Gen: resting comfortably, no acute distress HEENT: no scleral icterus, pupils equal round and reactive, no palptable cervical adenopathy,  CV: RRR, no mrg, no jvd Resp: Clear to auscultation bilaterally GI: abdomen is soft, non-tender, non-distended, normal bowel sounds, no hepatosplenomegaly MSK: extremities are warm, no edema.  Skin: warm, no rash Neuro:  no focal deficits Psych: appropriate affect   Diagnostic Studies  09/2018 echo IMPRESSIONS      1. The left ventricle has mildly reduced systolic function, with an ejection fraction of 45-50%. The cavity size was mildly dilated. There is mildly increased left ventricular wall thickness. Left ventricular diastolic Doppler parameters are consistent with impaired relaxation.  2. There is akinesis of the left ventricular, basal-mid inferior wall.  3. The right ventricle has normal systolic function. The cavity was normal. There is no increase in right ventricular wall thickness. Right ventricular systolic pressure is mildly elevated with an estimated pressure of 39.8 mmHg.  4. The mitral valve is grossly normal. There is mild mitral regurgitation.  5. The tricuspid valve is grossly normal.  6. The aortic valve is tricuspid.  7. The aortic root is normal in size and structure.  8. The  inferior vena cava was dilated in size with >50% respiratory variability.   09/2018 cath Previously placed Mid RCA to Dist RCA stent (unknown type) is widely patent. Mid Cx lesion is 90% stenosed. Mid LAD lesion is 30% stenosed. Prox RCA lesion is 30% stenosed. Ramus lesion is 30% stenosed. There is moderate left ventricular systolic dysfunction. LV end diastolic pressure is mildly elevated. The left ventricular ejection fraction is 35-45% by visual estimate.   VINAYAK BOBIER is a 57 y.o. male IMPRESSION: Mr. Coppa distal dominant RCA stent was widely patent.  He had high-grade lesion in a nondominant circumflex similar as described at  the cath in 2014.  He had moderate LV dysfunction with an EF in the 40% range and severe inferior hypokinesia.  I do not see a culprit vessel new since his previous cath.  He does have moderate LV dysfunction and medical therapy will will be recommended for this.  The sheath was removed and a TR band was placed on the right wrist to achieve patent hemostasis.  The patient left the lab in stable condition.  He can be discharged home later today with TOC 7 followed by return office visit with Dr. Wyline Mood in 3 to 4 weeks.   09/2019 cath Left Heart Catheterization 10/07/2019: There is mild left ventricular systolic dysfunction. The left ventricular ejection fraction is 45-50% by visual LV end diastolic pressure is normal. Coronary anatomy: Prox RCA lesion is 30% stenosed. Previously placed Mid RCA to Dist RCA stent (unknown type) is widely patent. Ramus lesion is 30% stenosed. Prox Cx to Dist Cx lesion is 100% stenosed with 99% stenosed side Lougenia Morrissey in 1st Mrg. LPAV lesion is 80% stenosed. A drug-eluting stent was successfully placed crossing from the small 1st Mrg into the LCx-2nd Mrg, using a SYNERGY XD 2.50X20. Post intervention, there is a 0% residual stenosis. Post intervention, the side Camran Keady was reduced to 99% residual stenosis. A drug-eluting stent was  successfully placed using a SYNERGY XD 2.50X20.   Summary: Two-Vessel Disease: With widely patent distal RCA stent and Toprol lesion being 100% proximal to mid LCx occlusion proximal to very small caliber 1st Mrg & AVG LCx. Difficult lesion to cross, but successful PTCA and DES PCI of the acutely occluded LCx using Synergy DES 2.5 mm x 20 mm-postdilated to 3.1-2.9 mm Mild proximal RCA and LAD disease. Mildly reduced EF of 45% with lateral hypokinesis. Sustained ventricular tachycardia related to angiography--s/p 1 shock DCCV restoring sinus rhythm    Recommendations: Restart postop medications but will reduce beta-blocker in half starting tonight. ->  Will need to determine appropriateness of home doses, but will dose other medications as of tomorrow. Continue home statin Cardiac rehab recommendation/consultation team to see the patient from hospital.   Echocardiogram 10/08/2019: Impressions:  1. Left ventricular ejection fraction, by estimation, is 40 to 45%. The  left ventricle has mildly decreased function. The left ventricle has no  regional wall motion abnormalities. There is moderate asymmetric left  ventricular hypertrophy of the  anteroseptum segment. Left ventricular diastolic parameters are consistent  with Grade I diastolic dysfunction (impaired relaxation). Diffuse mild  hypokinesis of the basal to mid segments, slightly worse in the  inferolateral myocardium. There is relative  sparing of the apical segments.   2. Right ventricular systolic function is normal. The right ventricular  size is normal.   3. Right atrial size was moderately dilated.   4. The mitral valve is normal in structure. Mild mitral valve  regurgitation. No evidence of mitral stenosis.   5. The aortic valve is tricuspid. Aortic valve regurgitation is not  visualized. No aortic stenosis is present.   6. Aortic dilatation noted. There is mild dilatation of the ascending  aorta measuring 37 mm.   7. The  inferior vena cava is normal in size with greater than 50%  respiratory variability, suggesting right atrial pressure of 3 mmHg.     02/2021 echo IMPRESSIONS     1. Left ventricular ejection fraction, by estimation, is 30 to 35%. The  left ventricle has moderately decreased function. The left ventricle  demonstrates regional wall motion abnormalities (see scoring  diagram/findings for description). The basal-to-mid   inferior wall is akinetic. The rest of the LV segments are hypokinetic.  The left ventricular internal cavity size was moderately dilated. There is  mild concentric left ventricular hypertrophy. Left ventricular diastolic  parameters are consistent with  Grade II diastolic dysfunction (pseudonormalization).   2. Right ventricular systolic function is normal. The right ventricular  size is normal. Tricuspid regurgitation signal is inadequate for assessing  PA pressure.   3. Left atrial size was severely dilated.   4. The mitral valve is normal in structure. Mild mitral valve  regurgitation.   5. The aortic valve is tricuspid. There is mild calcification of the  aortic valve. There is mild thickening of the aortic valve. Aortic valve  regurgitation is not visualized. Aortic valve sclerosis/calcification is  present, without any evidence of  aortic stenosis.   6. Aortic dilatation noted. There is mild dilatation of the aortic root,  measuring 38 mm.   7. The inferior vena cava is normal in size with greater than 50%  respiratory variability, suggesting right atrial pressure of 3 mmHg.   02/2021 RHC/LHC   Prox RCA lesion is 30% stenosed.   Ramus lesion is 30% stenosed.   LPAV lesion is 80% stenosed.   Non-stenotic Prox Cx to Dist Cx lesion with 99% stenosed side Emelie Newsom in 1st Mrg was previously treated.   Non-stenotic Mid RCA to Dist RCA lesion was previously treated.   LV end diastolic pressure is normal.   Nonobstructive CAD. Prior stents in the RCA and LCx are  patent.  Low LV filling pressures. PCWP 4 mm Hg, LVEDP 3 mm Hg Normal right heart pressures. PAP mean 11 mm Hg Normal cardiac output with index 2.76    Plan: will reduce lasix to every other day until seen back in office. Continue medical management.      05/2021 monitor 14 day monitor Rare supraventricular ectopy in the form of isolated PACs Frequent ventricular in the form of isolated PVCs (5.7%). Rare couplets and triplets. 9 runs of NSVT longest 6 beats Several patient triggered events however the presence of symptoms was not documented. Triggered events were correlated with sinus rhythm and PVCs and PACs     Patch Wear Time:  14 days and 0 hours (2023-01-31T13:41:31-0500 to 2023-02-14T13:41:35-0500)   Patient had a min HR of 50 bpm, max HR of 266 bpm, and avg HR of 83 bpm. Predominant underlying rhythm was Sinus Rhythm. 9 Ventricular Tachycardia runs occurred, the run with the fastest interval lasting 6 beats with a max rate of 266 bpm, the longest  lasting 4 beats with an avg rate of 127 bpm. Ventricular Tachycardia was detected within +/- 45 seconds of symptomatic patient event(s). Isolated SVEs were rare (<1.0%), and no SVE Couplets or SVE Triplets were present. Isolated VEs were frequent (5.7%,  [id number]), VE Couplets were rare (<1.0%, 4461), and VE Triplets were rare (<1.0%, 756). Ventricular Bigeminy and Trigeminy were present.      Assessment and Plan   1. CAD - no recent symptoms - encouraged compliance with emdications.    2. Chronic systolic HF - chronically poorly compliant with meds, not currently taking his regimen as prescribed.  - side effects on mid dose entresto, will see if can tolerate lower dose 24/26mg  bid - encouragd compliance, if demonstrates can consider further titration. We have not repeated echo or considred ICD as he is not taking his medications despite several conversations   3. Palpitations/PVCs -no recent symptoms, continue  toprol       F/u 6  months   Antoine Poche, M.D., F.A.C.C.

## 2022-08-08 ENCOUNTER — Ambulatory Visit (INDEPENDENT_AMBULATORY_CARE_PROVIDER_SITE_OTHER): Payer: Medicaid Other | Admitting: Internal Medicine

## 2022-08-08 ENCOUNTER — Encounter: Payer: Self-pay | Admitting: Internal Medicine

## 2022-08-08 VITALS — BP 144/88 | HR 80 | Ht 71.0 in | Wt 192.4 lb

## 2022-08-08 DIAGNOSIS — F331 Major depressive disorder, recurrent, moderate: Secondary | ICD-10-CM

## 2022-08-08 DIAGNOSIS — I5042 Chronic combined systolic (congestive) and diastolic (congestive) heart failure: Secondary | ICD-10-CM

## 2022-08-08 DIAGNOSIS — J449 Chronic obstructive pulmonary disease, unspecified: Secondary | ICD-10-CM | POA: Diagnosis not present

## 2022-08-08 DIAGNOSIS — I10 Essential (primary) hypertension: Secondary | ICD-10-CM | POA: Diagnosis not present

## 2022-08-08 MED ORDER — TRELEGY ELLIPTA 100-62.5-25 MCG/ACT IN AEPB
1.0000 | INHALATION_SPRAY | Freq: Every day | RESPIRATORY_TRACT | 11 refills | Status: DC
Start: 2022-08-08 — End: 2023-08-21

## 2022-08-08 MED ORDER — ALBUTEROL SULFATE HFA 108 (90 BASE) MCG/ACT IN AERS
2.0000 | INHALATION_SPRAY | RESPIRATORY_TRACT | 5 refills | Status: DC | PRN
Start: 2022-08-08 — End: 2022-09-13

## 2022-08-08 MED ORDER — PROMETHAZINE-DM 6.25-15 MG/5ML PO SYRP
5.0000 mL | ORAL_SOLUTION | Freq: Four times a day (QID) | ORAL | 0 refills | Status: AC | PRN
Start: 2022-08-08 — End: ?

## 2022-08-08 NOTE — Assessment & Plan Note (Signed)
History of ischemic cardiomyopathy Has chronic leg swelling, Lasix 20 mg QD for now On metoprolol, refilled 150 mg QD Refilled Entresto, discussed about its potential benefits - he agrees to take it. Followed by cardiology 

## 2022-08-08 NOTE — Progress Notes (Signed)
Established Patient Office Visit  Subjective:  Patient ID: Timothy Walker, male    DOB: February 09, 1966  Age: 57 y.o. MRN: [id number]  CC:  Chief Complaint  Patient presents with   Hypertension    Three week follow up     HPI Timothy Walker is a 57 y.o. male with past medical history of CAD status post stent placement, HFrEF, HTN, COPD, GERD and tobacco abuse who presents for f/u of his chronic medical conditions.  CAD s/p stent placement, HFrEF and HTN:  His BP was elevated in the office today, but better compared to prior.  He reports improvement in chronic, intermittent bilateral leg swelling and hand swelling since starting Lasix.  He had been taking Entresto, amlodipine and metoprolol for history of CAD and HTN, but has not started taking Entresto yet. He denies any headache, dizziness or chest pain currently.  He has intermittent palpitations despite taking Metoprolol.  He takes about 2 shots of bourbon every weekend. He was on aspirin and statin currently for CAD.  COPD: He uses albuterol inhaler and nebulizer as needed for dyspnea or wheezing.  He has Trelegy, but compliance is questionable.  He smokes about 0.5 pack/day.  GERD: He takes pantoprazole for it.  MDD: He reports difficulty, insomnia, chronic fatigue and trouble with concentration.  He has tried multiple antidepressants, and did not like the effects of them.  He prefers to see psychiatrist.  Denies any SI or HI currently.   Past Medical History:  Diagnosis Date   Cardiogenic shock (HCC) 01/13/2013   COPD (chronic obstructive pulmonary disease) (HCC)    Coronary artery disease    a. 12/2012 Inf STEMI/Cath/PCI: LM nl, LAD nl, D1 50ost, D2 min irregs, D3 small, LCX 80-10m, OM1/2/3 min irregs, RI 20p, RCA 50p/100d (3.5x18 Xience DEs),PDA/PL nl, EF 55% - complicated by VF/CGS/IABP/VDRF   GERD (gastroesophageal reflux disease)    Hiatal hernia    Hypertension    Ischemic cardiomyopathy    a. 12/2012 Echo: EF 45-50%, basal  inf, inflat, mid inf HK, mildly reduced RV fxn.   Marijuana abuse    MI (myocardial infarction) (HCC) 2014   NSTEMI (non-ST elevated myocardial infarction) (HCC) 10/07/2019   Obstructive sleep apnea    ST elevation myocardial infarction (STEMI) of inferior wall, initial episode of care (HCC) 01/16/2013   Tobacco abuse    Ventricular fibrillation (HCC) 01/17/2013    Past Surgical History:  Procedure Laterality Date   COLONOSCOPY WITH PROPOFOL N/A 05/14/2014   Dr. Jena Gauss: anal canal hemorrhoid, rectosigmoid hyerpplastic polyp, right-sided divetriculosis    CORONARY ANGIOPLASTY  01/13/2013   STENT TO RCA BY DR Excell Seltzer   CORONARY STENT INTERVENTION N/A 10/07/2019   Procedure: CORONARY STENT INTERVENTION;  Surgeon: Marykay Lex, MD;  Location: [facility] INVASIVE CV LAB;  Service: Cardiovascular;  Laterality: N/A;   CORONARY/GRAFT ACUTE MI REVASCULARIZATION N/A 10/07/2019   Procedure: Coronary/Graft Acute MI Revascularization;  Surgeon: Marykay Lex, MD;  Location: [facility] INVASIVE CV LAB;  Service: Cardiovascular;  Laterality: N/A;   DENTAL SURGERY     ESOPHAGEAL DILATION N/A 05/14/2014   Procedure: ESOPHAGEAL DILATION 57 [address]Y;  Surgeon: Corbin Ade, MD;  Location: [facility];  Service: Endoscopy;  Laterality: N/A;   ESOPHAGOGASTRODUODENOSCOPY (EGD) WITH PROPOFOL N/A 05/14/2014   Dr. Jena Gauss: empiric dilation, abnormal esophagus s/p biopsy (normal), small hiatal hernia   LEFT HEART CATH AND CORONARY ANGIOGRAPHY N/A 09/26/2018   Procedure: LEFT HEART CATH AND CORONARY ANGIOGRAPHY;  Surgeon: Nanetta Batty  J, MD;  Location: [facility] INVASIVE CV LAB;  Service: Cardiovascular;  Laterality: N/A;   LEFT HEART CATH AND CORONARY ANGIOGRAPHY N/A 10/07/2019   Procedure: LEFT HEART CATH AND CORONARY ANGIOGRAPHY;  Surgeon: Marykay Lex, MD;  Location: [facility] INVASIVE CV LAB;  Service: Cardiovascular;  Laterality: N/A;   LEFT HEART CATHETERIZATION WITH CORONARY ANGIOGRAM N/A 01/13/2013   Procedure: LEFT HEART  CATHETERIZATION WITH CORONARY ANGIOGRAM;  Surgeon: Iran Ouch, MD;  Location: [facility] CATH LAB;  Service: Cardiovascular;  Laterality: N/A;   NASAL SEPTUM SURGERY     PERCUTANEOUS CORONARY STENT INTERVENTION (PCI-S)  01/13/2013   Procedure: PERCUTANEOUS CORONARY STENT INTERVENTION (PCI-S);  Surgeon: Iran Ouch, MD;  Location: [facility] CATH LAB;  Service: Cardiovascular;;   POLYPECTOMY  05/14/2014   Procedure: POLYPECTOMY;  Surgeon: Corbin Ade, MD;  Location: [facility];  Service: Endoscopy;;   RIGHT/LEFT HEART CATH AND CORONARY ANGIOGRAPHY N/A 03/16/2021   Procedure: RIGHT/LEFT HEART CATH AND CORONARY ANGIOGRAPHY;  Surgeon: [place]az[place], Peter M, MD;  Location: [facility] INVASIVE CV LAB;  Service: Cardiovascular;  Laterality: N/A;    Family History  Problem Relation Age of Onset   Heart attack Other    Hypertension Other    Colon cancer Neg Hx     Social History   Socioeconomic History   Marital status: Divorced    Spouse name: Not on file   Number of children: Not on file   Years of education: Not on file   Highest education level: Not on file  Occupational History   Not on file  Tobacco Use   Smoking status: Every Day    Packs/day: 0.75    Years: 31.00    Additional pack years: 0.00    Total pack years: 23.25    Types: Cigarettes    Start date: 11/21/1981   Smokeless tobacco: Never  Vaping Use   Vaping Use: Some days   Substances: Nicotine, CBD  Substance and Sexual Activity   Alcohol use: Not Currently    Comment: 2-4 shots of burbon daily, if off work may be a little more   Drug use: Yes    Types: Marijuana    Comment: daily   Sexual activity: Yes  Other Topics Concern   Not on file  Social History Narrative   Not on file   Social Determinants of Health   Financial Resource Strain: Not on file  Food Insecurity: Not on file  Transportation Needs: Not on file  Physical Activity: Not on file  Stress: Not on file  Social Connections: Not on file  Intimate Partner  Violence: Not on file    Outpatient Medications Prior to Visit  Medication Sig Dispense Refill   albuterol (PROVENTIL) (2.5 MG/3ML) 0.083% nebulizer solution Take 3 mLs (2.5 mg total) by nebulization every 2 (two) hours as needed for wheezing or shortness of breath. 75 mL 11   aspirin EC 81 MG EC tablet Take 1 tablet (81 mg total) by mouth daily with breakfast. Swallow whole. 30 tablet 11   atorvastatin (LIPITOR) 80 MG tablet Take 1 tablet (80 mg total) by mouth daily. (Patient not taking: Reported on 07/19/2022) 90 tablet 3   furosemide (LASIX) 20 MG tablet Take 1 tablet (20 mg total) by mouth daily. (Patient not taking: Reported on 07/19/2022) 30 tablet 5   metoprolol succinate (TOPROL XL) 50 MG 24 hr tablet Take 1 tablet (50 mg total) by mouth daily. Take with 100 mg daily. Take with or immediately following a meal. 90  tablet 2   metoprolol succinate (TOPROL-XL) 100 MG 24 hr tablet Take 1 tablet (100 mg total) by mouth daily. Take with 50 mg for a total of 150 mg. Take with or immediately following a meal. 90 tablet 2   nicotine (NICODERM CQ - DOSED IN MG/24 HOURS) 21 mg/24hr patch Place 1 patch (21 mg total) onto the skin daily. 28 patch 0   nitroGLYCERIN (NITROSTAT) 0.4 MG SL tablet Place 1 tablet (0.4 mg total) under the tongue every 5 (five) minutes as needed for chest pain. 25 tablet 3   pantoprazole (PROTONIX) 40 MG tablet Take 1 tablet (40 mg total) by mouth daily. 30 tablet 5   potassium chloride (KLOR-CON) 10 MEQ tablet Take 1 tablet (10 mEq total) by mouth daily. With Lasix. 30 tablet 5   sacubitril-valsartan (ENTRESTO) 49-51 MG Take 0.5 tablets by mouth 2 (two) times daily. 60 tablet 3   sildenafil (VIAGRA) 50 MG tablet Take 1 tablet (50 mg total) by mouth daily as needed for erectile dysfunction. 10 tablet 2   albuterol (VENTOLIN HFA) 108 (90 Base) MCG/ACT inhaler Inhale 2 puffs into the lungs every 4 (four) hours as needed for wheezing or shortness of breath. 18 g 5    Fluticasone-Umeclidin-Vilant (TRELEGY ELLIPTA) 100-62.5-25 MCG/ACT AEPB Inhale 1 puff into the lungs daily. 1 each 11   No facility-administered medications prior to visit.    No Known Allergies  ROS Review of Systems  Constitutional:  Negative for chills and fever.  HENT:  Negative for congestion and sore throat.   Eyes:  Negative for pain and discharge.  Respiratory:  Positive for shortness of breath. Negative for cough and wheezing.   Cardiovascular:  Positive for palpitations. Negative for chest pain.  Gastrointestinal:  Negative for diarrhea, nausea and vomiting.  Endocrine: Negative for polydipsia and polyuria.  Genitourinary:  Negative for dysuria and hematuria.  Musculoskeletal:  Negative for neck pain and neck stiffness.  Skin:  Negative for rash.  Neurological:  Negative for dizziness, weakness, numbness and headaches.  Psychiatric/Behavioral:  Negative for agitation and behavioral problems.       Objective:    Physical Exam Vitals reviewed.  Constitutional:      General: He is not in acute distress.    Appearance: He is not diaphoretic.  HENT:     Head: Normocephalic and atraumatic.     Nose: Nose normal.     Mouth/Throat:     Mouth: Mucous membranes are moist.  Eyes:     General: No scleral icterus.    Extraocular Movements: Extraocular movements intact.  Cardiovascular:     Rate and Rhythm: Normal rate and regular rhythm.     Heart sounds: Normal heart sounds. No murmur heard. Pulmonary:     Breath sounds: Normal breath sounds. No wheezing or rales.  Musculoskeletal:     Cervical back: Neck supple. No tenderness.     Right lower leg: No edema.     Left lower leg: No edema.  Skin:    General: Skin is warm.     Findings: No rash.  Neurological:     General: No focal deficit present.     Mental Status: He is alert and oriented to person, place, and time.  Psychiatric:        Mood and Affect: Mood normal.        Behavior: Behavior normal.     BP  (!) 144/88 (BP Location: Left Arm)   Pulse 80   Ht 5\' 11"  (  1.803 m)   Wt 192 lb 6.4 oz (87.3 kg)   SpO2 97%   BMI 26.83 kg/m  Wt Readings from Last 3 Encounters:  08/08/22 192 lb 6.4 oz (87.3 kg)  07/19/22 196 lb (88.9 kg)  07/18/22 194 lb 12.8 oz (88.4 kg)    No results found for: "TSH" Lab Results  Component Value Date   WBC 11.8 (H) 03/14/2021   HGB 15.3 03/16/2021   HGB 15.3 03/16/2021   HCT 45.0 03/16/2021   HCT 45.0 03/16/2021   MCV 102.7 (H) 03/14/2021   PLT 282 03/14/2021   Lab Results  Component Value Date   NA 138 03/16/2021   NA 138 03/16/2021   K 4.2 03/16/2021   K 4.2 03/16/2021   CO2 26 03/14/2021   GLUCOSE 106 (H) 03/14/2021   BUN 16 03/14/2021   CREATININE 0.89 03/14/2021   BILITOT 0.4 03/09/2021   ALKPHOS 63 03/09/2021   AST 28 03/09/2021   ALT 18 03/09/2021   PROT 7.8 03/09/2021   ALBUMIN 4.1 03/09/2021   CALCIUM 9.4 03/14/2021   ANIONGAP 10 03/14/2021   Lab Results  Component Value Date   CHOL 146 10/09/2019   Lab Results  Component Value Date   HDL 39 (L) 10/09/2019   Lab Results  Component Value Date   LDLCALC 74 10/09/2019   Lab Results  Component Value Date   TRIG 165 (H) 10/09/2019   Lab Results  Component Value Date   CHOLHDL 3.7 10/09/2019   No results found for: "HGBA1C"    Assessment & Plan:   Problem List Items Addressed This Visit       Cardiovascular and Mediastinum   Essential hypertension - Primary (Chronic)    BP Readings from Last 1 Encounters:  08/08/22 (!) 148/108  uncontrolled due to noncompliance Needs to start taking Entresto, check BMP after 2 weeks Refilled Entresto 49-51 mg 1/2 tablet QD and metoprolol 150 mg QD Did not tolerate Entresto in the past, but agrees to try it for CHF -advised to discuss with cardiology Counseled for compliance with the medications Advised DASH diet and moderate exercise/walking, at least 150 mins/week      Chronic combined systolic and diastolic heart failure  (HCC)    History of ischemic cardiomyopathy Has chronic leg swelling, Lasix 20 mg QD for now On metoprolol, refilled 150 mg QD Refilled Entresto, discussed about its potential benefits - he agrees to take it. Followed by cardiology        Respiratory   COPD (chronic obstructive pulmonary disease) (HCC)    Well-controlled with Trelegy as needed albuterol He also uses albuterol neb as needed for dyspnea or wheezing Promethazine DM syrup as needed for cough Needs to cut down -> quit smoking      Relevant Medications   promethazine-dextromethorphan (PROMETHAZINE-DM) 6.25-15 MG/5ML syrup   Fluticasone-Umeclidin-Vilant (TRELEGY ELLIPTA) 100-62.5-25 MCG/ACT AEPB   albuterol (VENTOLIN HFA) 108 (90 Base) MCG/ACT inhaler     Other   Moderate episode of recurrent major depressive disorder (HCC)    Uncontrolled Has tried SSRIs in the past, but did not like the effects Referred to psychiatry as per patient request       Meds ordered this encounter  Medications   promethazine-dextromethorphan (PROMETHAZINE-DM) 6.25-15 MG/5ML syrup    Sig: Take 5 mLs by mouth 4 (four) times daily as needed for cough.    Dispense:  118 mL    Refill:  0   Fluticasone-Umeclidin-Vilant (TRELEGY ELLIPTA) 100-62.5-25 MCG/ACT AEPB  Sig: Inhale 1 puff into the lungs daily.    Dispense:  1 each    Refill:  11   albuterol (VENTOLIN HFA) 108 (90 Base) MCG/ACT inhaler    Sig: Inhale 2 puffs into the lungs every 4 (four) hours as needed for wheezing or shortness of breath.    Dispense:  18 g    Refill:  5    Follow-up: Return in about 3 months (around 11/08/2022) for HTN and CHF.    Anabel Halon, MD

## 2022-08-08 NOTE — Assessment & Plan Note (Addendum)
BP Readings from Last 1 Encounters:  08/08/22 (!) 148/108   uncontrolled due to noncompliance Needs to start taking Entresto, check BMP after 2 weeks Refilled Entresto 49-51 mg 1/2 tablet QD and metoprolol 150 mg QD Did not tolerate Entresto in the past, but agrees to try it for CHF -advised to discuss with cardiology Counseled for compliance with the medications Advised DASH diet and moderate exercise/walking, at least 150 mins/week

## 2022-08-08 NOTE — Patient Instructions (Signed)
Please start taking Entrestro half tablet twice daily and Metoprolol 150 mg once daily.  Continue taking Lasix 20 mg once daily.  Please try to cut down -> quit smoking.

## 2022-08-08 NOTE — Assessment & Plan Note (Addendum)
Well-controlled with Trelegy as needed albuterol He also uses albuterol neb as needed for dyspnea or wheezing Promethazine DM syrup as needed for cough Needs to cut down -> quit smoking

## 2022-08-08 NOTE — Assessment & Plan Note (Signed)
Uncontrolled Has tried SSRIs in the past, but did not like the effects Referred to psychiatry as per patient request 

## 2022-09-13 ENCOUNTER — Other Ambulatory Visit: Payer: Self-pay | Admitting: Internal Medicine

## 2022-09-13 DIAGNOSIS — J449 Chronic obstructive pulmonary disease, unspecified: Secondary | ICD-10-CM

## 2022-11-08 ENCOUNTER — Ambulatory Visit: Payer: Medicaid Other | Admitting: Internal Medicine

## 2022-11-10 ENCOUNTER — Telehealth: Payer: Self-pay | Admitting: Internal Medicine

## 2022-11-10 DIAGNOSIS — Z1159 Encounter for screening for other viral diseases: Secondary | ICD-10-CM | POA: Diagnosis not present

## 2022-11-10 DIAGNOSIS — F331 Major depressive disorder, recurrent, moderate: Secondary | ICD-10-CM | POA: Diagnosis not present

## 2022-11-10 DIAGNOSIS — R739 Hyperglycemia, unspecified: Secondary | ICD-10-CM | POA: Diagnosis not present

## 2022-11-10 DIAGNOSIS — E785 Hyperlipidemia, unspecified: Secondary | ICD-10-CM | POA: Diagnosis not present

## 2022-11-10 DIAGNOSIS — I5042 Chronic combined systolic (congestive) and diastolic (congestive) heart failure: Secondary | ICD-10-CM | POA: Diagnosis not present

## 2022-11-10 NOTE — Telephone Encounter (Signed)
error 

## 2022-11-11 LAB — CBC WITH DIFFERENTIAL/PLATELET: RBC: 4.41 x10E6/uL (ref 4.14–5.80)

## 2022-11-11 LAB — BASIC METABOLIC PANEL: Glucose: 103 mg/dL — ABNORMAL HIGH (ref 70–99)

## 2022-11-11 LAB — LIPID PANEL: LDL Chol Calc (NIH): 88 mg/dL (ref 0–99)

## 2022-11-29 ENCOUNTER — Encounter: Payer: Self-pay | Admitting: Internal Medicine

## 2022-11-29 ENCOUNTER — Ambulatory Visit (INDEPENDENT_AMBULATORY_CARE_PROVIDER_SITE_OTHER): Payer: Medicaid Other | Admitting: Internal Medicine

## 2022-11-29 VITALS — BP 152/92 | HR 91 | Ht 71.0 in | Wt 194.0 lb

## 2022-11-29 DIAGNOSIS — I5042 Chronic combined systolic (congestive) and diastolic (congestive) heart failure: Secondary | ICD-10-CM | POA: Diagnosis not present

## 2022-11-29 DIAGNOSIS — Z122 Encounter for screening for malignant neoplasm of respiratory organs: Secondary | ICD-10-CM

## 2022-11-29 DIAGNOSIS — F331 Major depressive disorder, recurrent, moderate: Secondary | ICD-10-CM

## 2022-11-29 DIAGNOSIS — M25552 Pain in left hip: Secondary | ICD-10-CM | POA: Diagnosis not present

## 2022-11-29 DIAGNOSIS — F1029 Alcohol dependence with unspecified alcohol-induced disorder: Secondary | ICD-10-CM

## 2022-11-29 DIAGNOSIS — Z720 Tobacco use: Secondary | ICD-10-CM

## 2022-11-29 DIAGNOSIS — F17210 Nicotine dependence, cigarettes, uncomplicated: Secondary | ICD-10-CM

## 2022-11-29 DIAGNOSIS — M25512 Pain in left shoulder: Secondary | ICD-10-CM | POA: Diagnosis not present

## 2022-11-29 DIAGNOSIS — J449 Chronic obstructive pulmonary disease, unspecified: Secondary | ICD-10-CM

## 2022-11-29 DIAGNOSIS — G8929 Other chronic pain: Secondary | ICD-10-CM

## 2022-11-29 MED ORDER — PREDNISONE 20 MG PO TABS
40.0000 mg | ORAL_TABLET | Freq: Every day | ORAL | 0 refills | Status: DC
Start: 2022-11-29 — End: 2023-01-25

## 2022-11-29 MED ORDER — ENTRESTO 49-51 MG PO TABS: 1.0000 | ORAL_TABLET | Freq: Two times a day (BID) | ORAL | 1 refills | Status: DC

## 2022-11-29 NOTE — Progress Notes (Unsigned)
Established Patient Office Visit  Subjective:  Patient ID: Timothy Walker, male    DOB: 07/13/1965  Age: 57 y.o. MRN: [id number]  CC:  Chief Complaint  Patient presents with   Hypertension    Follow up    Depression    Patient feels he is still having major depression problems    Shoulder Pain    Patient is having a lot of pain in his left shoulder and down in his arm     HPI Timothy Walker is a 57 y.o. male with past medical history of CAD status post stent placement, HFrEF, HTN, COPD, GERD and tobacco abuse who presents for f/u of his chronic medical conditions.  CAD s/p stent placement, HFrEF and HTN:  His BP was elevated in the office today, but better compared to prior.  He reports improvement in chronic, intermittent bilateral leg swelling and hand swelling since starting Lasix.  He has been taking Entresto 49-51 mg QD, and metoprolol 100 mg QD for history of CAD and HTN. He denies any headache, dizziness or chest pain currently.  He has intermittent palpitations despite taking Metoprolol.  He takes about 2 shots of bourbon every 3-4 days. He was on aspirin and statin currently for CAD.  COPD: He reports recent worsening of cough and dyspnea for the last 10 days. He uses albuterol inhaler and nebulizer as needed for dyspnea or wheezing.  He has Trelegy, but compliance has been questionable.  He smokes about 0.5 pack/day.  GERD: He takes pantoprazole for it.  MDD: He reports difficulty, insomnia, chronic fatigue and trouble with concentration.  He has tried multiple antidepressants, and did not like the effects of them.  He prefers to see psychiatrist. Referral was placed, but he did not schedule appointment.  Denies any SI or HI currently.  He c/o left shoulder pain for the last 4 weeks. Pain is constant, sharp, radiating towards the arm and worse with movement. Denies any numbness or tingling of the UE. He also c/o chronic hip pain, has h/o OA of knee. Denies any fall or injury  recently.  Past Medical History:  Diagnosis Date   Cardiogenic shock (HCC) 01/13/2013   COPD (chronic obstructive pulmonary disease) (HCC)    Coronary artery disease    a. 12/2012 Inf STEMI/Cath/PCI: LM nl, LAD nl, D1 50ost, D2 min irregs, D3 small, LCX 80-62m, OM1/2/3 min irregs, RI 20p, RCA 50p/100d (3.5x18 Xience DEs),PDA/PL nl, EF 55% - complicated by VF/CGS/IABP/VDRF   GERD (gastroesophageal reflux disease)    Hiatal hernia    Hypertension    Ischemic cardiomyopathy    a. 12/2012 Echo: EF 45-50%, basal inf, inflat, mid inf HK, mildly reduced RV fxn.   Marijuana abuse    MI (myocardial infarction) (HCC) 2014   NSTEMI (non-ST elevated myocardial infarction) (HCC) 10/07/2019   Obstructive sleep apnea    ST elevation myocardial infarction (STEMI) of inferior wall, initial episode of care (HCC) 01/16/2013   Tobacco abuse    Ventricular fibrillation (HCC) 01/17/2013    Past Surgical History:  Procedure Laterality Date   COLONOSCOPY WITH PROPOFOL N/A 05/14/2014   Dr. Jena Gauss: anal canal hemorrhoid, rectosigmoid hyerpplastic polyp, right-sided divetriculosis    CORONARY ANGIOPLASTY  01/13/2013   STENT TO RCA BY DR Excell Seltzer   CORONARY STENT INTERVENTION N/A 10/07/2019   Procedure: CORONARY STENT INTERVENTION;  Surgeon: Marykay Lex, MD;  Location: [facility] INVASIVE [facility];  Service: Cardiovascular;  Laterality: N/A;   CORONARY/GRAFT ACUTE MI REVASCULARIZATION N/A  10/07/2019   Procedure: Coronary/Graft Acute MI Revascularization;  Surgeon: Marykay Lex, MD;  Location: [facility] INVASIVE CV LAB;  Service: Cardiovascular;  Laterality: N/A;   DENTAL SURGERY     ESOPHAGEAL DILATION N/A 05/14/2014   Procedure: ESOPHAGEAL DILATION [address] MALONEY;  Surgeon: Corbin Ade, MD;  Location: [facility];  Service: Endoscopy;  Laterality: N/A;   ESOPHAGOGASTRODUODENOSCOPY (EGD) WITH PROPOFOL N/A 05/14/2014   Dr. Jena Gauss: empiric dilation, abnormal esophagus s/p biopsy (normal), small hiatal hernia   LEFT HEART CATH  AND CORONARY ANGIOGRAPHY N/A 09/26/2018   Procedure: LEFT HEART CATH AND CORONARY ANGIOGRAPHY;  Surgeon: Runell Gess, MD;  Location: [facility] CV [facility];  Service: Cardiovascular;  Laterality: N/A;   LEFT HEART CATH AND CORONARY ANGIOGRAPHY N/A 10/07/2019   Procedure: LEFT HEART CATH AND CORONARY ANGIOGRAPHY;  Surgeon: Marykay Lex, MD;  Location: [facility] INVASIVE CV LAB;  Service: Cardiovascular;  Laterality: N/A;   LEFT HEART CATHETERIZATION WITH CORONARY ANGIOGRAM N/A 01/13/2013   Procedure: LEFT HEART CATHETERIZATION WITH CORONARY ANGIOGRAM;  Surgeon: Iran Ouch, MD;  Location: [facility] CATH LAB;  Service: Cardiovascular;  Laterality: N/A;   NASAL SEPTUM SURGERY     PERCUTANEOUS CORONARY STENT INTERVENTION (PCI-S)  01/13/2013   Procedure: PERCUTANEOUS CORONARY STENT INTERVENTION (PCI-S);  Surgeon: Iran Ouch, MD;  Location: [facility] CATH LAB;  Service: Cardiovascular;;   POLYPECTOMY  05/14/2014   Procedure: POLYPECTOMY;  Surgeon: Corbin Ade, MD;  Location: [facility];  Service: Endoscopy;;   RIGHT/LEFT HEART CATH AND CORONARY ANGIOGRAPHY N/A 03/16/2021   Procedure: RIGHT/LEFT HEART CATH AND CORONARY ANGIOGRAPHY;  Surgeon: Swazil[place], Peter M, MD;  Location: [facility] INVASIVE CV LAB;  Service: Cardiovascular;  Laterality: N/A;    Family History  Problem Relation Age of Onset   Heart attack Other    Hypertension Other    Colon cancer Neg Hx     Social History   Socioeconomic History   Marital status: Divorced    Spouse name: Not on file   Number of children: Not on file   Years of education: Not on file   Highest education level: Not on file  Occupational History   Not on file  Tobacco Use   Smoking status: Every Day    Current packs/day: 0.75    Average packs/day: 0.8 packs/day for 41.0 years (30.8 ttl pk-yrs)    Types: Cigarettes    Start date: 11/21/1981   Smokeless tobacco: Never  Vaping Use   Vaping status: Some Days   Substances: Nicotine, CBD  Substance and Sexual Activity    Alcohol use: Not Currently    Comment: 2-4 shots of burbon daily, if off work may be a little more   Drug use: Yes    Types: Marijuana    Comment: daily   Sexual activity: Yes  Other Topics Concern   Not on file  Social History Narrative   Not on file   Social Determinants of Health   Financial Resource Strain: Not on file  Food Insecurity: Not on file  Transportation Needs: Not on file  Physical Activity: Not on file  Stress: Not on file  Social Connections: Not on file  Intimate Partner Violence: Not on file    Outpatient Medications Prior to Visit  Medication Sig Dispense Refill   albuterol (PROVENTIL) (2.5 MG/3ML) 0.083% nebulizer solution Take 3 mLs (2.5 mg total) by nebulization every 2 (two) hours as needed for wheezing or shortness of breath. 75 mL 11   albuterol (VENTOLIN HFA)  108 (90 Base) MCG/ACT inhaler INHALE 2 PUFFS INTO THE LUNGS EVERY 4 HOURS AS NEEDED FOR WHEEZING OR SHORTNESS OF BREATH. 8.5 each 5   aspirin EC 81 MG EC tablet Take 1 tablet (81 mg total) by mouth daily with breakfast. Swallow whole. 30 tablet 11   atorvastatin (LIPITOR) 80 MG tablet Take 1 tablet (80 mg total) by mouth daily. (Patient not taking: Reported on 07/19/2022) 90 tablet 3   Fluticasone-Umeclidin-Vilant (TRELEGY ELLIPTA) 100-62.5-25 MCG/ACT AEPB Inhale 1 puff into the lungs daily. 1 each 11   furosemide (LASIX) 20 MG tablet Take 1 tablet (20 mg total) by mouth daily. 30 tablet 5   metoprolol succinate (TOPROL XL) 50 MG 24 hr tablet Take 1 tablet (50 mg total) by mouth daily. Take with 100 mg daily. Take with or immediately following a meal. 90 tablet 2   metoprolol succinate (TOPROL-XL) 100 MG 24 hr tablet Take 1 tablet (100 mg total) by mouth daily. Take with 50 mg for a total of 150 mg. Take with or immediately following a meal. 90 tablet 2   nitroGLYCERIN (NITROSTAT) 0.4 MG SL tablet Place 1 tablet (0.4 mg total) under the tongue every 5 (five) minutes as needed for chest pain. 25 tablet 3    pantoprazole (PROTONIX) 40 MG tablet Take 1 tablet (40 mg total) by mouth daily. 30 tablet 5   potassium chloride (KLOR-CON) 10 MEQ tablet Take 1 tablet (10 mEq total) by mouth daily. With Lasix. 30 tablet 5   promethazine-dextromethorphan (PROMETHAZINE-DM) 6.25-15 MG/5ML syrup Take 5 mLs by mouth 4 (four) times daily as needed for cough. 118 mL 0   sildenafil (VIAGRA) 50 MG tablet Take 1 tablet (50 mg total) by mouth daily as needed for erectile dysfunction. 10 tablet 2   nicotine (NICODERM CQ - DOSED IN MG/24 HOURS) 21 mg/24hr patch Place 1 patch (21 mg total) onto the skin daily. 28 patch 0   sacubitril-valsartan (ENTRESTO) 49-51 MG Take 0.5 tablets by mouth 2 (two) times daily. 60 tablet 3   No facility-administered medications prior to visit.    No Known Allergies  ROS Review of Systems  Constitutional:  Negative for chills and fever.  HENT:  Negative for congestion and sore throat.   Eyes:  Negative for pain and discharge.  Respiratory:  Positive for cough and shortness of breath.   Cardiovascular:  Positive for palpitations. Negative for chest pain.  Gastrointestinal:  Negative for diarrhea, nausea and vomiting.  Endocrine: Negative for polydipsia and polyuria.  Genitourinary:  Negative for dysuria and hematuria.  Musculoskeletal:  Negative for neck pain and neck stiffness.  Skin:  Negative for rash.  Neurological:  Negative for dizziness, weakness, numbness and headaches.  Psychiatric/Behavioral:  Positive for dysphoric mood and sleep disturbance. Negative for agitation and behavioral problems. The patient is nervous/anxious.       Objective:    Physical Exam Vitals reviewed.  Constitutional:      General: He is not in acute distress.    Appearance: He is not diaphoretic.  HENT:     Head: Normocephalic and atraumatic.     Nose: Nose normal.     Mouth/Throat:     Mouth: Mucous membranes are moist.  Eyes:     General: No scleral icterus.    Extraocular Movements:  Extraocular movements intact.  Cardiovascular:     Rate and Rhythm: Normal rate and regular rhythm.     Heart sounds: Normal heart sounds. No murmur heard. Pulmonary:  Breath sounds: Normal breath sounds. No wheezing or rales.  Musculoskeletal:     Cervical back: Neck supple. No tenderness.     Right lower leg: No edema.     Left lower leg: No edema.  Skin:    General: Skin is warm.     Findings: No rash.  Neurological:     General: No focal deficit present.     Mental Status: He is alert and oriented to person, place, and time.  Psychiatric:        Mood and Affect: Mood is depressed.        Behavior: Behavior is cooperative.     BP (!) 152/92 (BP Location: Right Arm)   Pulse 91   Ht 5\' 11"  (1.803 m)   Wt 194 lb (88 kg)   SpO2 93%   BMI 27.06 kg/m  Wt Readings from Last 3 Encounters:  11/29/22 194 lb (88 kg)  08/08/22 192 lb 6.4 oz (87.3 kg)  07/19/22 196 lb (88.9 kg)    Lab Results  Component Value Date   TSH 2.270 11/10/2022   Lab Results  Component Value Date   WBC 9.3 11/10/2022   HGB 15.1 11/10/2022   HCT 44.0 11/10/2022   MCV 100 (H) 11/10/2022   PLT 289 11/10/2022   Lab Results  Component Value Date   NA 140 11/10/2022   K 4.4 11/10/2022   CO2 20 11/10/2022   GLUCOSE 103 (H) 11/10/2022   BUN 8 11/10/2022   CREATININE 0.84 11/10/2022   BILITOT 0.4 03/09/2021   ALKPHOS 63 03/09/2021   AST 28 03/09/2021   ALT 18 03/09/2021   PROT 7.8 03/09/2021   ALBUMIN 4.1 03/09/2021   CALCIUM 9.9 11/10/2022   ANIONGAP 10 03/14/2021   EGFR 103 11/10/2022   Lab Results  Component Value Date   CHOL 157 11/10/2022   Lab Results  Component Value Date   HDL 39 (L) 11/10/2022   Lab Results  Component Value Date   LDLCALC 88 11/10/2022   Lab Results  Component Value Date   TRIG 177 (H) 11/10/2022   Lab Results  Component Value Date   CHOLHDL 4.0 11/10/2022   Lab Results  Component Value Date   HGBA1C 5.9 (H) 11/10/2022      Assessment &  Plan:   Problem List Items Addressed This Visit       Cardiovascular and Mediastinum   Chronic combined systolic and diastolic heart failure (HCC)    History of ischemic cardiomyopathy Has chronic leg swelling, Lasix 20 mg QD for now On metoprolol, refilled 150 mg QD Increased dose of Entresto to 49-51 mg BID, discussed about its potential benefits - he agrees to take it regularly Followed by cardiology      Relevant Medications   sacubitril-valsartan (ENTRESTO) 49-51 MG     Respiratory   COPD (chronic obstructive pulmonary disease) (HCC)    Recent worsening of cough and dyspnea likely due to COPD exacerbation Started Prednisone 40 mg X 5 days Usually well-controlled with Trelegy as needed albuterol He also uses albuterol neb as needed for dyspnea or wheezing Promethazine DM syrup as needed for cough Needs to cut down -> quit smoking      Relevant Medications   predniSONE (DELTASONE) 20 MG tablet     Other   Tobacco abuse (Chronic)    Smokes about 0.5 pack/day  Asked about quitting: confirms that he/she currently smokes cigarettes Advise to quit smoking: Educated about QUITTING to reduce the risk of  cancer, cardio and cerebrovascular disease. Assess willingness: Unwilling to quit at this time, but is working on cutting back. Assist with counseling and pharmacotherapy: Counseled for 5 minutes and literature provided. Arrange for follow up: follow up in 3 months and continue to offer help.      Alcohol dependence (HCC)    Takes about 3-4 drinks (bourbon) on some days in a week Needs to cut down alcohol use as it is worsening his CHF and MDD      Moderate episode of recurrent major depressive disorder (HCC) - Primary    Uncontrolled Has tried SSRIs in the past, but did not like the effects Referred to psychiatry as per patient request - but he did not follow up      Chronic left shoulder pain    Likely due to biceps tendonitis and/or rotator cuff tear Unable to take  oral NSAIDs due to his cardiac history, also takes Aspirin 81 mg QD Prednisone for 5 days Avoid heavy lifting      Relevant Medications   predniSONE (DELTASONE) 20 MG tablet   Pain of left hip    Since 07/26/22 Likely has OA of hip X-ray of hip (08/21): Calcific tendinosis lateral left hip joint near the superolateral left acetabulum Referred to Orthopedic surgery      Relevant Orders   Ambulatory referral to Orthopedic Surgery   Screening for lung cancer    Has > 20-pack-year smoking history Ordered low-dose CT chest after discussing with the patient.       Relevant Orders   CT CHEST LUNG CANCER SCREENING LOW DOSE WO CONTRAST     Meds ordered this encounter  Medications   sacubitril-valsartan (ENTRESTO) 49-51 MG    Sig: Take 1 tablet by mouth 2 (two) times daily.    Dispense:  60 tablet    Refill:  1   predniSONE (DELTASONE) 20 MG tablet    Sig: Take 2 tablets (40 mg total) by mouth daily with breakfast.    Dispense:  10 tablet    Refill:  0    Follow-up: Return in about 4 months (around 03/31/2023).    Anabel Halon, MD

## 2022-11-29 NOTE — Assessment & Plan Note (Signed)
History of ischemic cardiomyopathy Has chronic leg swelling, Lasix 20 mg QD for now On metoprolol, refilled 150 mg QD Refilled Entresto, discussed about its potential benefits - he agrees to take it. Followed by cardiology 

## 2022-11-29 NOTE — Patient Instructions (Addendum)
Please start taking Entresto 1 tablet twice a day.  Please start taking Prednisone as prescribed for shoulder pain.  Please continue to follow low salt diet and ambulate as tolerated.

## 2022-11-29 NOTE — Assessment & Plan Note (Signed)
Takes about 3-4 drinks (bourbon) on some days in a week Needs to cut down alcohol use as it is worsening his CHF and MDD

## 2022-11-29 NOTE — Assessment & Plan Note (Addendum)
Uncontrolled Has tried SSRIs in the past, but did not like the effects Referred to psychiatry as per patient request - but he did not follow up

## 2022-11-30 ENCOUNTER — Encounter: Payer: Self-pay | Admitting: Internal Medicine

## 2022-11-30 DIAGNOSIS — Z122 Encounter for screening for malignant neoplasm of respiratory organs: Secondary | ICD-10-CM | POA: Insufficient documentation

## 2022-11-30 NOTE — Assessment & Plan Note (Signed)
Has > 20-pack-year smoking history Ordered low-dose CT chest after discussing with the patient.

## 2022-11-30 NOTE — Assessment & Plan Note (Addendum)
Since 07/26/22 Likely has OA of hip X-ray of hip (08/21): Calcific tendinosis lateral left hip joint near the superolateral left acetabulum Referred to Orthopedic surgery

## 2022-11-30 NOTE — Assessment & Plan Note (Signed)
Likely due to biceps tendonitis and/or rotator cuff tear Unable to take oral NSAIDs due to his cardiac history, also takes Aspirin 81 mg QD Prednisone for 5 days Avoid heavy lifting

## 2022-11-30 NOTE — Assessment & Plan Note (Signed)
Smokes about 0.5 pack/day  Asked about quitting: confirms that he/she currently smokes cigarettes Advise to quit smoking: Educated about QUITTING to reduce the risk of cancer, cardio and cerebrovascular disease. Assess willingness: Unwilling to quit at this time, but is working on cutting back. Assist with counseling and pharmacotherapy: Counseled for 5 minutes and literature provided. Arrange for follow up: follow up in 3 months and continue to offer help. 

## 2022-11-30 NOTE — Assessment & Plan Note (Signed)
Recent worsening of cough and dyspnea likely due to COPD exacerbation Started Prednisone 40 mg X 5 days Usually well-controlled with Trelegy as needed albuterol He also uses albuterol neb as needed for dyspnea or wheezing Promethazine DM syrup as needed for cough Needs to cut down -> quit smoking

## 2022-12-22 ENCOUNTER — Ambulatory Visit (HOSPITAL_COMMUNITY)
Admission: RE | Admit: 2022-12-22 | Discharge: 2022-12-22 | Disposition: A | Discharge status: Home / Self Care | Payer: Medicaid Other | Source: Ambulatory Visit | Attending: Internal Medicine | Admitting: Internal Medicine

## 2022-12-22 DIAGNOSIS — Z122 Encounter for screening for malignant neoplasm of respiratory organs: Secondary | ICD-10-CM | POA: Insufficient documentation

## 2022-12-25 ENCOUNTER — Other Ambulatory Visit: Payer: Self-pay | Admitting: Internal Medicine

## 2022-12-25 DIAGNOSIS — I5042 Chronic combined systolic (congestive) and diastolic (congestive) heart failure: Secondary | ICD-10-CM

## 2023-01-22 ENCOUNTER — Ambulatory Visit: Payer: Medicaid Other | Admitting: Cardiology

## 2023-01-25 ENCOUNTER — Encounter: Payer: Self-pay | Admitting: Cardiology

## 2023-01-25 ENCOUNTER — Ambulatory Visit: Payer: Medicaid Other | Attending: Cardiology | Admitting: Cardiology

## 2023-01-25 VITALS — BP 132/84 | HR 76 | Ht 71.0 in | Wt 195.6 lb

## 2023-01-25 DIAGNOSIS — I5022 Chronic systolic (congestive) heart failure: Secondary | ICD-10-CM

## 2023-01-25 DIAGNOSIS — I493 Ventricular premature depolarization: Secondary | ICD-10-CM

## 2023-01-25 DIAGNOSIS — E782 Mixed hyperlipidemia: Secondary | ICD-10-CM

## 2023-01-25 DIAGNOSIS — I10 Essential (primary) hypertension: Secondary | ICD-10-CM

## 2023-01-25 DIAGNOSIS — E785 Hyperlipidemia, unspecified: Secondary | ICD-10-CM

## 2023-01-25 DIAGNOSIS — I2510 Atherosclerotic heart disease of native coronary artery without angina pectoris: Secondary | ICD-10-CM

## 2023-01-25 MED ORDER — POTASSIUM CHLORIDE ER 10 MEQ PO TBCR: 10.0000 meq | EXTENDED_RELEASE_TABLET | Freq: Every day | ORAL | 1 refills | Status: AC

## 2023-01-25 MED ORDER — ROSUVASTATIN CALCIUM 40 MG PO TABS: 40.0000 mg | ORAL_TABLET | Freq: Every day | ORAL | 6 refills | Status: AC

## 2023-01-25 NOTE — Progress Notes (Signed)
Clinical Summary Mr. Politis is a 57 y.o.male seen today for follow up of the following medical problems.      1. CAD   - admit to [facility] 01/13/13 to 01/17/13 with STEMI, DES to RCA.   - patient presented in cardiogenic shock, suffered VT/VF arrest requiring multiple defibrillations and IV amiodarone. Following PCI required balloon pump and vasopressors and temporary transvenous pacing.   - Echo LVEF 45-50%, inferior hypokinesis, normal diastolic function        09/2018 cath mid LAD 30%, ramus 30%, LCX mid 90% small nondom and stable, RCA 30% prox. 09/2018 echo LVEF 45-50%, grade I diastolic dysfunction, basal mid inferior akinesis.      09/2019 admitted with NSTEMI - had DES to LCX, during procedure VT requiring defib - Echo showed LVEF of 40-45%     02/2021 cath: nonobstructive CAD, patent stents   - no chest pains, some ongoing SOB/DOE. +cough, +wheezing. Can be productive  - compliant with meds      2. Chronic systolic HF 09/2019 echo: 40-45% 02/2021 echo LVEF 30-35%  02/2021 cath: nonobstructive CAD, patent stents   - entresto caused dizziness, last visit we tried lowering dose to 24/26mg  bid     3. HTN   -not compliant with meds   4. Hyperlipidemia   - not compliant with statin 10/2022 TC 157 TG 177 HDL 39 LDL 88  5. COPD - abnormal PFTs in 2015 - mixed compliance with inhalers.  - pcp started trelegy just today   6. PVCs - long history of PVCs - frequent PVCs noted during recent admission, beta blocker titrated up - ongoing symptoms of palpitations, heart fluttering with some chest discomfort   - 05/2021 monitor frequent PVCs (5.7% burden), 9 runs of NSVT lognest 6 beats - ongoing symptoms, daily.  - cutting back on EtOH, cigarette   - palpitations are improving, short infrequent episodes.    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 12/2012 Inf STEMI/Cath/PCI: LM nl, LAD nl, D1 50ost, D2 min irregs, D3 small, LCX 80-68m, OM1/2/3 min irregs, RI 20p, RCA 50p/100d (3.5x18 Xience DEs),PDA/PL nl, EF 55% - complicated by VF/CGS/IABP/VDRF   GERD (gastroesophageal reflux disease)    Hiatal hernia    Hypertension    Ischemic cardiomyopathy    a. 12/2012 Echo: EF 45-50%, basal inf, inflat, mid inf HK, mildly reduced RV fxn.   Marijuana abuse    MI (myocardial infarction) (HCC) 2014   NSTEMI (non-ST elevated myocardial infarction) (HCC) 10/07/2019   Obstructive sleep apnea    ST elevation myocardial infarction (STEMI) of inferior wall, initial episode of care (HCC) 01/16/2013   Tobacco abuse    Ventricular fibrillation (HCC) 01/17/2013     No Known Allergies   Current Outpatient Medications  Medication Sig Dispense Refill   albuterol (PROVENTIL) (2.5 MG/3ML) 0.083% nebulizer solution Take 3 mLs (2.5 mg total) by nebulization every 2 (two) hours as needed for wheezing or shortness of breath. 75 mL 11   albuterol (VENTOLIN HFA) 108 (90 Base) MCG/ACT inhaler INHALE 2 PUFFS INTO THE LUNGS EVERY 4 HOURS AS NEEDED FOR WHEEZING OR SHORTNESS OF BREATH. 8.5 each 5   aspirin EC 81 MG EC tablet Take 1 tablet (81 mg total) by mouth  daily with breakfast. Swallow whole. 30 tablet 11   Fluticasone-Umeclidin-Vilant (TRELEGY ELLIPTA) 100-62.5-25 MCG/ACT AEPB Inhale 1 puff into the lungs daily. 1 each 11   furosemide (LASIX) 20 MG tablet TAKE 1 TABLET BY MOUTH EVERY DAY 90 tablet 1   metoprolol succinate (TOPROL XL) 50 MG 24 hr tablet Take 1 tablet (50 mg total) by mouth daily. Take with 100 mg daily. Take with or immediately following a meal. 90 tablet 2   metoprolol succinate (TOPROL-XL) 100 MG 24 hr tablet Take 1 tablet (100 mg total) by mouth daily. Take with 50 mg for a total of 150 mg. Take with or immediately following  a meal. 90 tablet 2   nitroGLYCERIN (NITROSTAT) 0.4 MG SL tablet Place 0.4 mg under the tongue every 5 (five) minutes x 3 doses as needed for chest pain (if no relief after 2nd dose, proceed to ED or call 911).     pantoprazole (PROTONIX) 40 MG tablet Take 1 tablet (40 mg total) by mouth daily. 30 tablet 5   promethazine-dextromethorphan (PROMETHAZINE-DM) 6.25-15 MG/5ML syrup Take 5 mLs by mouth 4 (four) times daily as needed for cough. 118 mL 0   sacubitril-valsartan (ENTRESTO) 49-51 MG Take 1 tablet by mouth 2 (two) times daily. 60 tablet 1   sildenafil (VIAGRA) 50 MG tablet Take 1 tablet (50 mg total) by mouth daily as needed for erectile dysfunction. 10 tablet 2   atorvastatin (LIPITOR) 80 MG tablet Take 1 tablet (80 mg total) by mouth daily. (Patient not taking: Reported on 01/25/2023) 90 tablet 3   potassium chloride (KLOR-CON) 10 MEQ tablet Take 1 tablet (10 mEq total) by mouth daily. With Lasix. (Patient not taking: Reported on 01/25/2023) 30 tablet 5   No current facility-administered medications for this visit.     Past Surgical History:  Procedure Laterality Date   COLONOSCOPY WITH PROPOFOL N/A 05/14/2014   Dr. Jena Gauss: anal canal hemorrhoid, rectosigmoid hyerpplastic polyp, right-sided divetriculosis    CORONARY ANGIOPLASTY  01/13/2013   STENT TO RCA BY DR Excell Seltzer   CORONARY STENT INTERVENTION N/A 10/07/2019   Procedure: CORONARY STENT INTERVENTION;  Surgeon: Marykay Lex, MD;  Location: [facility] INVASIVE CV LAB;  Service: Cardiovascular;  Laterality: N/A;   CORONARY/GRAFT ACUTE MI REVASCULARIZATION N/A 10/07/2019   Procedure: Coronary/Graft Acute MI Revascularization;  Surgeon: Marykay Lex, MD;  Location: [facility] INVASIVE CV LAB;  Service: Cardiovascular;  Laterality: N/A;   DENTAL SURGERY     ESOPHAGEAL DILATION N/A 05/14/2014   Procedure: ESOPHAGEAL DILATION 57 FREN[address] MALONEY;  Surgeon: Corbin Ade, MD;  Location: [facility] ORS;  Service: Endoscopy;  Laterality: N/A;    ESOPHAGOGASTRODUODENOSCOPY (EGD) WITH PROPOFOL N/A 05/14/2014   Dr. Jena Gauss: empiric dilation, abnormal esophagus s/p biopsy (normal), small hiatal hernia   LEFT HEART CATH AND CORONARY ANGIOGRAPHY N/A 09/26/2018   Procedure: LEFT HEART CATH AND CORONARY ANGIOGRAPHY;  Surgeon: Runell Gess, MD;  Location: [facility] INVASIVE CV LAB;  Service: Cardiovascular;  Laterality: N/A;   LEFT HEART CATH AND CORONARY ANGIOGRAPHY N/A 10/07/2019   Procedure: LEFT HEART CATH AND CORONARY ANGIOGRAPHY;  Surgeon: Marykay Lex, MD;  Location: [facility] INVASIVE CV LAB;  Service: Cardiovascular;  Laterality: N/A;   LEFT HEART CATHETERIZATION WITH CORONARY ANGIOGRAM N/A 01/13/2013   Procedure: LEFT HEART CATHETERIZATION WITH CORONARY ANGIOGRAM;  Surgeon: Iran Ouch, MD;  Location: [facility] CATH LAB;  Service: Cardiovascular;  Laterality: N/A;   NASAL SEPTUM SURGERY     PERCUTANEOUS CORONARY STENT INTERVENTION (PCI-S)  01/13/2013  Procedure: PERCUTANEOUS CORONARY STENT INTERVENTION (PCI-S);  Surgeon: Iran Ouch, MD;  Location: [facility] CATH LAB;  Service: Cardiovascular;;   POLYPECTOMY  05/14/2014   Procedure: POLYPECTOMY;  Surgeon: Corbin Ade, MD;  Location: [facility] ORS;  Service: Endoscopy;;   RIGHT/LEFT HEART CATH AND CORONARY ANGIOGRAPHY N/A 03/16/2021   Procedure: RIGHT/LEFT HEART CATH AND CORONARY ANGIOGRAPHY;  Surgeon: Swaziland, Peter M, MD;  Location: [facility] INVASIVE CV LAB;  Service: Cardiovascular;  Laterality: N/A;     No Known Allergies    Family History  Problem Relation Age of Onset   Heart attack Other    Hypertension Other    Colon cancer Neg Hx      Social History Mr. Krukowski reports that he has been smoking cigarettes. He started smoking about 41 years ago. He has a 30.9 pack-year smoking history. He has never used smokeless tobacco. Mr. Trippe reports that he does not currently use alcohol.   Review of Systems CONSTITUTIONAL: No weight loss, fever, chills, weakness or fatigue.  HEENT: Eyes: No visual  loss, blurred vision, double vision or yellow sclerae.No hearing loss, sneezing, congestion, runny nose or sore throat.  SKIN: No rash or itching.  CARDIOVASCULAR: per hpi RESPIRATORY: No shortness of breath, cough or sputum.  GASTROINTESTINAL: No anorexia, nausea, vomiting or diarrhea. No abdominal pain or blood.  GENITOURINARY: No burning on urination, no polyuria NEUROLOGICAL: No headache, dizziness, syncope, paralysis, ataxia, numbness or tingling in the extremities. No change in bowel or bladder control.  MUSCULOSKELETAL: No muscle, back pain, joint pain or stiffness.  LYMPHATICS: No enlarged nodes. No history of splenectomy.  PSYCHIATRIC: No history of depression or anxiety.  ENDOCRINOLOGIC: No reports of sweating, cold or heat intolerance. No polyuria or polydipsia.  Marland Kitchen   Physical Examination Today's Vitals   01/25/23 1531 01/25/23 1543 01/25/23 1627  BP: (!) 144/110 (!) 144/100 132/84  Pulse: 82 76   SpO2: 96% 96%   Weight: 195 lb 9.6 oz (88.7 kg)    Height: 5\' 11"  (1.803 m)     Body mass index is 27.28 kg/m.  Gen: resting comfortably, no acute distress HEENT: no scleral icterus, pupils equal round and reactive, no palptable cervical adenopathy,  CV: RRR, no m/rg, no jvd Resp: Clear to auscultation bilaterally GI: abdomen is soft, non-tender, non-distended, normal bowel sounds, no hepatosplenomegaly MSK: extremities are warm, no edema.  Skin: warm, no rash Neuro:  no focal deficits Psych: appropriate affect   Diagnostic Studies  09/2018 echo IMPRESSIONS      1. The left ventricle has mildly reduced systolic function, with an ejection fraction of 45-50%. The cavity size was mildly dilated. There is mildly increased left ventricular wall thickness. Left ventricular diastolic Doppler parameters are consistent with impaired relaxation.  2. There is akinesis of the left ventricular, basal-mid inferior wall.  3. The right ventricle has normal systolic function. The  cavity was normal. There is no increase in right ventricular wall thickness. Right ventricular systolic pressure is mildly elevated with an estimated pressure of 39.8 mmHg.  4. The mitral valve is grossly normal. There is mild mitral regurgitation.  5. The tricuspid valve is grossly normal.  6. The aortic valve is tricuspid.  7. The aortic root is normal in size and structure.  8. The inferior vena cava was dilated in size with >50% respiratory variability.   09/2018 cath Previously placed Mid RCA to Dist RCA stent (unknown type) is widely patent. Mid Cx lesion is 90% stenosed. Mid LAD lesion is 30%  stenosed. Prox RCA lesion is 30% stenosed. Ramus lesion is 30% stenosed. There is moderate left ventricular systolic dysfunction. LV end diastolic pressure is mildly elevated. The left ventricular ejection fraction is 35-45% by visual estimate.   BURRIS ALBERS is a 57 y.o. male IMPRESSION: Mr. Pulling distal dominant RCA stent was widely patent.  He had high-grade lesion in a nondominant circumflex similar as described at the cath in 2014.  He had moderate LV dysfunction with an EF in the 40% range and severe inferior hypokinesia.  I do not see a culprit vessel new since his previous cath.  He does have moderate LV dysfunction and medical therapy will will be recommended for this.  The sheath was removed and a TR band was placed on the right wrist to achieve patent hemostasis.  The patient left the lab in stable condition.  He can be discharged home later today with TOC 7 followed by return office visit with Dr. Wyline Mood in 3 to 4 weeks.   09/2019 cath Left Heart Catheterization 10/07/2019: There is mild left ventricular systolic dysfunction. The left ventricular ejection fraction is 45-50% by visual LV end diastolic pressure is normal. Coronary anatomy: Prox RCA lesion is 30% stenosed. Previously placed Mid RCA to Dist RCA stent (unknown type) is widely patent. Ramus lesion is 30% stenosed. Prox  Cx to Dist Cx lesion is 100% stenosed with 99% stenosed side Jenelle Drennon in 1st Mrg. LPAV lesion is 80% stenosed. A drug-eluting stent was successfully placed crossing from the small 1st Mrg into the LCx-2nd Mrg, using a SYNERGY XD 2.50X20. Post intervention, there is a 0% residual stenosis. Post intervention, the side Loreda Silverio was reduced to 99% residual stenosis. A drug-eluting stent was successfully placed using a SYNERGY XD 2.50X20.   Summary: Two-Vessel Disease: With widely patent distal RCA stent and Toprol lesion being 100% proximal to mid LCx occlusion proximal to very small caliber 1st Mrg & AVG LCx. Difficult lesion to cross, but successful PTCA and DES PCI of the acutely occluded LCx using Synergy DES 2.5 mm x 20 mm-postdilated to 3.1-2.9 mm Mild proximal RCA and LAD disease. Mildly reduced EF of 45% with lateral hypokinesis. Sustained ventricular tachycardia related to angiography--s/p 1 shock DCCV restoring sinus rhythm    Recommendations: Restart postop medications but will reduce beta-blocker in half starting tonight. ->  Will need to determine appropriateness of home doses, but will dose other medications as of tomorrow. Continue home statin Cardiac rehab recommendation/consultation team to see the patient from hospital.   Echocardiogram 10/08/2019: Impressions:  1. Left ventricular ejection fraction, by estimation, is 40 to 45%. The  left ventricle has mildly decreased function. The left ventricle has no  regional wall motion abnormalities. There is moderate asymmetric left  ventricular hypertrophy of the  anteroseptum segment. Left ventricular diastolic parameters are consistent  with Grade I diastolic dysfunction (impaired relaxation). Diffuse mild  hypokinesis of the basal to mid segments, slightly worse in the  inferolateral myocardium. There is relative  sparing of the apical segments.   2. Right ventricular systolic function is normal. The right ventricular  size is  normal.   3. Right atrial size was moderately dilated.   4. The mitral valve is normal in structure. Mild mitral valve  regurgitation. No evidence of mitral stenosis.   5. The aortic valve is tricuspid. Aortic valve regurgitation is not  visualized. No aortic stenosis is present.   6. Aortic dilatation noted. There is mild dilatation of the ascending  aorta measuring  37 mm.   7. The inferior vena cava is normal in size with greater than 50%  respiratory variability, suggesting right atrial pressure of 3 mmHg.     02/2021 echo IMPRESSIONS     1. Left ventricular ejection fraction, by estimation, is 30 to 35%. The  left ventricle has moderately decreased function. The left ventricle  demonstrates regional wall motion abnormalities (see scoring  diagram/findings for description). The basal-to-mid   inferior wall is akinetic. The rest of the LV segments are hypokinetic.  The left ventricular internal cavity size was moderately dilated. There is  mild concentric left ventricular hypertrophy. Left ventricular diastolic  parameters are consistent with  Grade II diastolic dysfunction (pseudonormalization).   2. Right ventricular systolic function is normal. The right ventricular  size is normal. Tricuspid regurgitation signal is inadequate for assessing  PA pressure.   3. Left atrial size was severely dilated.   4. The mitral valve is normal in structure. Mild mitral valve  regurgitation.   5. The aortic valve is tricuspid. There is mild calcification of the  aortic valve. There is mild thickening of the aortic valve. Aortic valve  regurgitation is not visualized. Aortic valve sclerosis/calcification is  present, without any evidence of  aortic stenosis.   6. Aortic dilatation noted. There is mild dilatation of the aortic root,  measuring 38 mm.   7. The inferior vena cava is normal in size with greater than 50%  respiratory variability, suggesting right atrial pressure of 3 mmHg.    02/2021 RHC/LHC   Prox RCA lesion is 30% stenosed.   Ramus lesion is 30% stenosed.   LPAV lesion is 80% stenosed.   Non-stenotic Prox Cx to Dist Cx lesion with 99% stenosed side Reford Olliff in 1st Mrg was previously treated.   Non-stenotic Mid RCA to Dist RCA lesion was previously treated.   LV end diastolic pressure is normal.   Nonobstructive CAD. Prior stents in the RCA and LCx are patent.  Low LV filling pressures. PCWP 4 mm Hg, LVEDP 3 mm Hg Normal right heart pressures. PAP mean 11 mm Hg Normal cardiac output with index 2.76    Plan: will reduce lasix to every other day until seen back in office. Continue medical management.      05/2021 monitor 14 day monitor Rare supraventricular ectopy in the form of isolated PACs Frequent ventricular in the form of isolated PVCs (5.7%). Rare couplets and triplets. 9 runs of NSVT longest 6 beats Several patient triggered events however the presence of symptoms was not documented. Triggered events were correlated with sinus rhythm and PVCs and PACs     Patch Wear Time:  14 days and 0 hours (2023-01-31T13:41:31-0500 to 2023-02-14T13:41:35-0500)   Patient had a min HR of 50 bpm, max HR of 266 bpm, and avg HR of 83 bpm. Predominant underlying rhythm was Sinus Rhythm. 9 Ventricular Tachycardia runs occurred, the run with the fastest interval lasting 6 beats with a max rate of 266 bpm, the longest  lasting 4 beats with an avg rate of 127 bpm. Ventricular Tachycardia was detected within +/- 45 seconds of symptomatic patient event(s). Isolated SVEs were rare (<1.0%), and no SVE Couplets or SVE Triplets were present. Isolated VEs were frequent (5.7%,  [id number]), VE Couplets were rare (<1.0%, 4461), and VE Triplets were rare (<1.0%, 756). Ventricular Bigeminy and Trigeminy were present.        Assessment and Plan   1. CAD - no symptoms, continue current meds   2. Chronic  systolic HF - chronically poorly compliant with meds, improving with compliance but  still not conistent.  - if demonstrates compliance can consider further titration. We have not repeated echo or considred ICD as he is not taking his medications despite several conversations - continue current meds at this time. If increased compliance repeat echo next visit, pending results and compliance at that time consider aldactone and SGLT2i.    3. Palpitations/PVCs - doing well, continue current meds  4. HTN -at goal based on manual recheck, continue curren tmeds  5. HLD - LDL above goal of <55, change atorvastaitn to crestor 40mg  daily.   F/u 3 months  Antoine Poche, M.D.

## 2023-01-25 NOTE — Patient Instructions (Signed)
Medication Instructions:   Stop Atorvastatin (Lipitor) Begin Crestor 40mg  daily  Continue all other medications.     Labwork:  none  Testing/Procedures:  none  Follow-Up:  3 months   Any Other Special Instructions Will Be Listed Below (If Applicable).   If you need a refill on your cardiac medications before your next appointment, please call your pharmacy.

## 2023-04-04 ENCOUNTER — Ambulatory Visit (INDEPENDENT_AMBULATORY_CARE_PROVIDER_SITE_OTHER): Payer: Medicaid Other | Admitting: Internal Medicine

## 2023-04-04 ENCOUNTER — Encounter: Payer: Self-pay | Admitting: Internal Medicine

## 2023-04-04 VITALS — BP 138/86 | HR 80 | Ht 71.0 in | Wt 197.6 lb

## 2023-04-04 DIAGNOSIS — J449 Chronic obstructive pulmonary disease, unspecified: Secondary | ICD-10-CM | POA: Diagnosis not present

## 2023-04-04 DIAGNOSIS — I10 Essential (primary) hypertension: Secondary | ICD-10-CM

## 2023-04-04 DIAGNOSIS — F1029 Alcohol dependence with unspecified alcohol-induced disorder: Secondary | ICD-10-CM

## 2023-04-04 DIAGNOSIS — F331 Major depressive disorder, recurrent, moderate: Secondary | ICD-10-CM | POA: Diagnosis not present

## 2023-04-04 DIAGNOSIS — M5416 Radiculopathy, lumbar region: Secondary | ICD-10-CM | POA: Diagnosis not present

## 2023-04-04 DIAGNOSIS — I5042 Chronic combined systolic (congestive) and diastolic (congestive) heart failure: Secondary | ICD-10-CM

## 2023-04-04 MED ORDER — ENTRESTO 49-51 MG PO TABS: 1.0000 | ORAL_TABLET | Freq: Two times a day (BID) | ORAL | 3 refills | Status: AC

## 2023-04-04 MED ORDER — METHYLPREDNISOLONE 4 MG PO TBPK: ORAL_TABLET | ORAL | 0 refills | Status: DC

## 2023-04-04 MED ORDER — GABAPENTIN 100 MG PO CAPS: 100.0000 mg | ORAL_CAPSULE | Freq: Two times a day (BID) | ORAL | 3 refills | Status: AC

## 2023-04-04 NOTE — Progress Notes (Signed)
 Established Patient Office Visit  Subjective:  Patient ID: Timothy Walker, male    DOB: 1965-10-24  Age: 58 y.o. MRN: [id number]  CC:  Chief Complaint  Patient presents with   Hypertension    Follow up    Referral    Referral for depression    Cough    Deep cough , getting worse     HPI Timothy Walker is a 58 y.o. male with past medical history of CAD status post stent placement, HFrEF, HTN, COPD, GERD and tobacco abuse who presents for f/u of his chronic medical conditions.  CAD s/p stent placement, HFrEF and HTN:  His BP was elevated in the office today, but better compared to prior.  He reports improvement in chronic, intermittent bilateral leg swelling and hand swelling since starting Lasix .  He has been taking Entresto  49-51 mg QD, and metoprolol  100 mg QD for history of CAD and HTN. He denies any headache, dizziness or chest pain currently.  He has intermittent palpitations despite taking Metoprolol .  He takes about 2 shots of bourbon every 3-4 days. He was on aspirin  and statin currently for CAD.  COPD: He reports recent worsening of cough and dyspnea for the last 2 weeks. He uses albuterol  inhaler and nebulizer as needed for dyspnea or wheezing.  He has Trelegy, but compliance has been questionable.  He smokes about 0.5 pack/day.  GERD: He takes pantoprazole  for it.  MDD: He reports difficulty, insomnia, chronic fatigue and trouble with concentration.  He has tried multiple antidepressants, and did not like the effects of them.  He prefers to see psychiatrist. Referral was placed, but he did not schedule appointment.  Denies any SI or HI currently.  He has chronic left shoulder pain. Pain is constant, sharp, radiating towards the arm and worse with movement. Denies any numbness or tingling of the UE. He also c/o chronic low back pain, has h/o OA of knee.  He reports recent worsening of low back pain, constant, radiating to bilateral LE and had chronic, intermittent numbness of  the LE. He requests orthopedic surgery referral.  Denies any fall or injury recently.  Past Medical History:  Diagnosis Date   Cardiogenic shock (HCC) 01/13/2013   COPD (chronic obstructive pulmonary disease) (HCC)    Coronary artery disease    a. 12/2012 Inf STEMI/Cath/PCI: LM nl, LAD nl, D1 50ost, D2 min irregs, D3 small, LCX 80-1m, OM1/2/3 min irregs, RI 20p, RCA 50p/100d (3.5x18 Xience DEs),PDA/PL nl, EF 55% - complicated by VF/CGS/IABP/VDRF   GERD (gastroesophageal reflux disease)    Hiatal hernia    Hypertension    Ischemic cardiomyopathy    a. 12/2012 Echo: EF 45-50%, basal inf, inflat, mid inf HK, mildly reduced RV fxn.   Marijuana abuse    MI (myocardial infarction) (HCC) 2014   NSTEMI (non-ST elevated myocardial infarction) (HCC) 10/07/2019   Obstructive sleep apnea    ST elevation myocardial infarction (STEMI) of inferior wall, initial episode of care (HCC) 01/16/2013   Tobacco abuse    Ventricular fibrillation (HCC) 01/17/2013    Past Surgical History:  Procedure Laterality Date   COLONOSCOPY WITH PROPOFOL  N/A 05/14/2014   Dr. Shaaron: anal canal hemorrhoid, rectosigmoid hyerpplastic polyp, right-sided divetriculosis    CORONARY ANGIOPLASTY  01/13/2013   STENT TO RCA BY DR WONDA   CORONARY STENT INTERVENTION N/A 10/07/2019   Procedure: CORONARY STENT INTERVENTION;  Surgeon: Anner Alm ORN, MD;  Location: [facility] INVASIVE CV [facility];  Service: Cardiovascular;  Laterality: N/A;  CORONARY/GRAFT ACUTE MI REVASCULARIZATION N/A 10/07/2019   Procedure: Coronary/Graft Acute MI Revascularization;  Surgeon: Anner Alm ORN, MD;  Location: [facility] INVASIVE CV LAB;  Service: Cardiovascular;  Laterality: N/A;   DENTAL SURGERY     ESOPHAGEAL DILATION N/A 05/14/2014   Procedure: ESOPHAGEAL DILATION [address] MALONEY;  Surgeon: Lamar CHRISTELLA Hollingshead, MD;  Location: [facility];  Service: Endoscopy;  Laterality: N/A;   ESOPHAGOGASTRODUODENOSCOPY (EGD) WITH PROPOFOL  N/A 05/14/2014   Dr. Hollingshead: empiric dilation,  abnormal esophagus s/p biopsy (normal), small hiatal hernia   LEFT HEART CATH AND CORONARY ANGIOGRAPHY N/A 09/26/2018   Procedure: LEFT HEART CATH AND CORONARY ANGIOGRAPHY;  Surgeon: Court Dorn PARAS, MD;  Location: [facility] CV [facility];  Service: Cardiovascular;  Laterality: N/A;   LEFT HEART CATH AND CORONARY ANGIOGRAPHY N/A 10/07/2019   Procedure: LEFT HEART CATH AND CORONARY ANGIOGRAPHY;  Surgeon: Anner Alm ORN, MD;  Location: [facility] INVASIVE CV LAB;  Service: Cardiovascular;  Laterality: N/A;   LEFT HEART CATHETERIZATION WITH CORONARY ANGIOGRAM N/A 01/13/2013   Procedure: LEFT HEART CATHETERIZATION WITH CORONARY ANGIOGRAM;  Surgeon: Deatrice DELENA Cage, MD;  Location: [facility] CATH LAB;  Service: Cardiovascular;  Laterality: N/A;   NASAL SEPTUM SURGERY     PERCUTANEOUS CORONARY STENT INTERVENTION (PCI-S)  01/13/2013   Procedure: PERCUTANEOUS CORONARY STENT INTERVENTION (PCI-S);  Surgeon: Deatrice DELENA Cage, MD;  Location: [facility] CATH LAB;  Service: Cardiovascular;;   POLYPECTOMY  05/14/2014   Procedure: POLYPECTOMY;  Surgeon: Lamar CHRISTELLA Hollingshead, MD;  Location: [facility];  Service: Endoscopy;;   RIGHT/LEFT HEART CATH AND CORONARY ANGIOGRAPHY N/A 03/16/2021   Procedure: RIGHT/LEFT HEART CATH AND CORONARY ANGIOGRAPHY;  Surgeon: Jordan, Peter M, MD;  Location: [facility] INVASIVE CV LAB;  Service: Cardiovascular;  Laterality: N/A;    Family History  Problem Relation Age of Onset   Heart attack Other    Hypertension Other    Colon cancer Neg Hx     Social History   Socioeconomic History   Marital status: Divorced    Spouse name: Not on file   Number of children: Not on file   Years of education: Not on file   Highest education level: Not on file  Occupational History   Not on file  Tobacco Use   Smoking status: Every Day    Current packs/day: 0.75    Average packs/day: 0.8 packs/day for 41.4 years (31.0 ttl pk-yrs)    Types: Cigarettes    Start date: 11/21/1981   Smokeless tobacco: Never  Vaping Use   Vaping  status: Some Days   Substances: Nicotine , CBD  Substance and Sexual Activity   Alcohol use: Not Currently    Comment: 2-4 shots of burbon daily, if off work may be a little more   Drug use: Yes    Types: Marijuana    Comment: daily   Sexual activity: Yes  Other Topics Concern   Not on file  Social History Narrative   Not on file   Social Drivers of Health   Financial Resource Strain: Not on file  Food Insecurity: Not on file  Transportation Needs: Not on file  Physical Activity: Not on file  Stress: Not on file  Social Connections: Not on file  Intimate Partner Violence: Not on file    Outpatient Medications Prior to Visit  Medication Sig Dispense Refill   albuterol  (PROVENTIL ) (2.5 MG/3ML) 0.083% nebulizer solution Take 3 mLs (2.5 mg total) by nebulization every 2 (two) hours as needed for wheezing or shortness of breath. 75 mL 11  albuterol  (VENTOLIN  HFA) 108 (90 Base) MCG/ACT inhaler INHALE 2 PUFFS INTO THE LUNGS EVERY 4 HOURS AS NEEDED FOR WHEEZING OR SHORTNESS OF BREATH. 8.5 each 5   aspirin  EC 81 MG EC tablet Take 1 tablet (81 mg total) by mouth daily with breakfast. Swallow whole. 30 tablet 11   Fluticasone-Umeclidin-Vilant (TRELEGY ELLIPTA ) 100-62.5-25 MCG/ACT AEPB Inhale 1 puff into the lungs daily. 1 each 11   furosemide  (LASIX ) 20 MG tablet TAKE 1 TABLET BY MOUTH EVERY DAY 90 tablet 1   metoprolol  succinate (TOPROL  XL) 50 MG 24 hr tablet Take 1 tablet (50 mg total) by mouth daily. Take with 100 mg daily. Take with or immediately following a meal. 90 tablet 2   metoprolol  succinate (TOPROL -XL) 100 MG 24 hr tablet Take 1 tablet (100 mg total) by mouth daily. Take with 50 mg for a total of 150 mg. Take with or immediately following a meal. 90 tablet 2   nitroGLYCERIN  (NITROSTAT ) 0.4 MG SL tablet Place 0.4 mg under the tongue every 5 (five) minutes x 3 doses as needed for chest pain (if no relief after 2nd dose, proceed to ED or call 911).     pantoprazole  (PROTONIX ) 40 MG  tablet Take 1 tablet (40 mg total) by mouth daily. 30 tablet 5   potassium chloride  (KLOR-CON ) 10 MEQ tablet Take 1 tablet (10 mEq total) by mouth daily. With Lasix . 90 tablet 1   promethazine -dextromethorphan (PROMETHAZINE -DM) 6.25-15 MG/5ML syrup Take 5 mLs by mouth 4 (four) times daily as needed for cough. 118 mL 0   rosuvastatin  (CRESTOR ) 40 MG tablet Take 1 tablet (40 mg total) by mouth daily. 30 tablet 6   sildenafil  (VIAGRA ) 50 MG tablet Take 1 tablet (50 mg total) by mouth daily as needed for erectile dysfunction. 10 tablet 2   sacubitril-valsartan  (ENTRESTO ) 49-51 MG Take 1 tablet by mouth 2 (two) times daily. 60 tablet 1   No facility-administered medications prior to visit.    No Known Allergies  ROS Review of Systems  Constitutional:  Negative for chills and fever.  HENT:  Negative for congestion and sore throat.   Eyes:  Negative for pain and discharge.  Respiratory:  Positive for cough and shortness of breath.   Cardiovascular:  Positive for palpitations. Negative for chest pain.  Gastrointestinal:  Negative for diarrhea, nausea and vomiting.  Endocrine: Negative for polydipsia and polyuria.  Genitourinary:  Negative for dysuria and hematuria.  Musculoskeletal:  Positive for arthralgias and back pain. Negative for neck pain and neck stiffness.  Skin:  Negative for rash.  Neurological:  Negative for dizziness, weakness, numbness and headaches.  Psychiatric/Behavioral:  Positive for dysphoric mood and sleep disturbance. Negative for agitation and behavioral problems. The patient is nervous/anxious.       Objective:    Physical Exam Vitals reviewed.  Constitutional:      General: He is not in acute distress.    Appearance: He is not diaphoretic.  HENT:     Head: Normocephalic and atraumatic.     Nose: Nose normal.     Mouth/Throat:     Mouth: Mucous membranes are moist.  Eyes:     General: No scleral icterus.    Extraocular Movements: Extraocular movements  intact.  Cardiovascular:     Rate and Rhythm: Normal rate and regular rhythm.     Heart sounds: Normal heart sounds. No murmur heard. Pulmonary:     Breath sounds: Normal breath sounds. No wheezing or rales.  Musculoskeletal:  Cervical back: Neck supple. No tenderness.     Lumbar back: Tenderness present. Decreased range of motion.     Right lower leg: No edema.     Left lower leg: No edema.  Skin:    General: Skin is warm.     Findings: No rash.  Neurological:     General: No focal deficit present.     Mental Status: He is alert and oriented to person, place, and time.  Psychiatric:        Mood and Affect: Mood is depressed.        Behavior: Behavior is cooperative.     BP 138/86 (BP Location: Left Arm)   Pulse 80   Ht 5' 11 (1.803 m)   Wt 197 lb 9.6 oz (89.6 kg)   SpO2 96%   BMI 27.56 kg/m  Wt Readings from Last 3 Encounters:  04/04/23 197 lb 9.6 oz (89.6 kg)  01/25/23 195 lb 9.6 oz (88.7 kg)  11/29/22 194 lb (88 kg)    Lab Results  Component Value Date   TSH 2.270 11/10/2022   Lab Results  Component Value Date   WBC 9.3 11/10/2022   HGB 15.1 11/10/2022   HCT 44.0 11/10/2022   MCV 100 (H) 11/10/2022   PLT 289 11/10/2022   Lab Results  Component Value Date   NA 140 11/10/2022   K 4.4 11/10/2022   CO2 20 11/10/2022   GLUCOSE 103 (H) 11/10/2022   BUN 8 11/10/2022   CREATININE 0.84 11/10/2022   BILITOT 0.4 03/09/2021   ALKPHOS 63 03/09/2021   AST 28 03/09/2021   ALT 18 03/09/2021   PROT 7.8 03/09/2021   ALBUMIN 4.1 03/09/2021   CALCIUM  9.9 11/10/2022   ANIONGAP 10 03/14/2021   EGFR 103 11/10/2022   Lab Results  Component Value Date   CHOL 157 11/10/2022   Lab Results  Component Value Date   HDL 39 (L) 11/10/2022   Lab Results  Component Value Date   LDLCALC 88 11/10/2022   Lab Results  Component Value Date   TRIG 177 (H) 11/10/2022   Lab Results  Component Value Date   CHOLHDL 4.0 11/10/2022   Lab Results  Component Value Date    HGBA1C 5.9 (H) 11/10/2022      Assessment & Plan:   Problem List Items Addressed This Visit       Cardiovascular and Mediastinum   Essential hypertension - Primary (Chronic)   BP Readings from Last 1 Encounters:  04/04/23 138/86   uncontrolled due to noncompliance Refilled Entresto  49-51 mg 1 tablet BID and metoprolol  150 mg QD Did not tolerate Entresto  in the past, but agrees to take it for CHF -advised to discuss with cardiology Counseled for compliance with the medications Advised DASH diet and moderate exercise/walking, at least 150 mins/week      Relevant Medications   sacubitril-valsartan  (ENTRESTO ) 49-51 MG   Chronic combined systolic and diastolic heart failure (HCC)   History of ischemic cardiomyopathy Has chronic leg swelling, Lasix  20 mg QD for now On metoprolol , refilled 150 mg QD Increased dose of Entresto  to 49-51 mg BID in the last visit, discussed about its potential benefits - he agrees to take it regularly Followed by cardiology      Relevant Medications   sacubitril-valsartan  (ENTRESTO ) 49-51 MG     Respiratory   COPD (chronic obstructive pulmonary disease) (HCC)   Recent worsening of cough and dyspnea likely due to COPD exacerbation Started Medrol  Dosepak Usually well-controlled with  Trelegy as needed albuterol  He also uses albuterol  neb as needed for dyspnea or wheezing Promethazine  DM syrup as needed for cough Needs to cut down -> quit smoking      Relevant Medications   methylPREDNISolone  (MEDROL  DOSEPAK) 4 MG TBPK tablet     Nervous and Auditory   Lumbar radiculopathy   Low back pain with radicular symptoms Started gabapentin  100 mg twice daily Avoid heavy lifting and frequent bending Referred to orthopedic surgery      Relevant Medications   gabapentin  (NEURONTIN ) 100 MG capsule   Other Relevant Orders   Ambulatory referral to Orthopedic Surgery     Other   Moderate episode of recurrent major depressive disorder (HCC) (Chronic)    Uncontrolled Has tried SSRIs in the past, but did not like the effects Referred to psychiatry as per patient request - but he did not follow up      Relevant Orders   Ambulatory referral to Psychiatry   Alcohol dependence (HCC)   Takes about 3-4 drinks (bourbon) on some days in a week Needs to cut down alcohol use as it is worsening his CHF and MDD         Meds ordered this encounter  Medications   gabapentin  (NEURONTIN ) 100 MG capsule    Sig: Take 1 capsule (100 mg total) by mouth 2 (two) times daily.    Dispense:  60 capsule    Refill:  3   sacubitril-valsartan  (ENTRESTO ) 49-51 MG    Sig: Take 1 tablet by mouth 2 (two) times daily.    Dispense:  60 tablet    Refill:  3   methylPREDNISolone  (MEDROL  DOSEPAK) 4 MG TBPK tablet    Sig: Take as package instructions.    Dispense:  1 each    Refill:  0    Follow-up: Return in about 3 months (around 07/03/2023) for HTN.    Suzzane MARLA Blanch, MD

## 2023-04-04 NOTE — Patient Instructions (Addendum)
 Please start taking Prednisone as prescribed.  Please start taking Gabapentin as prescribed for back pain.  Please continue to take other medications as prescribed.  Please continue to follow low salt diet and ambulate as tolerated.

## 2023-04-04 NOTE — Assessment & Plan Note (Addendum)
 Recent worsening of cough and dyspnea likely due to COPD exacerbation Started Medrol  Dosepak Usually well-controlled with Trelegy as needed albuterol  He also uses albuterol  neb as needed for dyspnea or wheezing Promethazine  DM syrup as needed for cough Needs to cut down -> quit smoking

## 2023-04-04 NOTE — Assessment & Plan Note (Signed)
Uncontrolled Has tried SSRIs in the past, but did not like the effects Referred to psychiatry as per patient request - but he did not follow up

## 2023-04-05 NOTE — Assessment & Plan Note (Signed)
Takes about 3-4 drinks (bourbon) on some days in a week Needs to cut down alcohol use as it is worsening his CHF and MDD

## 2023-04-05 NOTE — Assessment & Plan Note (Signed)
 History of ischemic cardiomyopathy Has chronic leg swelling, Lasix  20 mg QD for now On metoprolol , refilled 150 mg QD Increased dose of Entresto  to 49-51 mg BID in the last visit, discussed about its potential benefits - he agrees to take it regularly Followed by cardiology

## 2023-04-05 NOTE — Assessment & Plan Note (Signed)
 BP Readings from Last 1 Encounters:  04/04/23 138/86   uncontrolled due to noncompliance Refilled Entresto  49-51 mg 1 tablet BID and metoprolol  150 mg QD Did not tolerate Entresto  in the past, but agrees to take it for CHF -advised to discuss with cardiology Counseled for compliance with the medications Advised DASH diet and moderate exercise/walking, at least 150 mins/week

## 2023-04-05 NOTE — Assessment & Plan Note (Signed)
 Low back pain with radicular symptoms Started gabapentin 100 mg twice daily Avoid heavy lifting and frequent bending Referred to orthopedic surgery

## 2023-04-11 ENCOUNTER — Ambulatory Visit: Payer: Medicaid Other | Admitting: Physical Medicine and Rehabilitation

## 2023-05-09 ENCOUNTER — Ambulatory Visit: Payer: Medicaid Other | Attending: Cardiology | Admitting: Cardiology

## 2023-05-09 ENCOUNTER — Encounter: Payer: Self-pay | Admitting: Cardiology

## 2023-05-09 VITALS — BP 120/88 | HR 80 | Ht 71.0 in | Wt 189.4 lb

## 2023-05-09 DIAGNOSIS — Z79899 Other long term (current) drug therapy: Secondary | ICD-10-CM

## 2023-05-09 DIAGNOSIS — I5022 Chronic systolic (congestive) heart failure: Secondary | ICD-10-CM | POA: Diagnosis not present

## 2023-05-09 DIAGNOSIS — I2510 Atherosclerotic heart disease of native coronary artery without angina pectoris: Secondary | ICD-10-CM

## 2023-05-09 MED ORDER — SPIRONOLACTONE 25 MG PO TABS: 12.5000 mg | ORAL_TABLET | Freq: Every day | ORAL | 6 refills | Status: AC

## 2023-05-09 NOTE — Progress Notes (Signed)
Clinical Summary Timothy Walker is a 58 y.o.male seen today for follow up of the following medical problems.      1. CAD   - admit to [facility] 01/13/13 to 01/17/13 with STEMI, DES to RCA.   - patient presented in cardiogenic shock, suffered VT/VF arrest requiring multiple defibrillations and IV amiodarone. Following PCI required balloon pump and vasopressors and temporary transvenous pacing.   - Echo LVEF 45-50%, inferior hypokinesis, normal diastolic function        09/2018 cath mid LAD 30%, ramus 30%, LCX mid 90% small nondom and stable, RCA 30% prox. 09/2018 echo LVEF 45-50%, grade I diastolic dysfunction, basal mid inferior akinesis.      09/2019 admitted with NSTEMI - had DES to LCX, during procedure VT requiring defib - Echo showed LVEF of 40-45%     02/2021 cath: nonobstructive CAD, patent stents   - no chest pains, chronic SOB/DOE. No recent edema - mixed compliance with meds but improving.        2. Chronic systolic HF 09/2019 echo: 40-45% 02/2021 echo LVEF 30-35%  02/2021 cath: nonobstructive CAD, patent stents   - entresto caused dizziness, last visit we tried lowering dose to 24/26mg  bid Often misses second entresto dose, otherwise consistent.       Other medical problems not addressed this visit.   3. HTN   -not compliant with meds   4. Hyperlipidemia   - not compliant with statin 10/2022 TC 157 TG 177 HDL 39 LDL 88   5. COPD - abnormal PFTs in 2015 - mixed compliance with inhalers.  - pcp started trelegy just today   6. PVCs - long history of PVCs - frequent PVCs noted during recent admission, beta blocker titrated up - ongoing symptoms of palpitations, heart fluttering with some chest discomfort   - 05/2021 monitor frequent PVCs (5.7% burden), 9 runs of NSVT lognest 6 beats - ongoing symptoms, daily.  - cutting back on EtOH, cigarette   - palpitations are improving, short infrequent episodes.    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 12/2012 Inf STEMI/Cath/PCI: LM nl, LAD nl, D1 50ost, D2 min irregs, D3 small, LCX 80-19m, OM1/2/3 min irregs, RI 20p, RCA 50p/100d (3.5x18 Xience DEs),PDA/PL nl, EF 55% - complicated by VF/CGS/IABP/VDRF   GERD (gastroesophageal reflux disease)    Hiatal hernia    Hypertension    Ischemic cardiomyopathy    a. 12/2012 Echo: EF 45-50%, basal inf, inflat, mid inf HK, mildly reduced RV fxn.   Marijuana abuse    MI (myocardial infarction) (HCC) 2014   NSTEMI (non-ST elevated myocardial infarction) (HCC) 10/07/2019   Obstructive sleep apnea    ST elevation myocardial infarction (STEMI) of inferior wall, initial episode of care (HCC) 01/16/2013   Tobacco abuse    Ventricular fibrillation (HCC) 01/17/2013     No Known Allergies   Current Outpatient Medications  Medication Sig Dispense Refill   albuterol (PROVENTIL) (2.5 MG/3ML) 0.083% nebulizer solution Take 3 mLs (2.5 mg total) by nebulization every 2 (two) hours as needed for wheezing or shortness of breath. 75 mL 11   albuterol (VENTOLIN HFA) 108 (90 Base) MCG/ACT inhaler INHALE 2 PUFFS INTO THE LUNGS EVERY 4 HOURS AS NEEDED FOR WHEEZING OR SHORTNESS OF BREATH. 8.5 each 5   aspirin EC  81 MG EC tablet Take 1 tablet (81 mg total) by mouth daily with breakfast. Swallow whole. 30 tablet 11   Fluticasone-Umeclidin-Vilant (TRELEGY ELLIPTA) 100-62.5-25 MCG/ACT AEPB Inhale 1 puff into the lungs daily. 1 each 11   furosemide (LASIX) 20 MG tablet TAKE 1 TABLET BY MOUTH EVERY DAY 90 tablet 1   gabapentin (NEURONTIN) 100 MG capsule Take 1 capsule (100 mg total) by mouth 2 (two) times daily. 60 capsule 3   methylPREDNISolone (MEDROL DOSEPAK) 4 MG TBPK tablet Take as package instructions. 1 each 0   metoprolol succinate (TOPROL XL) 50 MG 24 hr tablet Take 1 tablet (50 mg total) by mouth  daily. Take with 100 mg daily. Take with or immediately following a meal. 90 tablet 2   metoprolol succinate (TOPROL-XL) 100 MG 24 hr tablet Take 1 tablet (100 mg total) by mouth daily. Take with 50 mg for a total of 150 mg. Take with or immediately following a meal. 90 tablet 2   nitroGLYCERIN (NITROSTAT) 0.4 MG SL tablet Place 0.4 mg under the tongue every 5 (five) minutes x 3 doses as needed for chest pain (if no relief after 2nd dose, proceed to ED or call 911).     pantoprazole (PROTONIX) 40 MG tablet Take 1 tablet (40 mg total) by mouth daily. 30 tablet 5   potassium chloride (KLOR-CON) 10 MEQ tablet Take 1 tablet (10 mEq total) by mouth daily. With Lasix. 90 tablet 1   promethazine-dextromethorphan (PROMETHAZINE-DM) 6.25-15 MG/5ML syrup Take 5 mLs by mouth 4 (four) times daily as needed for cough. 118 mL 0   rosuvastatin (CRESTOR) 40 MG tablet Take 1 tablet (40 mg total) by mouth daily. 30 tablet 6   sacubitril-valsartan (ENTRESTO) 49-51 MG Take 1 tablet by mouth 2 (two) times daily. 60 tablet 3   sildenafil (VIAGRA) 50 MG tablet Take 1 tablet (50 mg total) by mouth daily as needed for erectile dysfunction. 10 tablet 2   No current facility-administered medications for this visit.     Past Surgical History:  Procedure Laterality Date   COLONOSCOPY WITH PROPOFOL N/A 05/14/2014   Dr. Jena Gauss: anal canal hemorrhoid, rectosigmoid hyerpplastic polyp, right-sided divetriculosis    CORONARY ANGIOPLASTY  01/13/2013   STENT TO RCA BY DR Excell Seltzer   CORONARY STENT INTERVENTION N/A 10/07/2019   Procedure: CORONARY STENT INTERVENTION;  Surgeon: Marykay Lex, MD;  Location: [facility] INVASIVE CV LAB;  Service: Cardiovascular;  Laterality: N/A;   CORONARY/GRAFT ACUTE MI REVASCULARIZATION N/A 10/07/2019   Procedure: Coronary/Graft Acute MI Revascularization;  Surgeon: Marykay Lex, MD;  Location: [facility] INVASIVE CV LAB;  Service: Cardiovascular;  Laterality: N/A;   DENTAL SURGERY     ESOPHAGEAL DILATION N/A  05/14/2014   Procedure: ESOPHAGEAL DILATION 56 FRENCH MALONEY;  Surgeon: Corbin Ade, MD;  Location: [facility] ORS;  Service: Endoscopy;  Laterality: N/A;   ESOPHAGOGASTRODUODENOSCOPY (EGD) WITH PROPOFOL N/A 05/14/2014   Dr. Jena Gauss: empiric dilation, abnormal esophagus s/p biopsy (normal), small hiatal hernia   LEFT HEART CATH AND CORONARY ANGIOGRAPHY N/A 09/26/2018   Procedure: LEFT HEART CATH AND CORONARY ANGIOGRAPHY;  Surgeon: Runell Gess, MD;  Location: [facility] INVASIVE CV LAB;  Service: Cardiovascular;  Laterality: N/A;   LEFT HEART CATH AND CORONARY ANGIOGRAPHY N/A 10/07/2019   Procedure: LEFT HEART CATH AND CORONARY ANGIOGRAPHY;  Surgeon: Marykay Lex, MD;  Location: [facility] INVASIVE CV LAB;  Service: Cardiovascular;  Laterality: N/A;   LEFT HEART CATHETERIZATION WITH CORONARY ANGIOGRAM N/A 01/13/2013   Procedure: LEFT HEART CATHETERIZATION  WITH CORONARY ANGIOGRAM;  Surgeon: Iran Ouch, MD;  Location[facility] CATH LAB;  Service: Cardiovascular;  Laterality: N/A;   NASAL SEPTUM SURGERY     PERCUTANEOUS CORONARY STENT INTERVENTION (PCI-S)  01/13/2013   Procedure: PERCUTANEOUS CORONARY STENT INTERVENTION (PCI-S);  Surgeon: Iran Ouch, MD;  Location: [facility] CATH LAB;  Service: Cardiovascular;;   POLYPECTOMY  05/14/2014   Procedure: POLYPECTOMY;  Surgeon: Corbin Ade, MD;  Location: [facility] ORS;  Service: Endoscopy;;   RIGHT/LEFT HEART CATH AND CORONARY ANGIOGRAPHY N/A 03/16/2021   Procedure: RIGHT/LEFT HEART CATH AND CORONARY ANGIOGRAPHY;  Surgeon: Swaziland, Peter M, MD;  Location: [facility] INVASIVE CV LAB;  Service: Cardiovascular;  Laterality: N/A;     No Known Allergies    Family History  Problem Relation Age of Onset   Heart attack Other    Hypertension Other    Colon cancer Neg Hx      Social History Timothy Walker reports that he has been smoking cigarettes. He started smoking about 41 years ago. He has a 31.1 pack-year smoking history. He has never used smokeless tobacco. Mr. Roper reports  that he does not currently use alcohol.      Physical Examination Today's Vitals   05/09/23 1502  BP: 120/88  Pulse: 80  SpO2: 98%  Weight: 189 lb 6.4 oz (85.9 kg)  Height: 5\' 11"  (1.803 m)   Body mass index is 26.42 kg/m.  Gen: resting comfortably, no acute distress HEENT: no scleral icterus, pupils equal round and reactive, no palptable cervical adenopathy,  CV: RRR, no mrg no jvd Resp: Clear to auscultation bilaterally GI: abdomen is soft, non-tender, non-distended, normal bowel sounds, no hepatosplenomegaly MSK: extremities are warm, no edema.  Skin: warm, no rash Neuro:  no focal deficits Psych: appropriate affect   Diagnostic Studies  09/2018 echo IMPRESSIONS      1. The left ventricle has mildly reduced systolic function, with an ejection fraction of 45-50%. The cavity size was mildly dilated. There is mildly increased left ventricular wall thickness. Left ventricular diastolic Doppler parameters are consistent with impaired relaxation.  2. There is akinesis of the left ventricular, basal-mid inferior wall.  3. The right ventricle has normal systolic function. The cavity was normal. There is no increase in right ventricular wall thickness. Right ventricular systolic pressure is mildly elevated with an estimated pressure of 39.8 mmHg.  4. The mitral valve is grossly normal. There is mild mitral regurgitation.  5. The tricuspid valve is grossly normal.  6. The aortic valve is tricuspid.  7. The aortic root is normal in size and structure.  8. The inferior vena cava was dilated in size with >50% respiratory variability.   09/2018 cath Previously placed Mid RCA to Dist RCA stent (unknown type) is widely patent. Mid Cx lesion is 90% stenosed. Mid LAD lesion is 30% stenosed. Prox RCA lesion is 30% stenosed. Ramus lesion is 30% stenosed. There is moderate left ventricular systolic dysfunction. LV end diastolic pressure is mildly elevated. The left ventricular  ejection fraction is 35-45% by visual estimate.   JACHIN COURY is a 58 y.o. male IMPRESSION: Mr. Hinzman distal dominant RCA stent was widely patent.  He had high-grade lesion in a nondominant circumflex similar as described at the cath in 2014.  He had moderate LV dysfunction with an EF in the 40% range and severe inferior hypokinesia.  I do not see a culprit vessel new since his previous cath.  He does have moderate LV dysfunction and medical therapy  will will be recommended for this.  The sheath was removed and a TR band was placed on the right wrist to achieve patent hemostasis.  The patient left the lab in stable condition.  He can be discharged home later today with TOC 7 followed by return office visit with Dr. Wyline Mood in 3 to 4 weeks.   09/2019 cath Left Heart Catheterization 10/07/2019: There is mild left ventricular systolic dysfunction. The left ventricular ejection fraction is 45-50% by visual LV end diastolic pressure is normal. Coronary anatomy: Prox RCA lesion is 30% stenosed. Previously placed Mid RCA to Dist RCA stent (unknown type) is widely patent. Ramus lesion is 30% stenosed. Prox Cx to Dist Cx lesion is 100% stenosed with 99% stenosed side Tyasia Packard in 1st Mrg. LPAV lesion is 80% stenosed. A drug-eluting stent was successfully placed crossing from the small 1st Mrg into the LCx-2nd Mrg, using a SYNERGY XD 2.50X20. Post intervention, there is a 0% residual stenosis. Post intervention, the side Pawel Soules was reduced to 99% residual stenosis. A drug-eluting stent was successfully placed using a SYNERGY XD 2.50X20.   Summary: Two-Vessel Disease: With widely patent distal RCA stent and Toprol lesion being 100% proximal to mid LCx occlusion proximal to very small caliber 1st Mrg & AVG LCx. Difficult lesion to cross, but successful PTCA and DES PCI of the acutely occluded LCx using Synergy DES 2.5 mm x 20 mm-postdilated to 3.1-2.9 mm Mild proximal RCA and LAD disease. Mildly reduced EF  of 45% with lateral hypokinesis. Sustained ventricular tachycardia related to angiography--s/p 1 shock DCCV restoring sinus rhythm    Recommendations: Restart postop medications but will reduce beta-blocker in half starting tonight. ->  Will need to determine appropriateness of home doses, but will dose other medications as of tomorrow. Continue home statin Cardiac rehab recommendation/consultation team to see the patient from hospital.   Echocardiogram 10/08/2019: Impressions:  1. Left ventricular ejection fraction, by estimation, is 40 to 45%. The  left ventricle has mildly decreased function. The left ventricle has no  regional wall motion abnormalities. There is moderate asymmetric left  ventricular hypertrophy of the  anteroseptum segment. Left ventricular diastolic parameters are consistent  with Grade I diastolic dysfunction (impaired relaxation). Diffuse mild  hypokinesis of the basal to mid segments, slightly worse in the  inferolateral myocardium. There is relative  sparing of the apical segments.   2. Right ventricular systolic function is normal. The right ventricular  size is normal.   3. Right atrial size was moderately dilated.   4. The mitral valve is normal in structure. Mild mitral valve  regurgitation. No evidence of mitral stenosis.   5. The aortic valve is tricuspid. Aortic valve regurgitation is not  visualized. No aortic stenosis is present.   6. Aortic dilatation noted. There is mild dilatation of the ascending  aorta measuring 37 mm.   7. The inferior vena cava is normal in size with greater than 50%  respiratory variability, suggesting right atrial pressure of 3 mmHg.     02/2021 echo IMPRESSIONS     1. Left ventricular ejection fraction, by estimation, is 30 to 35%. The  left ventricle has moderately decreased function. The left ventricle  demonstrates regional wall motion abnormalities (see scoring  diagram/findings for description). The basal-to-mid    inferior wall is akinetic. The rest of the LV segments are hypokinetic.  The left ventricular internal cavity size was moderately dilated. There is  mild concentric left ventricular hypertrophy. Left ventricular diastolic  parameters are consistent  with  Grade II diastolic dysfunction (pseudonormalization).   2. Right ventricular systolic function is normal. The right ventricular  size is normal. Tricuspid regurgitation signal is inadequate for assessing  PA pressure.   3. Left atrial size was severely dilated.   4. The mitral valve is normal in structure. Mild mitral valve  regurgitation.   5. The aortic valve is tricuspid. There is mild calcification of the  aortic valve. There is mild thickening of the aortic valve. Aortic valve  regurgitation is not visualized. Aortic valve sclerosis/calcification is  present, without any evidence of  aortic stenosis.   6. Aortic dilatation noted. There is mild dilatation of the aortic root,  measuring 38 mm.   7. The inferior vena cava is normal in size with greater than 50%  respiratory variability, suggesting right atrial pressure of 3 mmHg.   02/2021 RHC/LHC   Prox RCA lesion is 30% stenosed.   Ramus lesion is 30% stenosed.   LPAV lesion is 80% stenosed.   Non-stenotic Prox Cx to Dist Cx lesion with 99% stenosed side Islay Polanco in 1st Mrg was previously treated.   Non-stenotic Mid RCA to Dist RCA lesion was previously treated.   LV end diastolic pressure is normal.   Nonobstructive CAD. Prior stents in the RCA and LCx are patent.  Low LV filling pressures. PCWP 4 mm Hg, LVEDP 3 mm Hg Normal right heart pressures. PAP mean 11 mm Hg Normal cardiac output with index 2.76    Plan: will reduce lasix to every other day until seen back in office. Continue medical management.      05/2021 monitor 14 day monitor Rare supraventricular ectopy in the form of isolated PACs Frequent ventricular in the form of isolated PVCs (5.7%). Rare couplets and  triplets. 9 runs of NSVT longest 6 beats Several patient triggered events however the presence of symptoms was not documented. Triggered events were correlated with sinus rhythm and PVCs and PACs     Patch Wear Time:  14 days and 0 hours (2023-01-31T13:41:31-0500 to 2023-02-14T13:41:35-0500)   Patient had a min HR of 50 bpm, max HR of 266 bpm, and avg HR of 83 bpm. Predominant underlying rhythm was Sinus Rhythm. 9 Ventricular Tachycardia runs occurred, the run with the fastest interval lasting 6 beats with a max rate of 266 bpm, the longest  lasting 4 beats with an avg rate of 127 bpm. Ventricular Tachycardia was detected within +/- 45 seconds of symptomatic patient event(s). Isolated SVEs were rare (<1.0%), and no SVE Couplets or SVE Triplets were present. Isolated VEs were frequent (5.7%,  95317), VE Couplets were rare (<1.0%, 4461), and VE Triplets were rare (<1.0%, 756). Ventricular Bigeminy and Trigeminy were present.      Assessment and Plan    1. CAD - denies symptoms - encouraged increased compliance with meds   2. Chronic systolic HF - chronically poorly compliant with meds, improving with compliance but still not conistent.  - if demonstrates compliance can consider further titration. We have not repeated echo or considred ICD as he is not taking his medications despite several conversations - will add aldactone 12.5mg  daily, if compliante consider SGLT2i at next appt. BMET/Mg in 3 weeks - euvolemic without symptoms   F/u 6 weeks      Antoine Poche, M.D.

## 2023-05-09 NOTE — Patient Instructions (Signed)
Medication Instructions:   Begin Aldactone 12.5mg  daily Continue all other medications.     Labwork:  BMET - order given  Please do in 3 weeks Office will contact with results via phone, letter or mychart.     Testing/Procedures:  none  Follow-Up:  6 weeks   Any Other Special Instructions Will Be Listed Below (If Applicable).   If you need a refill on your cardiac medications before your next appointment, please call your pharmacy.

## 2023-05-11 ENCOUNTER — Telehealth: Payer: Self-pay | Admitting: Cardiology

## 2023-05-11 NOTE — Telephone Encounter (Signed)
[name] walked into the office today stating that he got his disability but [place] Medicaid program dropped him. He has no insurance to get his medications. I gave [name] the information to the [employer] that sells insurance until he gets his Medicare next year. He is going to check with Dr. Allena Katz also about samples of medications that Dr. Allena Katz gives him. He was wanting to know if the medications Dr.Branch writes for him would we have samples to give out or any other suggestions about how to get his medications.

## 2023-05-15 NOTE — Telephone Encounter (Signed)
 Left a message for patient to call office back regarding medication samples.    (Please route to Ross Marcus., CMA at [place] office)

## 2023-05-22 NOTE — Telephone Encounter (Signed)
 Multiple attempts to reach patient to address medication issues leaving messages to return call. Will close note.

## 2023-06-26 ENCOUNTER — Ambulatory Visit: Payer: Medicaid Other | Admitting: Nurse Practitioner

## 2023-07-04 ENCOUNTER — Ambulatory Visit: Payer: Medicaid Other | Admitting: Internal Medicine

## 2023-07-30 ENCOUNTER — Other Ambulatory Visit: Payer: Self-pay | Admitting: Cardiology

## 2023-08-19 ENCOUNTER — Other Ambulatory Visit: Payer: Self-pay | Admitting: Internal Medicine

## 2023-08-19 DIAGNOSIS — J449 Chronic obstructive pulmonary disease, unspecified: Secondary | ICD-10-CM

## 2023-11-03 ENCOUNTER — Other Ambulatory Visit: Payer: Self-pay | Admitting: Internal Medicine

## 2023-11-03 ENCOUNTER — Other Ambulatory Visit: Payer: Self-pay | Admitting: Cardiology

## 2023-11-03 DIAGNOSIS — I5042 Chronic combined systolic (congestive) and diastolic (congestive) heart failure: Secondary | ICD-10-CM

## 2023-11-03 DIAGNOSIS — J449 Chronic obstructive pulmonary disease, unspecified: Secondary | ICD-10-CM

## 2023-12-08 ENCOUNTER — Other Ambulatory Visit: Payer: Self-pay | Admitting: Cardiology

## 2023-12-08 DIAGNOSIS — I5042 Chronic combined systolic (congestive) and diastolic (congestive) heart failure: Secondary | ICD-10-CM

## 2023-12-25 ENCOUNTER — Other Ambulatory Visit: Payer: Self-pay | Admitting: Internal Medicine

## 2023-12-25 DIAGNOSIS — J449 Chronic obstructive pulmonary disease, unspecified: Secondary | ICD-10-CM

## 2024-04-02 ENCOUNTER — Other Ambulatory Visit: Payer: Self-pay | Admitting: Internal Medicine

## 2024-04-02 ENCOUNTER — Telehealth: Payer: Self-pay

## 2024-04-02 DIAGNOSIS — I5042 Chronic combined systolic (congestive) and diastolic (congestive) heart failure: Secondary | ICD-10-CM

## 2024-04-02 NOTE — Telephone Encounter (Signed)
 Copied from CRM [id number]. Topic: Clinical - Medication Question >> Apr 02, 2024  4:28 PM Timothy Walker wrote: Reason for CRM: Patient calling in to see if there is any way for med, furosemide  (LASIX ) 20 MG tablet, to be prescribed without him having to come in to see provider, patient stated that he does not feel he needs to see the provider due to the med not being a controlled substance, patient stated that he does want to see provider but he is unable to at this time due to not having ins, is req a call back in regards to this.

## 2024-04-03 ENCOUNTER — Other Ambulatory Visit: Payer: Self-pay | Admitting: Internal Medicine

## 2024-04-03 DIAGNOSIS — I5042 Chronic combined systolic (congestive) and diastolic (congestive) heart failure: Secondary | ICD-10-CM

## 2024-04-03 MED ORDER — FUROSEMIDE 20 MG PO TABS: 20.0000 mg | ORAL_TABLET | Freq: Every day | ORAL | 1 refills | Status: AC

## 2024-04-03 NOTE — Telephone Encounter (Signed)
 States he is out of insurance at this time will call back to schedule an appt.
